# Patient Record
Sex: Female | Born: 1945
Health system: Southern US, Community
[De-identification: ages and names within clinical notes are randomized; demographics above are authoritative.]

## PROBLEM LIST (undated history)

## (undated) DIAGNOSIS — I10 Essential (primary) hypertension: Secondary | ICD-10-CM

## (undated) DIAGNOSIS — N189 Chronic kidney disease, unspecified: Secondary | ICD-10-CM

## (undated) DIAGNOSIS — B029 Zoster without complications: Secondary | ICD-10-CM

## (undated) DIAGNOSIS — K219 Gastro-esophageal reflux disease without esophagitis: Secondary | ICD-10-CM

## (undated) DIAGNOSIS — M1990 Unspecified osteoarthritis, unspecified site: Secondary | ICD-10-CM

## (undated) HISTORY — DX: Zoster without complications: B02.9

## (undated) HISTORY — PX: DIAGNOSTIC LAPAROSCOPY: SUR761

## (undated) HISTORY — PX: TUBAL LIGATION: SHX77

---

## 2001-07-01 ENCOUNTER — Emergency Department: Admission: EM | Admit: 2001-07-01 | Discharge: 2001-07-01 | Payer: Self-pay | Admitting: Emergency Medicine

## 2003-07-05 ENCOUNTER — Encounter: Admission: RE | Admit: 2003-07-05 | Discharge: 2003-07-05 | Payer: Self-pay | Admitting: Cardiology

## 2003-07-09 ENCOUNTER — Encounter: Admission: RE | Admit: 2003-07-09 | Discharge: 2003-07-09 | Payer: Self-pay | Admitting: Cardiology

## 2005-12-21 ENCOUNTER — Encounter: Admission: RE | Admit: 2005-12-21 | Discharge: 2005-12-21 | Payer: Self-pay | Admitting: Cardiology

## 2008-01-15 ENCOUNTER — Encounter: Admission: RE | Admit: 2008-01-15 | Discharge: 2008-01-15 | Payer: Self-pay | Admitting: Internal Medicine

## 2009-01-29 ENCOUNTER — Encounter: Admission: RE | Admit: 2009-01-29 | Discharge: 2009-01-29 | Payer: Self-pay | Admitting: Internal Medicine

## 2010-03-10 ENCOUNTER — Encounter
Admission: RE | Admit: 2010-03-10 | Discharge: 2010-03-10 | Payer: Self-pay | Source: Home / Self Care | Attending: Internal Medicine | Admitting: Internal Medicine

## 2011-02-21 ENCOUNTER — Ambulatory Visit (INDEPENDENT_AMBULATORY_CARE_PROVIDER_SITE_OTHER): Payer: 59

## 2011-02-21 DIAGNOSIS — L258 Unspecified contact dermatitis due to other agents: Secondary | ICD-10-CM

## 2011-02-21 DIAGNOSIS — R21 Rash and other nonspecific skin eruption: Secondary | ICD-10-CM

## 2011-02-21 DIAGNOSIS — L241 Irritant contact dermatitis due to oils and greases: Secondary | ICD-10-CM

## 2011-06-09 ENCOUNTER — Other Ambulatory Visit: Payer: Self-pay | Admitting: Internal Medicine

## 2011-06-09 DIAGNOSIS — Z1231 Encounter for screening mammogram for malignant neoplasm of breast: Secondary | ICD-10-CM

## 2011-06-25 ENCOUNTER — Ambulatory Visit
Admission: RE | Admit: 2011-06-25 | Discharge: 2011-06-25 | Disposition: A | Discharge status: Home / Self Care | Payer: 59 | Source: Ambulatory Visit | Attending: Internal Medicine | Admitting: Internal Medicine

## 2011-06-25 DIAGNOSIS — Z1231 Encounter for screening mammogram for malignant neoplasm of breast: Secondary | ICD-10-CM

## 2011-09-09 ENCOUNTER — Other Ambulatory Visit: Payer: Self-pay | Admitting: Obstetrics & Gynecology

## 2011-10-01 ENCOUNTER — Encounter (HOSPITAL_COMMUNITY): Payer: Self-pay | Admitting: Pharmacist

## 2011-10-05 ENCOUNTER — Encounter (HOSPITAL_COMMUNITY)
Admission: RE | Admit: 2011-10-05 | Discharge: 2011-10-05 | Disposition: A | Discharge status: Home / Self Care | Payer: 59 | Source: Ambulatory Visit | Attending: Obstetrics & Gynecology | Admitting: Obstetrics & Gynecology

## 2011-10-05 ENCOUNTER — Encounter (HOSPITAL_COMMUNITY): Payer: Self-pay

## 2011-10-05 HISTORY — DX: Chronic kidney disease, unspecified: N18.9

## 2011-10-05 HISTORY — DX: Unspecified osteoarthritis, unspecified site: M19.90

## 2011-10-05 HISTORY — DX: Gastro-esophageal reflux disease without esophagitis: K21.9

## 2011-10-05 HISTORY — DX: Essential (primary) hypertension: I10

## 2011-10-05 LAB — BASIC METABOLIC PANEL
BUN: 20 mg/dL (ref 6–23)
CO2: 29 mEq/L (ref 19–32)
Calcium: 9.4 mg/dL (ref 8.4–10.5)
Chloride: 102 mEq/L (ref 96–112)
Creatinine, Ser: 1.08 mg/dL (ref 0.50–1.10)
GFR calc Af Amer: 62 mL/min — ABNORMAL LOW (ref >90)
GFR calc non Af Amer: 53 mL/min — ABNORMAL LOW (ref >90)
Glucose, Bld: 89 mg/dL (ref 70–99)
Potassium: 4.2 mEq/L (ref 3.5–5.1)
Sodium: 138 mEq/L (ref 135–145)

## 2011-10-05 LAB — CBC
HCT: 36 % (ref 36.0–46.0)
Hemoglobin: 11.3 g/dL — ABNORMAL LOW (ref 12.0–15.0)
MCH: 26.2 pg (ref 26.0–34.0)
MCHC: 31.4 g/dL (ref 30.0–36.0)
MCV: 83.3 fL (ref 78.0–100.0)
Platelets: 286 10*3/uL (ref 150–400)
RBC: 4.32 MIL/uL (ref 3.87–5.11)
RDW: 14.9 % (ref 11.5–15.5)
WBC: 7.1 10*3/uL (ref 4.0–10.5)

## 2011-10-05 NOTE — Patient Instructions (Addendum)
21 Vanessa Ward  10/05/2011   Your procedure is scheduled on:  10/15/11"  Enter through the Main Entrance of [facility] at 12:30PM  Pick up the phone at the desk and dial [phone].   Call this number if you have problems the morning of surgery: [phone]   Remember:   Do not eat food:After Midnight.  Do not drink clear liquids: 4 Hours before arrival.  Take these medicines the morning of surgery with A SIP OF WATER: Blood Pressure medication and Prilosec   Do not wear jewelry, make-up or nail polish.  Do not wear lotions, powders, or perfumes. You may wear deodorant.  Do not shave 48 hours prior to surgery.  Do not bring valuables to the hospital.  Contacts, dentures or bridgework may not be worn into surgery.  Leave suitcase in the car. After surgery it may be brought to your room.  For patients admitted to the hospital, checkout time is 11:00 AM the day of discharge.   Patients discharged the day of surgery will not be allowed to drive home.  Name and phone number of your driver: daughter  Vanessa Ward  Special Instructions: CHG Shower Use Special Wash: 1/2 bottle night before surgery and 1/2 bottle morning of surgery.   Please read over the following fact sheets that you were given: Surgical Site Infection Prevention

## 2011-10-15 ENCOUNTER — Encounter (HOSPITAL_COMMUNITY): Payer: Self-pay | Admitting: Anesthesiology

## 2011-10-15 ENCOUNTER — Ambulatory Visit (HOSPITAL_COMMUNITY): Payer: 59 | Admitting: Anesthesiology

## 2011-10-15 ENCOUNTER — Ambulatory Visit (HOSPITAL_COMMUNITY)
Admission: AD | Admit: 2011-10-15 | Discharge: 2011-10-15 | Disposition: A | Discharge status: Home / Self Care | Payer: 59 | Source: Ambulatory Visit | Attending: Obstetrics & Gynecology | Admitting: Obstetrics & Gynecology

## 2011-10-15 ENCOUNTER — Encounter (HOSPITAL_COMMUNITY)
Admission: AD | Disposition: A | Discharge status: Home / Self Care | Payer: Self-pay | Source: Ambulatory Visit | Attending: Obstetrics & Gynecology

## 2011-10-15 DIAGNOSIS — N841 Polyp of cervix uteri: Secondary | ICD-10-CM | POA: Diagnosis present

## 2011-10-15 DIAGNOSIS — R87612 Low grade squamous intraepithelial lesion on cytologic smear of cervix (LGSIL): Secondary | ICD-10-CM

## 2011-10-15 DIAGNOSIS — L919 Hypertrophic disorder of the skin, unspecified: Secondary | ICD-10-CM | POA: Insufficient documentation

## 2011-10-15 DIAGNOSIS — N870 Mild cervical dysplasia: Secondary | ICD-10-CM | POA: Insufficient documentation

## 2011-10-15 DIAGNOSIS — L909 Atrophic disorder of skin, unspecified: Secondary | ICD-10-CM | POA: Insufficient documentation

## 2011-10-15 HISTORY — PX: COLPOSCOPY: SHX161

## 2011-10-15 HISTORY — PX: BIOPSY: SHX5522

## 2011-10-15 SURGERY — COLPOSCOPY
Anesthesia: General | Site: Vagina | Wound class: Clean Contaminated

## 2011-10-15 MED ORDER — ACETIC ACID 4% SOLUTION
Status: DC | PRN
  Administered 2011-10-15: 1 via TOPICAL

## 2011-10-15 MED ORDER — IODINE STRONG (LUGOLS) 5 % PO SOLN
ORAL | Status: DC | PRN
  Administered 2011-10-15: 0.1 mL via ORAL

## 2011-10-15 MED ORDER — FENTANYL CITRATE 0.05 MG/ML IJ SOLN: 25.0000 ug | INTRAMUSCULAR | Status: DC | PRN

## 2011-10-15 MED ORDER — ONDANSETRON HCL 4 MG/2ML IJ SOLN
INTRAMUSCULAR | Status: AC
  Filled 2011-10-15: qty 2

## 2011-10-15 MED ORDER — FENTANYL CITRATE 0.05 MG/ML IJ SOLN
INTRAMUSCULAR | Status: AC
  Filled 2011-10-15: qty 2

## 2011-10-15 MED ORDER — EPHEDRINE SULFATE 50 MG/ML IJ SOLN
INTRAMUSCULAR | Status: DC | PRN
  Administered 2011-10-15 (×2): 10 mg via INTRAVENOUS

## 2011-10-15 MED ORDER — LIDOCAINE-EPINEPHRINE 2 %-1:100000 IJ SOLN
INTRAMUSCULAR | Status: AC
  Filled 2011-10-15: qty 1

## 2011-10-15 MED ORDER — LIDOCAINE HCL 1 % IJ SOLN
INTRAMUSCULAR | Status: DC | PRN
  Administered 2011-10-15: 5 mL

## 2011-10-15 MED ORDER — LIDOCAINE HCL (CARDIAC) 20 MG/ML IV SOLN
INTRAVENOUS | Status: AC
  Filled 2011-10-15: qty 5

## 2011-10-15 MED ORDER — BACITRACIN-NEOMYCIN-POLYMYXIN 400-5-5000 EX OINT
TOPICAL_OINTMENT | CUTANEOUS | Status: AC
  Filled 2011-10-15: qty 1

## 2011-10-15 MED ORDER — LACTATED RINGERS IV SOLN
INTRAVENOUS | Status: DC
  Administered 2011-10-15: 14:00:00 via INTRAVENOUS

## 2011-10-15 MED ORDER — FERRIC SUBSULFATE SOLN
Status: DC | PRN
  Administered 2011-10-15: 1

## 2011-10-15 MED ORDER — FENTANYL CITRATE 0.05 MG/ML IJ SOLN
INTRAMUSCULAR | Status: DC | PRN
  Administered 2011-10-15: 50 ug via INTRAVENOUS

## 2011-10-15 MED ORDER — MIDAZOLAM HCL 5 MG/5ML IJ SOLN
INTRAMUSCULAR | Status: DC | PRN
  Administered 2011-10-15: 1 mg via INTRAVENOUS

## 2011-10-15 MED ORDER — SILVER NITRATE-POT NITRATE 75-25 % EX MISC
CUTANEOUS | Status: AC
  Filled 2011-10-15: qty 1

## 2011-10-15 MED ORDER — MIDAZOLAM HCL 2 MG/2ML IJ SOLN
INTRAMUSCULAR | Status: AC
  Filled 2011-10-15: qty 2

## 2011-10-15 MED ORDER — LIDOCAINE HCL (CARDIAC) 20 MG/ML IV SOLN
INTRAVENOUS | Status: DC | PRN
  Administered 2011-10-15: 50 mg via INTRAVENOUS

## 2011-10-15 MED ORDER — ONDANSETRON HCL 4 MG/2ML IJ SOLN
INTRAMUSCULAR | Status: DC | PRN
  Administered 2011-10-15: 4 mg via INTRAVENOUS

## 2011-10-15 MED ORDER — VASOPRESSIN 20 UNIT/ML IJ SOLN
INTRAMUSCULAR | Status: AC
  Filled 2011-10-15: qty 1

## 2011-10-15 MED ORDER — PROPOFOL 10 MG/ML IV EMUL
INTRAVENOUS | Status: DC | PRN
  Administered 2011-10-15: 200 via INTRAVENOUS
  Administered 2011-10-15: 200 mg via INTRAVENOUS

## 2011-10-15 MED ORDER — PROPOFOL 10 MG/ML IV EMUL
INTRAVENOUS | Status: AC
  Filled 2011-10-15: qty 20

## 2011-10-15 MED ORDER — ACETAMINOPHEN-CODEINE #3 300-30 MG PO TABS: 1.0000 | ORAL_TABLET | Freq: Four times a day (QID) | ORAL | Status: AC | PRN

## 2011-10-15 MED FILL — Propofol IV Emul 200 MG/20ML (10 MG/ML): INTRAVENOUS | Qty: 20 | Status: AC

## 2011-10-15 SURGICAL SUPPLY — 28 items
APPLICATOR COTTON TIP 6IN STRL (MISCELLANEOUS) IMPLANT
BLADE SURG 10 STRL SS (BLADE) ×4 IMPLANT
CLOTH BEACON ORANGE TIMEOUT ST (SAFETY) ×4 IMPLANT
CONTAINER PREFILL 10% NBF 15ML (MISCELLANEOUS) ×8 IMPLANT
CONTAINER PREFILL 10% NBF 60ML (FORM) ×4 IMPLANT
COUNTER NEEDLE 1200 MAGNETIC (NEEDLE) IMPLANT
ELECT REM PT RETURN 9FT ADLT (ELECTROSURGICAL)
ELECTRODE REM PT RTRN 9FT ADLT (ELECTROSURGICAL) IMPLANT
GAUZE SPONGE 4X4 16PLY XRAY LF (GAUZE/BANDAGES/DRESSINGS) IMPLANT
GLOVE BIO SURGEON STRL SZ 6.5 (GLOVE) ×8 IMPLANT
GOWN PREVENTION PLUS LG XLONG (DISPOSABLE) ×8 IMPLANT
HEMOSTAT SURGICEL 2X3 (HEMOSTASIS) IMPLANT
HEMOSTAT SURGICEL 4X8 (HEMOSTASIS) IMPLANT
NDL SPNL 22GX3.5 QUINCKE BK (NEEDLE) ×2 IMPLANT
NEEDLE SPNL 22GX3.5 QUINCKE BK (NEEDLE) ×4 IMPLANT
NS IRRIG 1000ML POUR BTL (IV SOLUTION) ×4 IMPLANT
PACK VAGINAL MINOR WOMEN LF (CUSTOM PROCEDURE TRAY) ×4 IMPLANT
PENCIL BUTTON HOLSTER BLD 10FT (ELECTRODE) IMPLANT
SCOPETTES 8  STERILE (MISCELLANEOUS) ×2
SCOPETTES 8 STERILE (MISCELLANEOUS) ×6 IMPLANT
SPONGE SURGIFOAM ABS GEL 12-7 (HEMOSTASIS) IMPLANT
STRIP CLOSURE SKIN 1/4X3 (GAUZE/BANDAGES/DRESSINGS) ×2 IMPLANT
SUT CHROMIC 2 0 CT 1 (SUTURE) IMPLANT
SUT VIC AB 1 CT1 36 (SUTURE) IMPLANT
SYR CONTROL 10ML LL (SYRINGE) ×4 IMPLANT
SYR TB 1ML 25GX5/8 (SYRINGE) IMPLANT
TOWEL OR 17X24 6PK STRL BLUE (TOWEL DISPOSABLE) ×8 IMPLANT
WATER STERILE IRR 1000ML POUR (IV SOLUTION) ×4 IMPLANT

## 2011-10-15 NOTE — H&P (Signed)
  Chief Complaint: 66 y.o.  who presents with CIN 1 in a cervical polyp  Details of Present Illness: A recent Pap showed LSIL with cells suggestive of HSIL.  A biopsy was taken of the sessile polyp--the pathology was diagnostic of CIN 1  BP 111/60  Pulse 72  Temp 98.1 F (36.7 C) (Oral)  Resp 16  Ht 5\' 4"  (1.626 m)  Wt 99.791 kg (220 lb)  BMI 37.76 kg/m2  SpO2 99%  Past Medical History  Diagnosis Date  . Hypertension   . GERD (gastroesophageal reflux disease)   . Chronic kidney disease     resent diagnosis of hematuria due to benigh kidney tumor  . Arthritis    History   Social History  . Marital Status: Single    Spouse Name: N/A    Number of Children: N/A  . Years of Education: N/A   Occupational History  . Not on file.   Social History Main Topics  . Smoking status: Former Games developer  . Smokeless tobacco: Not on file  . Alcohol Use: Yes     rarely  . Drug Use: No  . Sexually Active:    Other Topics Concern  . Not on file   Social History Narrative  . No narrative on file   No family history on file.  Pertinent items are noted in HPI.  Pre-Op Diagnosis: Cervical Polyp; Low Grade Squamous Intraepithelial Lesion Pap Smear   Planned Procedure: Procedure(s): Removal of cervical polyp COLPOSCOPY with biopsies  I have reviewed the patient's history and have completed the physical exam and Vanessa Ward is acceptable for surgery.  Roseanna Rainbow, MD 10/15/2011 1:31 PM

## 2011-10-15 NOTE — Transfer of Care (Signed)
Immediate Anesthesia Transfer of Care Note  Patient: Vanessa Ward  Procedure(s) Performed: Procedure(s) (LRB): COLPOSCOPY (N/A) BIOPSY () EXCISION OF SKIN TAG (N/A)  Patient Location: PACU  Anesthesia Type: General  Level of Consciousness: awake, alert  and oriented  Airway & Oxygen Therapy: Patient Spontanous Breathing and Patient connected to nasal cannula oxygen  Post-op Assessment: Report given to PACU RN and Post -op Vital signs reviewed and stable  Post vital signs: Reviewed and stable  Complications: No apparent anesthesia complications

## 2011-10-15 NOTE — Op Note (Signed)
Procedure(s): COLPOSCOPY BIOPSY EXCISION OF SKIN TAG Procedure Note  [name] female 66 y.o. 10/15/2011  Procedure(s) and Anesthesia Type:    * COLPOSCOPY - General    * BIOPSY - General    * EXCISION OF SKIN TAG - General  Surgeon(s) and Role:    * Antionette Char, MD - Primary   Indications: The patient now presents for colposcopic-directed biopsies/cervical polyp removal/removal of skin tag.      Surgeon: Antionette Char A     Anesthesia: Local anesthesia 1% plain lidocaine and LMA      Procedure Detail  COLPOSCOPY, BIOPSY, EXCISION OF SKIN TAG  The patient was brought to the OR with an IV running. A laryngeal mask airway was placed.   She positioned in semi-lithotomy. She was prepped and draped in usual fashion.  A time-out was performed confirming the patient, Vanessa Ward procedures and allergy status.  A Graves speculum was placed into the vagina.  The cervix was infiltrated with 1% lidocaine.  Lugols solution was applied to the cervix.  A colposcopic exam was performed with the findings described below.  The anterior and posterior biopsies were taken with a scalpel.  Biopsies were taken at 3 and 9 o'clock with the biopsy forceps.  An endocervical curettage was performed.  Monsel's was applied to the biopsy sites.  Adequate hemostasis was noted.  The speculum was removed.  The skin tag was grasped and excised with a scalpel.  Silver nitrate was applied.  A the close of the procedure the instrument and needle counts were correct x 2.  The patient was awakened from anesthesia and transferred to the PACU in stable condition.  Findings: The sessile polyp had previously been fractured during attempts at removal in the office.  The base was not present at the time of surgery.  Colposcopy was performed using Lugol's solution. The exam was felt to be satisfactory.  There were focal Lugol's negative areas anterior and posterior. There was a 5 mm skin tag on the  right vulva.  Estimated Blood Loss:  Minimal                  Total IV Fluids: per Anesthesiology           Specimens: To pathology                 Complications:  none         Disposition: PACU - hemodynamically stable.         Condition: stable

## 2011-10-15 NOTE — Anesthesia Preprocedure Evaluation (Addendum)
Anesthesia Evaluation  Patient identified by MRN, date of birth, ID band Patient awake    Reviewed: Allergy & Precautions, H&P , Patient's Chart, lab work & pertinent test results, reviewed documented beta blocker date and time   Airway Mallampati: III TM Distance: >3 FB Neck ROM: full    Dental No notable dental hx.    Pulmonary  breath sounds clear to auscultation  Pulmonary exam normal       Cardiovascular hypertension (well controlled  EKG WNL), Pt. on medications Rhythm:regular Rate:Normal     Neuro/Psych    GI/Hepatic GERD-  Medicated and Controlled,  Endo/Other    Renal/GU      Musculoskeletal   Abdominal   Peds  Hematology   Anesthesia Other Findings   Reproductive/Obstetrics                          Anesthesia Physical Anesthesia Plan  ASA: III  Anesthesia Plan: General   Post-op Pain Management:    Induction: Intravenous  Airway Management Planned: Oral ETT  Additional Equipment:   Intra-op Plan:   Post-operative Plan:   Informed Consent: I have reviewed the patients History and Physical, chart, labs and discussed the procedure including the risks, benefits and alternatives for the proposed anesthesia with the patient or authorized representative who has indicated his/her understanding and acceptance.   Dental Advisory Given and Dental advisory given  Plan Discussed with: CRNA and Surgeon  Anesthesia Plan Comments: (  Discussed  general anesthesia, including possible nausea, instrumentation of airway, sore throat,pulmonary aspiration, etc. I asked if the were any outstanding questions, or  concerns before we proceeded. )        Anesthesia Quick Evaluation

## 2011-10-16 NOTE — Anesthesia Postprocedure Evaluation (Signed)
  Anesthesia Post-op Note  Patient: Vanessa Ward  Procedure(s) Performed: Procedure(s) (LRB): COLPOSCOPY (N/A) BIOPSY () EXCISION OF SKIN TAG (N/A) Patient is awake and responsive. Pain and nausea are reasonably well controlled. Vital signs are stable and clinically acceptable. Oxygen saturation is clinically acceptable. There are no apparent anesthetic complications at this time. Patient is ready for discharge.

## 2011-10-18 ENCOUNTER — Encounter (HOSPITAL_COMMUNITY): Payer: Self-pay | Admitting: Obstetrics & Gynecology

## 2011-10-27 ENCOUNTER — Other Ambulatory Visit: Payer: Self-pay | Admitting: Obstetrics & Gynecology

## 2011-12-03 ENCOUNTER — Encounter (HOSPITAL_COMMUNITY): Payer: Self-pay

## 2011-12-03 DIAGNOSIS — D3000 Benign neoplasm of unspecified kidney: Secondary | ICD-10-CM | POA: Diagnosis not present

## 2011-12-03 DIAGNOSIS — N390 Urinary tract infection, site not specified: Secondary | ICD-10-CM | POA: Diagnosis not present

## 2011-12-03 DIAGNOSIS — R3129 Other microscopic hematuria: Secondary | ICD-10-CM | POA: Diagnosis not present

## 2011-12-13 ENCOUNTER — Encounter (HOSPITAL_COMMUNITY)
Admission: RE | Admit: 2011-12-13 | Discharge: 2011-12-13 | Disposition: A | Discharge status: Home / Self Care | Payer: Medicare Other | Source: Ambulatory Visit | Attending: Obstetrics & Gynecology | Admitting: Obstetrics & Gynecology

## 2011-12-13 ENCOUNTER — Encounter (HOSPITAL_COMMUNITY): Payer: Self-pay

## 2011-12-13 DIAGNOSIS — Z01812 Encounter for preprocedural laboratory examination: Secondary | ICD-10-CM | POA: Diagnosis not present

## 2011-12-13 DIAGNOSIS — Z01818 Encounter for other preprocedural examination: Secondary | ICD-10-CM | POA: Diagnosis not present

## 2011-12-13 DIAGNOSIS — N871 Moderate cervical dysplasia: Secondary | ICD-10-CM | POA: Diagnosis not present

## 2011-12-13 LAB — BASIC METABOLIC PANEL
BUN: 20 mg/dL (ref 6–23)
CO2: 29 mEq/L (ref 19–32)
Calcium: 9.4 mg/dL (ref 8.4–10.5)
Chloride: 100 mEq/L (ref 96–112)
Creatinine, Ser: 1 mg/dL (ref 0.50–1.10)
GFR calc Af Amer: 67 mL/min — ABNORMAL LOW (ref >90)
GFR calc non Af Amer: 58 mL/min — ABNORMAL LOW (ref >90)
Glucose, Bld: 89 mg/dL (ref 70–99)
Potassium: 3.6 mEq/L (ref 3.5–5.1)
Sodium: 138 mEq/L (ref 135–145)

## 2011-12-13 LAB — CBC
HCT: 35.3 % — ABNORMAL LOW (ref 36.0–46.0)
Hemoglobin: 11.2 g/dL — ABNORMAL LOW (ref 12.0–15.0)
MCH: 26.2 pg (ref 26.0–34.0)
MCHC: 31.7 g/dL (ref 30.0–36.0)
MCV: 82.5 fL (ref 78.0–100.0)
Platelets: 275 10*3/uL (ref 150–400)
RBC: 4.28 MIL/uL (ref 3.87–5.11)
RDW: 14.8 % (ref 11.5–15.5)
WBC: 7.1 10*3/uL (ref 4.0–10.5)

## 2011-12-13 NOTE — Patient Instructions (Signed)
Your procedure is scheduled on:12/17/11  Enter through the Main Entrance at :1130 am Pick up desk phone and dial [phone] and inform us of your arrival.  Please call [phone] if you have any problems the morning of surgery.  Remember: Do not eat after midnight: Thursday Water ok until 9am  Take these meds the morning of surgery with a sip of water:Lisinopril, Prilosec  DO NOT wear jewelry, eye make-up, lipstick,body lotion, or dark fingernail polish. Do not shave for 48 hours prior to surgery.  Patients discharged on the day of surgery will not be allowed to drive home.

## 2011-12-17 ENCOUNTER — Ambulatory Visit (HOSPITAL_COMMUNITY): Payer: Medicare Other | Admitting: Anesthesiology

## 2011-12-17 ENCOUNTER — Ambulatory Visit (HOSPITAL_COMMUNITY)
Admission: RE | Admit: 2011-12-17 | Discharge: 2011-12-17 | Disposition: A | Discharge status: Home / Self Care | Payer: Medicare Other | Source: Ambulatory Visit | Attending: Obstetrics & Gynecology | Admitting: Obstetrics & Gynecology

## 2011-12-17 ENCOUNTER — Encounter (HOSPITAL_COMMUNITY)
Admission: RE | Disposition: A | Discharge status: Home / Self Care | Payer: Self-pay | Source: Ambulatory Visit | Attending: Obstetrics & Gynecology

## 2011-12-17 ENCOUNTER — Encounter (HOSPITAL_COMMUNITY): Payer: Self-pay | Admitting: Obstetrics & Gynecology

## 2011-12-17 ENCOUNTER — Encounter (HOSPITAL_COMMUNITY): Payer: Self-pay | Admitting: Anesthesiology

## 2011-12-17 DIAGNOSIS — Z01818 Encounter for other preprocedural examination: Secondary | ICD-10-CM | POA: Insufficient documentation

## 2011-12-17 DIAGNOSIS — K219 Gastro-esophageal reflux disease without esophagitis: Secondary | ICD-10-CM | POA: Diagnosis not present

## 2011-12-17 DIAGNOSIS — N189 Chronic kidney disease, unspecified: Secondary | ICD-10-CM | POA: Diagnosis not present

## 2011-12-17 DIAGNOSIS — I10 Essential (primary) hypertension: Secondary | ICD-10-CM | POA: Diagnosis not present

## 2011-12-17 DIAGNOSIS — N879 Dysplasia of cervix uteri, unspecified: Secondary | ICD-10-CM | POA: Diagnosis not present

## 2011-12-17 DIAGNOSIS — Z01812 Encounter for preprocedural laboratory examination: Secondary | ICD-10-CM | POA: Insufficient documentation

## 2011-12-17 DIAGNOSIS — N871 Moderate cervical dysplasia: Secondary | ICD-10-CM | POA: Diagnosis present

## 2011-12-17 HISTORY — PX: CERVICAL CONIZATION W/BX: SHX1330

## 2011-12-17 SURGERY — CONE BIOPSY, CERVIX
Anesthesia: General | Site: Vagina | Wound class: Clean Contaminated

## 2011-12-17 MED ORDER — MIDAZOLAM HCL 5 MG/5ML IJ SOLN
INTRAMUSCULAR | Status: DC | PRN
  Administered 2011-12-17: 1 mg via INTRAVENOUS

## 2011-12-17 MED ORDER — PHENYLEPHRINE 40 MCG/ML (10ML) SYRINGE FOR IV PUSH (FOR BLOOD PRESSURE SUPPORT)
PREFILLED_SYRINGE | INTRAVENOUS | Status: AC
  Filled 2011-12-17: qty 5

## 2011-12-17 MED ORDER — KETOROLAC TROMETHAMINE 30 MG/ML IJ SOLN: 15.0000 mg | Freq: Once | INTRAMUSCULAR | Status: DC | PRN

## 2011-12-17 MED ORDER — FENTANYL CITRATE 0.05 MG/ML IJ SOLN: 25.0000 ug | INTRAMUSCULAR | Status: DC | PRN

## 2011-12-17 MED ORDER — FENTANYL CITRATE 0.05 MG/ML IJ SOLN
INTRAMUSCULAR | Status: DC | PRN
  Administered 2011-12-17 (×2): 50 ug via INTRAVENOUS

## 2011-12-17 MED ORDER — SODIUM CHLORIDE 0.9 % IJ SOLN: 3.0000 mL | INTRAMUSCULAR | Status: DC | PRN

## 2011-12-17 MED ORDER — VASOPRESSIN 20 UNIT/ML IJ SOLN
INTRAMUSCULAR | Status: AC
  Filled 2011-12-17: qty 1

## 2011-12-17 MED ORDER — ONDANSETRON HCL 4 MG/2ML IJ SOLN
INTRAMUSCULAR | Status: DC | PRN
  Administered 2011-12-17: 4 mg via INTRAVENOUS

## 2011-12-17 MED ORDER — MIDAZOLAM HCL 2 MG/2ML IJ SOLN
INTRAMUSCULAR | Status: AC
  Filled 2011-12-17: qty 2

## 2011-12-17 MED ORDER — LACTATED RINGERS IV SOLN
INTRAVENOUS | Status: DC
  Administered 2011-12-17: 125 mL/h via INTRAVENOUS
  Administered 2011-12-17: 14:00:00 via INTRAVENOUS

## 2011-12-17 MED ORDER — OXYCODONE HCL 5 MG PO TABS: 5.0000 mg | ORAL_TABLET | ORAL | Status: DC | PRN

## 2011-12-17 MED ORDER — SODIUM CHLORIDE 0.9 % IV SOLN: 250.0000 mL | INTRAVENOUS | Status: DC | PRN

## 2011-12-17 MED ORDER — ACETIC ACID 4% SOLUTION
Status: DC | PRN
  Administered 2011-12-17: 1 via TOPICAL

## 2011-12-17 MED ORDER — ACETAMINOPHEN 325 MG PO TABS: 650.0000 mg | ORAL_TABLET | ORAL | Status: DC | PRN

## 2011-12-17 MED ORDER — FENTANYL CITRATE 0.05 MG/ML IJ SOLN
INTRAMUSCULAR | Status: AC
  Filled 2011-12-17: qty 5

## 2011-12-17 MED ORDER — LIDOCAINE HCL (CARDIAC) 20 MG/ML IV SOLN
INTRAVENOUS | Status: DC | PRN
  Administered 2011-12-17: 60 mg via INTRAVENOUS

## 2011-12-17 MED ORDER — EPHEDRINE SULFATE 50 MG/ML IJ SOLN
INTRAMUSCULAR | Status: DC | PRN
  Administered 2011-12-17: 10 mg via INTRAVENOUS

## 2011-12-17 MED ORDER — ACETAMINOPHEN 650 MG RE SUPP
650.0000 mg | RECTAL | Status: DC | PRN
  Filled 2011-12-17: qty 1

## 2011-12-17 MED ORDER — PHENYLEPHRINE HCL 10 MG/ML IJ SOLN
INTRAMUSCULAR | Status: DC | PRN
  Administered 2011-12-17 (×2): 40 ug via INTRAVENOUS

## 2011-12-17 MED ORDER — EPHEDRINE 5 MG/ML INJ
INTRAVENOUS | Status: AC
  Filled 2011-12-17: qty 10

## 2011-12-17 MED ORDER — PROPOFOL 10 MG/ML IV EMUL
INTRAVENOUS | Status: DC | PRN
  Administered 2011-12-17: 50 mg via INTRAVENOUS
  Administered 2011-12-17: 150 mg via INTRAVENOUS

## 2011-12-17 MED ORDER — SODIUM CHLORIDE 0.9 % IJ SOLN: 3.0000 mL | Freq: Two times a day (BID) | INTRAMUSCULAR | Status: DC

## 2011-12-17 MED ORDER — BUPIVACAINE HCL (PF) 0.25 % IJ SOLN
INTRAMUSCULAR | Status: AC
  Filled 2011-12-17: qty 30

## 2011-12-17 MED ORDER — BUPIVACAINE HCL 0.25 % IJ SOLN
INTRAMUSCULAR | Status: DC | PRN
  Administered 2011-12-17: 10 mL

## 2011-12-17 MED ORDER — ONDANSETRON HCL 4 MG/2ML IJ SOLN: 4.0000 mg | Freq: Four times a day (QID) | INTRAMUSCULAR | Status: DC | PRN

## 2011-12-17 SURGICAL SUPPLY — 27 items
APPLICATOR COTTON TIP 6IN STRL (MISCELLANEOUS) IMPLANT
BLADE SURG 10 STRL SS (BLADE) ×2 IMPLANT
CATH ROBINSON RED A/P 16FR (CATHETERS) ×1 IMPLANT
CLOTH BEACON ORANGE TIMEOUT ST (SAFETY) ×2 IMPLANT
CONTAINER PREFILL 10% NBF 60ML (FORM) ×3 IMPLANT
COUNTER NEEDLE 1200 MAGNETIC (NEEDLE) ×1 IMPLANT
DRESSING TELFA 8X3 (GAUZE/BANDAGES/DRESSINGS) ×2 IMPLANT
ELECT BALL LEEP 5MM RED (ELECTRODE) ×1 IMPLANT
ELECT REM PT RETURN 9FT ADLT (ELECTROSURGICAL) ×2
ELECTRODE REM PT RTRN 9FT ADLT (ELECTROSURGICAL) IMPLANT
GLOVE BIO SURGEON STRL SZ 6.5 (GLOVE) ×4 IMPLANT
GOWN PREVENTION PLUS LG XLONG (DISPOSABLE) ×4 IMPLANT
NDL SPNL 22GX3.5 QUINCKE BK (NEEDLE) ×1 IMPLANT
NEEDLE HYPO 22GX1.5 SAFETY (NEEDLE) ×1 IMPLANT
NEEDLE SPNL 22GX3.5 QUINCKE BK (NEEDLE) ×4 IMPLANT
NS IRRIG 1000ML POUR BTL (IV SOLUTION) ×2 IMPLANT
PACK VAGINAL MINOR WOMEN LF (CUSTOM PROCEDURE TRAY) ×2 IMPLANT
PAD OB MATERNITY 4.3X12.25 (PERSONAL CARE ITEMS) ×2 IMPLANT
PENCIL BUTTON HOLSTER BLD 10FT (ELECTRODE) ×1 IMPLANT
SCOPETTES 8  STERILE (MISCELLANEOUS) ×2
SCOPETTES 8 STERILE (MISCELLANEOUS) ×2 IMPLANT
SPONGE SURGIFOAM ABS GEL 12-7 (HEMOSTASIS) ×1 IMPLANT
SUT CHROMIC 2 0 CT 1 (SUTURE) IMPLANT
SUT VIC AB 1 CT1 36 (SUTURE) ×2 IMPLANT
SYR CONTROL 10ML LL (SYRINGE) ×2 IMPLANT
TOWEL OR 17X24 6PK STRL BLUE (TOWEL DISPOSABLE) ×4 IMPLANT
WATER STERILE IRR 1000ML POUR (IV SOLUTION) ×2 IMPLANT

## 2011-12-17 NOTE — Anesthesia Postprocedure Evaluation (Signed)
Anesthesia Post Note  Patient: Vanessa Ward  Procedure(s) Performed: Procedure(s) (LRB): CONIZATION CERVIX WITH BIOPSY (N/A)  Anesthesia type: GA  Patient location: PACU  Post pain: Pain level controlled  Post assessment: Post-op Vital signs reviewed  Last Vitals:  Filed Vitals:   12/17/11 1400  BP:   Pulse: 64  Temp:   Resp:     Post vital signs: Reviewed  Level of consciousness: sedated  Complications: No apparent anesthesia complications

## 2011-12-17 NOTE — Op Note (Addendum)
PREOPERATIVE DIAGNOSIS: Cervical dysplasia.  POSTOPERATIVE DIAGNOSIS: Cervical dysplasia.  OPERATION PERFORMED: Cold-knife conization and endocervical curetting.        SURGEON:  JACKSON-MOORE,LISA A  ANESTHESIA: Laryngeal mask airway.  COMPLICATIONS: None.  ESTIMATED BLOOD LOSS: 20 mL.  SPECIMENS: Cold-knife cone specimen and endocervical curettings.  INDICATION: This is a patient with a history of  CIN 2 diagnosed by colposcopy and biopsy. The patient was counseled regarding options for excisional therapy. The patient understood risks, benefits and alternatives to the procedure, and informed consent was signed for cold-knife conization. All questions were answered prior to the procedure.  DESCRIPTION OF OPERATION: The patient was taken to the operating room, where LMA anesthesia was obtained without difficulty. The patient was prepped and draped in the normal sterile fashion in the dorsal lithotomy position using Allen stirrups.  A timeout was performed confirming the patient, procedure, allergy and antibiotic status.   Attention was then turned to the patient's vagina where the weighted speculum was placed into the posterior fornix and a curved Deaver was placed into the anterior fornix. Two sutures of 0 Vicryl were used to ligate the cervical branches of the cervical artery at the 3 and 9 o'clock positions in a figure-of-eight suture. Then, the cervix was injected with 10 ml of 0.25% Marcaine. Lugol solution was applied to the cervix to see any abnormalities. Cold-knife cone specimen was then obtained. This was handed off for pathologic review. Stitch was placed at the 12 o'clock position. Next, endocervical curetting was performed of the canal. This was handed off also for pathologic review. The base of the cervical cone specimen was then cauterized using Bovie cautery. The margins of the cone specimen were also cauterized in a similar manner. The cone site was noted to be hemostatic. U  sutures  Of 0 Vicryl were placed anteriorly and posteriorly.  Gelfoam was then placed into the cervical cone site, the sutures were cut and all the instruments removed from the patient's vagina. All sponge, lap and needle counts were correct x2. The patient tolerated the procedure well, was awakened and transferred to the recovery room.

## 2011-12-17 NOTE — Anesthesia Preprocedure Evaluation (Signed)
Anesthesia Evaluation  Patient identified by MRN, date of birth, ID band Patient awake    Reviewed: Allergy & Precautions, H&P , NPO status , Patient's Chart, lab work & pertinent test results, reviewed documented beta blocker date and time   History of Anesthesia Complications Negative for: history of anesthetic complications  Airway Mallampati: III TM Distance: >3 FB Neck ROM: full    Dental  (+) Teeth Intact   Pulmonary Recent URI  (cough this morning.  no fever. nonproductive),  breath sounds clear to auscultation  Pulmonary exam normal       Cardiovascular hypertension, On Medications Rhythm:regular Rate:Normal     Neuro/Psych negative neurological ROS  negative psych ROS   GI/Hepatic Neg liver ROS, GERD-  Medicated,  Endo/Other  Morbid obesity  Renal/GU Renal disease (hematuria from benign kidney tumor)  Female GU complaint     Musculoskeletal  (+) Arthritis -,   Abdominal   Peds  Hematology negative hematology ROS (+)   Anesthesia Other Findings   Reproductive/Obstetrics negative OB ROS                           Anesthesia Physical Anesthesia Plan  ASA: III  Anesthesia Plan: General LMA   Post-op Pain Management:    Induction:   Airway Management Planned:   Additional Equipment:   Intra-op Plan:   Post-operative Plan:   Informed Consent: I have reviewed the patients History and Physical, chart, labs and discussed the procedure including the risks, benefits and alternatives for the proposed anesthesia with the patient or authorized representative who has indicated his/her understanding and acceptance.   Dental Advisory Given  Plan Discussed with: CRNA and Surgeon  Anesthesia Plan Comments:         Anesthesia Quick Evaluation

## 2011-12-17 NOTE — Transfer of Care (Signed)
Immediate Anesthesia Transfer of Care Note  Patient: Vanessa Ward  Procedure(s) Performed: Procedure(s) (LRB) with comments: CONIZATION CERVIX WITH BIOPSY (N/A)  Patient Location: PACU  Anesthesia Type:General  Level of Consciousness: awake, alert  and oriented  Airway & Oxygen Therapy: Patient Spontanous Breathing and Patient connected to nasal cannula oxygen  Post-op Assessment: Report given to PACU RN and Post -op Vital signs reviewed and stable  Post vital signs: stable  Complications: No apparent anesthesia complications

## 2011-12-17 NOTE — H&P (Signed)
  Chief Complaint: 66 y.o. gravida  who presents for a cold knife conization.  Details of Present Illness: Recent colposcopic-directed biopsies were diagnostic of high grade dysplasia.  Endocervical curettings were negative.   Ht 5\' 4"  (1.626 m)  Wt 95.255 kg (210 lb)  BMI 36.05 kg/m2  Past Medical History  Diagnosis Date  . Hypertension   . GERD (gastroesophageal reflux disease)   . Arthritis   . Chronic kidney disease     resent diagnosis of hematuria due to benigh kidney tumor   History   Social History  . Marital Status: Single    Spouse Name: N/A    Number of Children: N/A  . Years of Education: N/A   Occupational History  . Not on file.   Social History Main Topics  . Smoking status: Former Games developer  . Smokeless tobacco: Not on file  . Alcohol Use: Yes     rarely  . Drug Use: No  . Sexually Active:    Other Topics Concern  . Not on file   Social History Narrative  . No narrative on file   History reviewed. No pertinent family history.  Pertinent items are noted in HPI.  Pre-Op Diagnosis: CERVICAL DYSPLASIA   Planned Procedure: Procedure(s): CONIZATION CERVIX WITH BIOPSY  I have reviewed the patient's history and have completed the physical exam and Ziyanna T Mccreery is acceptable for surgery.  Roseanna Rainbow, MD 12/17/2011 9:15 AM

## 2011-12-20 ENCOUNTER — Encounter (HOSPITAL_COMMUNITY): Payer: Self-pay | Admitting: Obstetrics & Gynecology

## 2012-06-12 DIAGNOSIS — Z23 Encounter for immunization: Secondary | ICD-10-CM | POA: Diagnosis not present

## 2012-06-12 DIAGNOSIS — N182 Chronic kidney disease, stage 2 (mild): Secondary | ICD-10-CM | POA: Diagnosis not present

## 2012-06-12 DIAGNOSIS — Z79899 Other long term (current) drug therapy: Secondary | ICD-10-CM | POA: Diagnosis not present

## 2012-06-12 DIAGNOSIS — E559 Vitamin D deficiency, unspecified: Secondary | ICD-10-CM | POA: Diagnosis not present

## 2012-06-12 DIAGNOSIS — I129 Hypertensive chronic kidney disease with stage 1 through stage 4 chronic kidney disease, or unspecified chronic kidney disease: Secondary | ICD-10-CM | POA: Diagnosis not present

## 2012-06-12 DIAGNOSIS — Z0000 Encounter for general adult medical examination without abnormal findings: Secondary | ICD-10-CM | POA: Diagnosis not present

## 2012-06-12 DIAGNOSIS — E7800 Pure hypercholesterolemia, unspecified: Secondary | ICD-10-CM | POA: Diagnosis not present

## 2012-06-28 DIAGNOSIS — D72819 Decreased white blood cell count, unspecified: Secondary | ICD-10-CM | POA: Diagnosis not present

## 2012-06-29 DIAGNOSIS — D3000 Benign neoplasm of unspecified kidney: Secondary | ICD-10-CM | POA: Diagnosis not present

## 2012-07-21 ENCOUNTER — Other Ambulatory Visit: Payer: Self-pay

## 2012-07-21 DIAGNOSIS — Z1231 Encounter for screening mammogram for malignant neoplasm of breast: Secondary | ICD-10-CM

## 2012-07-24 ENCOUNTER — Ambulatory Visit
Admission: RE | Admit: 2012-07-24 | Discharge: 2012-07-24 | Disposition: A | Discharge status: Home / Self Care | Payer: Medicare Other | Source: Ambulatory Visit

## 2012-07-24 DIAGNOSIS — Z1231 Encounter for screening mammogram for malignant neoplasm of breast: Secondary | ICD-10-CM | POA: Diagnosis not present

## 2012-12-27 DIAGNOSIS — E7800 Pure hypercholesterolemia, unspecified: Secondary | ICD-10-CM | POA: Diagnosis not present

## 2012-12-27 DIAGNOSIS — Z006 Encounter for examination for normal comparison and control in clinical research program: Secondary | ICD-10-CM | POA: Diagnosis not present

## 2012-12-27 DIAGNOSIS — Z79899 Other long term (current) drug therapy: Secondary | ICD-10-CM | POA: Diagnosis not present

## 2012-12-27 DIAGNOSIS — R7309 Other abnormal glucose: Secondary | ICD-10-CM | POA: Diagnosis not present

## 2012-12-27 DIAGNOSIS — N182 Chronic kidney disease, stage 2 (mild): Secondary | ICD-10-CM | POA: Diagnosis not present

## 2012-12-27 DIAGNOSIS — I129 Hypertensive chronic kidney disease with stage 1 through stage 4 chronic kidney disease, or unspecified chronic kidney disease: Secondary | ICD-10-CM | POA: Diagnosis not present

## 2013-01-29 ENCOUNTER — Emergency Department (HOSPITAL_COMMUNITY)
Admission: EM | Admit: 2013-01-29 | Discharge: 2013-01-29 | Disposition: A | Discharge status: Home / Self Care | Payer: Medicare Other | Attending: Emergency Medicine | Admitting: Emergency Medicine

## 2013-01-29 DIAGNOSIS — Z79899 Other long term (current) drug therapy: Secondary | ICD-10-CM | POA: Insufficient documentation

## 2013-01-29 DIAGNOSIS — Z8739 Personal history of other diseases of the musculoskeletal system and connective tissue: Secondary | ICD-10-CM | POA: Insufficient documentation

## 2013-01-29 DIAGNOSIS — I129 Hypertensive chronic kidney disease with stage 1 through stage 4 chronic kidney disease, or unspecified chronic kidney disease: Secondary | ICD-10-CM | POA: Diagnosis not present

## 2013-01-29 DIAGNOSIS — L259 Unspecified contact dermatitis, unspecified cause: Secondary | ICD-10-CM | POA: Diagnosis not present

## 2013-01-29 DIAGNOSIS — Z87891 Personal history of nicotine dependence: Secondary | ICD-10-CM | POA: Diagnosis not present

## 2013-01-29 DIAGNOSIS — K219 Gastro-esophageal reflux disease without esophagitis: Secondary | ICD-10-CM | POA: Insufficient documentation

## 2013-01-29 DIAGNOSIS — N189 Chronic kidney disease, unspecified: Secondary | ICD-10-CM | POA: Diagnosis not present

## 2013-01-29 DIAGNOSIS — L309 Dermatitis, unspecified: Secondary | ICD-10-CM

## 2013-01-29 MED ORDER — TRIAMCINOLONE ACETONIDE 0.1 % EX CREA: 1.0000 "application " | TOPICAL_CREAM | Freq: Two times a day (BID) | CUTANEOUS | Status: DC

## 2013-01-29 NOTE — ED Notes (Signed)
Pt started with a rash on her finger 2 weeks ago then it spread to her hands then some on her arm and then her face. Pt states this happens this time of year every year but this time it is extreme. She has cracks on her hands and has areas that are draining.

## 2013-01-29 NOTE — ED Provider Notes (Signed)
CSN: [id number]     Arrival date & time 01/29/13  1155 History   First MD Initiated Contact with Patient 01/29/13 1158     No chief complaint on file.  (Consider location/radiation/quality/duration/timing/severity/associated sxs/prior Treatment) HPI Comments: Patient is a 67 y/o female who presents to day for a rash on her b/l hands x 2 weeks. Patient states that symptoms have been worsening since onset. She has been using Triamcinolone cream sporadically without relief. Patient states that symptoms recurrent and worsen in cold weather; she endorses similar rashes which present yearly during the winter months. She states that rash associated with cracking and drying of her skin and intermittent weeping. She denies recent abx use and associated fever, tongue/lip swelling, difficulty swallowing, drooling, shortness of breath, numbness/tingling, and weakness. States she has a dermatology appt scheduled in mid January.   The history is provided by the patient. No language interpreter was used.    Past Medical History  Diagnosis Date  . Hypertension   . GERD (gastroesophageal reflux disease)   . Arthritis   . Chronic kidney disease     resent diagnosis of hematuria due to benigh kidney tumor   Past Surgical History  Procedure Laterality Date  . Diagnostic laparoscopy    . Tubal ligation    . Colposcopy  10/15/2011    Procedure: COLPOSCOPY;  Surgeon: Lahoma Crocker, MD;  Location: [facility];  Service: Gynecology;  Laterality: N/A;  . Esophageal biopsy  10/15/2011    Procedure: BIOPSY;  Surgeon: Lahoma Crocker, MD;  Location: [facility];  Service: Gynecology;;  Cervical  . Cervical conization w/bx  12/17/2011    Procedure: CONIZATION CERVIX WITH BIOPSY;  Surgeon: Lahoma Crocker, MD;  Location: [facility];  Service: Gynecology;  Laterality: N/A;   No family history on file. History  Substance Use Topics  . Smoking status: Former Research scientist (life sciences)  . Smokeless tobacco: Not on file  . Alcohol Use: Yes      Comment: rarely   OB History   Grav Para Term Preterm Abortions TAB SAB Ect Mult Living                 Review of Systems  Skin: Positive for rash.    Allergies  Review of patient's allergies indicates no known allergies.  Home Medications   Current Outpatient Rx  Name  Route  Sig  Dispense  Refill  . cholecalciferol (VITAMIN D) 1000 UNITS tablet   Oral   Take 2,000 Units by mouth daily.          Marland Kitchen esomeprazole (NEXIUM) 20 MG capsule   Oral   Take 20 mg by mouth daily at 12 noon.         Marland Kitchen lisinopril-hydrochlorothiazide (PRINZIDE,ZESTORETIC) 10-12.5 MG per tablet   Oral   Take 1 tablet by mouth daily.         . rosuvastatin (CRESTOR) 10 MG tablet   Oral   Take 10 mg by mouth daily.         Marland Kitchen triamcinolone cream (KENALOG) 0.1 %   Topical   Apply 1 application topically 2 (two) times daily.   30 g   1    BP 128/71  Pulse 78  Temp(Src) 97.8 F (36.6 C) (Oral)  Resp 16  SpO2 97%  Physical Exam  Nursing note and vitals reviewed. Constitutional: She is oriented to person, place, and time. She appears well-developed and well-nourished. No distress.  HENT:  Head: Normocephalic and atraumatic.  Eyes: Conjunctivae and EOM are  normal. No scleral icterus.  Neck: Normal range of motion.  Cardiovascular: Normal rate, regular rhythm and intact distal pulses.   Distal radial pulses 2+ b/l  Pulmonary/Chest: Effort normal. No respiratory distress.  Musculoskeletal: Normal range of motion.  Neurological: She is alert and oriented to person, place, and time.  No gross sensory deficits appreciated.  Skin: Skin is warm and dry. No rash noted. She is not diaphoretic. No erythema. No pallor.  Dry pruritic punctate papular rash on b/l hands, mostly on fingers. L index finger with cracking of skin and mild erythema. No active weeping appreciated. No swelling, heat to touch, red linear streaking or edema. No bullae.   Psychiatric: She has a normal mood and affect. Her  behavior is normal.    ED Course  Procedures (including critical care time) Labs Review Labs Reviewed - No data to display Imaging Review No results found.  EKG Interpretation   None       MDM   1. Eczema    Uncomplicated eczematous rash. Patient well and nontoxic appearing, hemodynamically stable, and afebrile. Rash nonconcerning for SJS, erythema multiforme major, or erythema multiforme minor. She endorses complaint today to be recurrent each winter time. Patient has dermatology appointment scheduled for mid January; she is reliable for follow up. She will be d/c today with Rx for Kenalog. Have instructed use of frequent moisturizers and vaseline on hands QHS. Return precautions discussed and patient agreeable to plan with no unaddressed concerns.    Antonietta Breach, PA-C 01/30/13 1450

## 2013-01-31 NOTE — ED Provider Notes (Signed)
Medical screening examination/treatment/procedure(s) were performed by non-physician practitioner and as supervising physician I was immediately available for consultation/collaboration.  EKG Interpretation   None       Iva Knapp, MD, FACEP   Iva L Knapp, MD 01/31/13 0728 

## 2013-02-01 ENCOUNTER — Ambulatory Visit: Payer: 59 | Admitting: Obstetrics & Gynecology

## 2013-02-05 ENCOUNTER — Ambulatory Visit: Payer: 59 | Admitting: Obstetrics & Gynecology

## 2013-02-06 ENCOUNTER — Telehealth: Payer: Self-pay

## 2013-02-06 ENCOUNTER — Ambulatory Visit (INDEPENDENT_AMBULATORY_CARE_PROVIDER_SITE_OTHER): Payer: Medicare Other | Admitting: Emergency Medicine

## 2013-02-06 ENCOUNTER — Other Ambulatory Visit: Payer: Self-pay | Admitting: Emergency Medicine

## 2013-02-06 VITALS — BP 124/74 | HR 88 | Temp 98.7°F | Resp 16 | Ht 66.0 in | Wt 218.6 lb

## 2013-02-06 DIAGNOSIS — B029 Zoster without complications: Secondary | ICD-10-CM | POA: Diagnosis not present

## 2013-02-06 MED ORDER — HYDROCODONE-ACETAMINOPHEN 5-325 MG PO TABS: 1.0000 | ORAL_TABLET | ORAL | Status: DC | PRN

## 2013-02-06 MED ORDER — ACYCLOVIR 800 MG PO TABS: 800.0000 mg | ORAL_TABLET | Freq: Every day | ORAL | Status: DC

## 2013-02-06 MED ORDER — VALACYCLOVIR HCL 1 G PO TABS: 1000.0000 mg | ORAL_TABLET | Freq: Two times a day (BID) | ORAL | Status: DC

## 2013-02-06 NOTE — Telephone Encounter (Signed)
PATIENT STATES SHE SAW DR. Dareen Piano TODAY FOR SHINGLES. THE MEDICATION THAT HE PRESCRIBED IS $420.00. SHE WOULD LIKE TO KNOW WHAT ELSE SHE CAN GET THAT IS NOT SO EXPENSIVE? HER INSURANCE WILL NOT START TO PAY ON HER MEDICATIONS UNTIL JAN. 14, 2015. BEST PHONE [phone] (HOME)   PHARMACY CHOICE IS [facility] ON [address].   MBC

## 2013-02-06 NOTE — Patient Instructions (Signed)
Shingles Shingles (herpes zoster) is an infection that is caused by the same virus that causes chickenpox (varicella). The infection causes a painful skin rash and fluid-filled blisters, which eventually break open, crust over, and heal. It may occur in any area of the body, but it usually affects only one side of the body or face. The pain of shingles usually lasts about 1 month. However, some people with shingles may develop long-term (chronic) pain in the affected area of the body. Shingles often occurs many years after the person had chickenpox. It is more common:  In people older than 50 years.  In people with weakened immune systems, such as those with HIV, AIDS, or cancer.  In people taking medicines that weaken the immune system, such as transplant medicines.  In people under great stress. CAUSES  Shingles is caused by the varicella zoster virus (VZV), which also causes chickenpox. After a person is infected with the virus, it can remain in the person's body for years in an inactive state (dormant). To cause shingles, the virus reactivates and breaks out as an infection in a nerve root. The virus can be spread from person to person (contagious) through contact with open blisters of the shingles rash. It will only spread to people who have not had chickenpox. When these people are exposed to the virus, they may develop chickenpox. They will not develop shingles. Once the blisters scab over, the person is no longer contagious and cannot spread the virus to others. SYMPTOMS  Shingles shows up in stages. The initial symptoms may be pain, itching, and tingling in an area of the skin. This pain is usually described as burning, stabbing, or throbbing.In a few days or weeks, a painful red rash will appear in the area where the pain, itching, and tingling were felt. The rash is usually on one side of the body in a band or belt-like pattern. Then, the rash usually turns into fluid-filled blisters. They  will scab over and dry up in approximately 2 3 weeks. Flu-like symptoms may also occur with the initial symptoms, the rash, or the blisters. These may include:  Fever.  Chills.  Headache.  Upset stomach. DIAGNOSIS  Your caregiver will perform a skin exam to diagnose shingles. Skin scrapings or fluid samples may also be taken from the blisters. This sample will be examined under a microscope or sent to a lab for further testing. TREATMENT  There is no specific cure for shingles. Your caregiver will likely prescribe medicines to help you manage the pain, recover faster, and avoid long-term problems. This may include antiviral drugs, anti-inflammatory drugs, and pain medicines. HOME CARE INSTRUCTIONS   Take a cool bath or apply cool compresses to the area of the rash or blisters as directed. This may help with the pain and itching.   Only take over-the-counter or prescription medicines as directed by your caregiver.   Rest as directed by your caregiver.  Keep your rash and blisters clean with mild soap and cool water or as directed by your caregiver.  Do not pick your blisters or scratch your rash. Apply an anti-itch cream or numbing creams to the affected area as directed by your caregiver.  Keep your shingles rash covered with a loose bandage (dressing).  Avoid skin contact with:  Babies.   Pregnant women.   Children with eczema.   Elderly people with transplants.   People with chronic illnesses, such as leukemia or AIDS.   Wear loose-fitting clothing to help ease   the pain of material rubbing against the rash.  Keep all follow-up appointments with your caregiver.If the area involved is on your face, you may receive a referral for follow-up to a specialist, such as an eye doctor (ophthalmologist) or an ear, nose, and throat (ENT) doctor. Keeping all follow-up appointments will help you avoid eye complications, chronic pain, or disability.  SEEK IMMEDIATE MEDICAL  CARE IF:   You have facial pain, pain around the eye area, or loss of feeling on one side of your face.  You have ear pain or ringing in your ear.  You have loss of taste.  Your pain is not relieved with prescribed medicines.   Your redness or swelling spreads.   You have more pain and swelling.  Your condition is worsening or has changed.   You have a feveror persistent symptoms for more than 2 3 days.  You have a fever and your symptoms suddenly get worse. MAKE SURE YOU:  Understand these instructions.  Will watch your condition.  Will get help right away if you are not doing well or get worse. Document Released: 02/01/2005 Document Revised: 10/27/2011 Document Reviewed: 09/16/2011 [employer] Patient Information 2014 [employer].  

## 2013-02-06 NOTE — Telephone Encounter (Signed)
Sent acyclovir.  Please notify patient.

## 2013-02-06 NOTE — Telephone Encounter (Signed)
Pt notified that Dr. Dareen Piano sent in rx for acyclovir and its only $1.00.

## 2013-02-06 NOTE — Progress Notes (Signed)
[facility] [address], [place] [address] [phone]  Date:  02/06/2013   Name:  Vanessa Ward   DOB:  06/14/1945   MRN:  [id number]  PCP:  Gwynneth Aliment, MD    Chief Complaint: Rash   History of Present Illness:  Vanessa Ward is a 67 y.o. very pleasant female patient who presents with the following:  Developed a painful vesicular rash on left mid back.  Started Friday.  Now blistered.  History of varicella but no shingles shot.  No fever or chills. No cough or coryza.  No nausea or vomiting.  No improvement with over the counter medications or other home remedies. Denies other complaint or health concern today.   Patient Active Problem List   Diagnosis Date Noted  . Cervical dysplasia, moderate 12/17/2011  . Cervical polyp 10/15/2011  . Papanicolaou smear of cervix with low grade squamous intraepithelial lesion (LGSIL) 10/15/2011    Past Medical History  Diagnosis Date  . Hypertension   . GERD (gastroesophageal reflux disease)   . Arthritis   . Chronic kidney disease     resent diagnosis of hematuria due to benigh kidney tumor    Past Surgical History  Procedure Laterality Date  . Diagnostic laparoscopy    . Tubal ligation    . Colposcopy  10/15/2011    Procedure: COLPOSCOPY;  Surgeon: Antionette Char, MD;  Location: [facility];  Service: Gynecology;  Laterality: N/A;  . Esophageal biopsy  10/15/2011    Procedure: BIOPSY;  Surgeon: Antionette Char, MD;  Location: [facility];  Service: Gynecology;;  Cervical  . Cervical conization w/bx  12/17/2011    Procedure: CONIZATION CERVIX WITH BIOPSY;  Surgeon: Antionette Char, MD;  Location: [facility];  Service: Gynecology;  Laterality: N/A;    History  Substance Use Topics  . Smoking status: Former Games developer  . Smokeless tobacco: Not on file  . Alcohol Use: Yes     Comment: rarely    History reviewed. No pertinent family history.  No Known Allergies  Medication list has been reviewed  and updated.  Current Outpatient Prescriptions on File Prior to Visit  Medication Sig Dispense Refill  . cholecalciferol (VITAMIN D) 1000 UNITS tablet Take 2,000 Units by mouth daily.       Marland Kitchen esomeprazole (NEXIUM) 20 MG capsule Take 20 mg by mouth daily at 12 noon.      Marland Kitchen lisinopril-hydrochlorothiazide (PRINZIDE,ZESTORETIC) 10-12.5 MG per tablet Take 1 tablet by mouth daily.      . rosuvastatin (CRESTOR) 10 MG tablet Take 10 mg by mouth daily.      Marland Kitchen triamcinolone cream (KENALOG) 0.1 % Apply 1 application topically 2 (two) times daily.  30 g  1   No current facility-administered medications on file prior to visit.    Review of Systems:  As per HPI, otherwise negative.    Physical Examination: Filed Vitals:   02/06/13 1358  BP: 124/74  Pulse: 88  Temp: 98.7 F (37.1 C)  Resp: 16   Filed Vitals:   02/06/13 1358  Height: 5\' 6"  (1.676 m)  Weight: 218 lb 9.6 oz (99.156 kg)   Body mass index is 35.3 kg/(m^2). Ideal Body Weight: Weight in (lb) to have BMI = 25: 154.6   GEN: WDWN, NAD, Non-toxic, Alert & Oriented x 3 HEENT: Atraumatic, Normocephalic.  Ears and Nose: No external deformity. Chest clear BS=.   EXTR: No clubbing/cyanosis/edema NEURO: Normal gait.  PSYCH: Normally interactive. Conversant. Not depressed or  anxious appearing.  Calm demeanor.  SKIN:  Rash characteristic of shingles left flank.  Assessment and Plan: Shingles Valtrex vicodin  Follow up as needed.   Signed,  Phillips Odor, MD

## 2013-02-06 NOTE — Telephone Encounter (Signed)
PATIENT CALLED BACK TO SAY THAT HER INSURANCE WILL COVER THE MEDICATION FOR HER SHINGLES EXCEPT DR. ANDERSON NEEDS TO WRITE IT FOR 44 PILLS INSTEAD OF 45. THAT WAY SHE WILL ONLY HAVE TO PAY $88.00. PLEASE CALL HER AS SOON AS IT IS DONE SO THAT SHE CAN GO AHEAD AND PICK IT UP. BEST PHONE [phone] (CELL)    PHARMACY CHOICE IS [facility] ON [address].   MBC

## 2013-02-21 DIAGNOSIS — Z7182 Exercise counseling: Secondary | ICD-10-CM | POA: Diagnosis not present

## 2013-02-21 DIAGNOSIS — Z713 Dietary counseling and surveillance: Secondary | ICD-10-CM | POA: Diagnosis not present

## 2013-02-21 DIAGNOSIS — B029 Zoster without complications: Secondary | ICD-10-CM | POA: Diagnosis not present

## 2013-02-23 DIAGNOSIS — M25549 Pain in joints of unspecified hand: Secondary | ICD-10-CM | POA: Diagnosis not present

## 2013-02-23 DIAGNOSIS — S6980XA Other specified injuries of unspecified wrist, hand and finger(s), initial encounter: Secondary | ICD-10-CM | POA: Diagnosis not present

## 2013-02-23 DIAGNOSIS — M20019 Mallet finger of unspecified finger(s): Secondary | ICD-10-CM | POA: Diagnosis not present

## 2013-02-23 DIAGNOSIS — S6990XA Unspecified injury of unspecified wrist, hand and finger(s), initial encounter: Secondary | ICD-10-CM | POA: Diagnosis not present

## 2013-02-28 DIAGNOSIS — M20019 Mallet finger of unspecified finger(s): Secondary | ICD-10-CM | POA: Diagnosis not present

## 2013-03-02 DIAGNOSIS — L259 Unspecified contact dermatitis, unspecified cause: Secondary | ICD-10-CM | POA: Diagnosis not present

## 2013-03-12 ENCOUNTER — Encounter: Payer: Self-pay | Admitting: Obstetrics & Gynecology

## 2013-03-12 ENCOUNTER — Ambulatory Visit (INDEPENDENT_AMBULATORY_CARE_PROVIDER_SITE_OTHER): Payer: Medicare Other | Admitting: Obstetrics & Gynecology

## 2013-03-12 VITALS — BP 126/81 | HR 80 | Temp 99.0°F | Ht 64.0 in | Wt 222.0 lb

## 2013-03-12 DIAGNOSIS — Z0000 Encounter for general adult medical examination without abnormal findings: Secondary | ICD-10-CM

## 2013-03-12 DIAGNOSIS — Z124 Encounter for screening for malignant neoplasm of cervix: Secondary | ICD-10-CM | POA: Diagnosis not present

## 2013-03-12 DIAGNOSIS — N841 Polyp of cervix uteri: Secondary | ICD-10-CM | POA: Diagnosis not present

## 2013-03-12 DIAGNOSIS — R87612 Low grade squamous intraepithelial lesion on cytologic smear of cervix (LGSIL): Secondary | ICD-10-CM | POA: Diagnosis not present

## 2013-03-12 DIAGNOSIS — N871 Moderate cervical dysplasia: Secondary | ICD-10-CM | POA: Diagnosis not present

## 2013-03-12 LAB — POCT URINALYSIS DIPSTICK
Bilirubin, UA: NEGATIVE
Blood, UA: 250
Glucose, UA: NEGATIVE
Ketones, UA: NEGATIVE
Nitrite, UA: NEGATIVE
Spec Grav, UA: 1.02
Urobilinogen, UA: NEGATIVE
pH, UA: 5

## 2013-03-12 NOTE — Patient Instructions (Signed)

## 2013-03-12 NOTE — Addendum Note (Signed)
Addended by: Lewie Loron D on: 03/12/2013 02:55 PM   Modules accepted: Orders

## 2013-03-12 NOTE — Progress Notes (Signed)
Subjective:     Vanessa Ward is a 68 y.o. female here for a routine exam.  Current complaints: recent hx of shingles, still having occasional pain, .  Personal health questionnaire reviewed: yes.   Gynecologic History No LMP recorded. Patient is postmenopausal. Contraception: post menopausal status Last Pap: 12/2011. Results were: normal Last mammogram: 07/21/2012. Results were: normal  Obstetric History OB History  No data available     The following portions of the patient's history were reviewed and updated as appropriate: allergies, current medications, past family history, past medical history, past social history, past surgical history and problem list.  Review of Systems Pertinent items are noted in HPI.    Objective:    General appearance: alert Breasts: normal appearance, no masses or tenderness Abdomen: soft, non-tender; bowel sounds normal; no masses,  no organomegaly Pelvic: cervix with small, sessile, friable polyp; stenotic, external genitalia normal, no adnexal masses or tenderness, uterus normal size, shape, and consistency and vagina normal without discharge  Assessment:    Healthy female exam.   Cervical polyp Plan:    follow up as needed.

## 2013-03-14 LAB — PAP IG AND HPV HIGH-RISK: HPV DNA High Risk: NOT DETECTED

## 2013-03-15 DIAGNOSIS — M20019 Mallet finger of unspecified finger(s): Secondary | ICD-10-CM | POA: Diagnosis not present

## 2013-04-05 DIAGNOSIS — M20019 Mallet finger of unspecified finger(s): Secondary | ICD-10-CM | POA: Diagnosis not present

## 2013-04-05 DIAGNOSIS — S6990XA Unspecified injury of unspecified wrist, hand and finger(s), initial encounter: Secondary | ICD-10-CM | POA: Diagnosis not present

## 2013-04-05 DIAGNOSIS — S6980XA Other specified injuries of unspecified wrist, hand and finger(s), initial encounter: Secondary | ICD-10-CM | POA: Diagnosis not present

## 2013-04-06 DIAGNOSIS — M20019 Mallet finger of unspecified finger(s): Secondary | ICD-10-CM | POA: Diagnosis not present

## 2013-04-17 DIAGNOSIS — M20019 Mallet finger of unspecified finger(s): Secondary | ICD-10-CM | POA: Diagnosis not present

## 2013-04-27 DIAGNOSIS — M20019 Mallet finger of unspecified finger(s): Secondary | ICD-10-CM | POA: Diagnosis not present

## 2013-05-01 DIAGNOSIS — S6980XA Other specified injuries of unspecified wrist, hand and finger(s), initial encounter: Secondary | ICD-10-CM | POA: Diagnosis not present

## 2013-05-01 DIAGNOSIS — M20019 Mallet finger of unspecified finger(s): Secondary | ICD-10-CM | POA: Diagnosis not present

## 2013-05-01 DIAGNOSIS — S6990XA Unspecified injury of unspecified wrist, hand and finger(s), initial encounter: Secondary | ICD-10-CM | POA: Diagnosis not present

## 2013-06-29 DIAGNOSIS — D3000 Benign neoplasm of unspecified kidney: Secondary | ICD-10-CM | POA: Diagnosis not present

## 2013-06-29 DIAGNOSIS — N390 Urinary tract infection, site not specified: Secondary | ICD-10-CM | POA: Diagnosis not present

## 2013-07-12 DIAGNOSIS — N182 Chronic kidney disease, stage 2 (mild): Secondary | ICD-10-CM | POA: Diagnosis not present

## 2013-07-12 DIAGNOSIS — R7309 Other abnormal glucose: Secondary | ICD-10-CM | POA: Diagnosis not present

## 2013-07-12 DIAGNOSIS — I129 Hypertensive chronic kidney disease with stage 1 through stage 4 chronic kidney disease, or unspecified chronic kidney disease: Secondary | ICD-10-CM | POA: Diagnosis not present

## 2013-07-12 DIAGNOSIS — Z0000 Encounter for general adult medical examination without abnormal findings: Secondary | ICD-10-CM | POA: Diagnosis not present

## 2013-07-12 DIAGNOSIS — E559 Vitamin D deficiency, unspecified: Secondary | ICD-10-CM | POA: Diagnosis not present

## 2013-07-12 DIAGNOSIS — Z23 Encounter for immunization: Secondary | ICD-10-CM | POA: Diagnosis not present

## 2013-07-16 ENCOUNTER — Other Ambulatory Visit: Payer: Self-pay

## 2013-07-16 DIAGNOSIS — Z1231 Encounter for screening mammogram for malignant neoplasm of breast: Secondary | ICD-10-CM

## 2013-07-30 ENCOUNTER — Encounter (INDEPENDENT_AMBULATORY_CARE_PROVIDER_SITE_OTHER): Payer: Self-pay

## 2013-07-30 ENCOUNTER — Ambulatory Visit
Admission: RE | Admit: 2013-07-30 | Discharge: 2013-07-30 | Disposition: A | Discharge status: Home / Self Care | Payer: Medicare Other | Source: Ambulatory Visit

## 2013-07-30 DIAGNOSIS — Z1231 Encounter for screening mammogram for malignant neoplasm of breast: Secondary | ICD-10-CM

## 2013-12-04 DIAGNOSIS — Z23 Encounter for immunization: Secondary | ICD-10-CM | POA: Diagnosis not present

## 2014-01-17 DIAGNOSIS — N182 Chronic kidney disease, stage 2 (mild): Secondary | ICD-10-CM | POA: Diagnosis not present

## 2014-01-17 DIAGNOSIS — R7309 Other abnormal glucose: Secondary | ICD-10-CM | POA: Diagnosis not present

## 2014-01-17 DIAGNOSIS — M25571 Pain in right ankle and joints of right foot: Secondary | ICD-10-CM | POA: Diagnosis not present

## 2014-01-17 DIAGNOSIS — M810 Age-related osteoporosis without current pathological fracture: Secondary | ICD-10-CM | POA: Diagnosis not present

## 2014-01-17 DIAGNOSIS — I129 Hypertensive chronic kidney disease with stage 1 through stage 4 chronic kidney disease, or unspecified chronic kidney disease: Secondary | ICD-10-CM | POA: Diagnosis not present

## 2014-02-06 ENCOUNTER — Emergency Department (HOSPITAL_COMMUNITY)
Admission: EM | Admit: 2014-02-06 | Discharge: 2014-02-06 | Disposition: A | Discharge status: Home / Self Care | Payer: Medicare Other | Attending: Emergency Medicine | Admitting: Emergency Medicine

## 2014-02-06 ENCOUNTER — Encounter (HOSPITAL_COMMUNITY): Payer: Self-pay | Admitting: Emergency Medicine

## 2014-02-06 DIAGNOSIS — X19XXXA Contact with other heat and hot substances, initial encounter: Secondary | ICD-10-CM | POA: Diagnosis not present

## 2014-02-06 DIAGNOSIS — Z87891 Personal history of nicotine dependence: Secondary | ICD-10-CM | POA: Diagnosis not present

## 2014-02-06 DIAGNOSIS — T23201A Burn of second degree of right hand, unspecified site, initial encounter: Secondary | ICD-10-CM | POA: Insufficient documentation

## 2014-02-06 DIAGNOSIS — Z8739 Personal history of other diseases of the musculoskeletal system and connective tissue: Secondary | ICD-10-CM | POA: Diagnosis not present

## 2014-02-06 DIAGNOSIS — Y9389 Activity, other specified: Secondary | ICD-10-CM | POA: Diagnosis not present

## 2014-02-06 DIAGNOSIS — Z7952 Long term (current) use of systemic steroids: Secondary | ICD-10-CM | POA: Diagnosis not present

## 2014-02-06 DIAGNOSIS — I129 Hypertensive chronic kidney disease with stage 1 through stage 4 chronic kidney disease, or unspecified chronic kidney disease: Secondary | ICD-10-CM | POA: Diagnosis not present

## 2014-02-06 DIAGNOSIS — Y998 Other external cause status: Secondary | ICD-10-CM | POA: Diagnosis not present

## 2014-02-06 DIAGNOSIS — Z79899 Other long term (current) drug therapy: Secondary | ICD-10-CM | POA: Diagnosis not present

## 2014-02-06 DIAGNOSIS — Y9289 Other specified places as the place of occurrence of the external cause: Secondary | ICD-10-CM | POA: Diagnosis not present

## 2014-02-06 DIAGNOSIS — N189 Chronic kidney disease, unspecified: Secondary | ICD-10-CM | POA: Insufficient documentation

## 2014-02-06 DIAGNOSIS — R21 Rash and other nonspecific skin eruption: Secondary | ICD-10-CM | POA: Insufficient documentation

## 2014-02-06 DIAGNOSIS — Z8619 Personal history of other infectious and parasitic diseases: Secondary | ICD-10-CM | POA: Diagnosis not present

## 2014-02-06 DIAGNOSIS — K219 Gastro-esophageal reflux disease without esophagitis: Secondary | ICD-10-CM | POA: Insufficient documentation

## 2014-02-06 MED ORDER — CLINDAMYCIN HCL 300 MG PO CAPS: 300.0000 mg | ORAL_CAPSULE | Freq: Four times a day (QID) | ORAL | Status: DC

## 2014-02-06 MED ORDER — SILVER SULFADIAZINE 1 % EX CREA
1.0000 | TOPICAL_CREAM | Freq: Two times a day (BID) | CUTANEOUS | Status: AC
Start: 2014-02-06 — End: 2014-02-16

## 2014-02-06 NOTE — ED Notes (Signed)
Initial Contact - pt A+Ox4, changed to hospital gown, reports rash to back, groin, arms/chest x2 days, itching, denies pain, denies new exposure to allergens.  Raised red bumps noted.  Pt also reports burn to R hand/thumb area from the stove, reports using aloe on site, but denies other new lotions.  Skin otherwise PWD.  MAEI.  Speaking full/clear sentences, rr eve/un-lab.  NAD.

## 2014-02-06 NOTE — Discharge Instructions (Signed)
Burn Care Burns hurt your skin. When your skin is hurt, it is easier to get an infection. Follow your doctor's directions to help prevent an infection. HOME CARE  Wash your hands well before you change your bandage.  Change your bandage as often as told by your doctor.  Remove the old bandage. If the bandage sticks, soak it off with cool, clean water.  Gently clean the burn with mild soap and water.  Pat the burn dry with a clean, dry cloth.  Put a thin layer of medicated cream on the burn.  Put a clean bandage on as told by your doctor.  Keep the bandage clean and dry.  Raise (elevate) the burn for the first 24 hours. After that, follow your doctor's directions.  Only take medicine as told by your doctor. GET HELP RIGHT AWAY IF:   You have too much pain.  The skin near the burn is red, tender, puffy (swollen), or has red streaks.  The burn area has yellowish white fluid (pus) or a bad smell coming from it.  You have a fever. MAKE SURE YOU:   Understand these instructions.  Will watch your condition.  Will get help right away if you are not doing well or get worse. Document Released: 11/11/2007 Document Revised: 04/26/2011 Document Reviewed: 06/24/2010 [facility] Patient Information 2015 [place], [place]. This information is not intended to replace advice given to you by your health care provider. Make sure you discuss any questions you have with your health care provider.   You may use Claritin or Zyrtec daily for your itching.

## 2014-02-06 NOTE — ED Notes (Signed)
Pt c/o generalized rash x 2 days. Pt c/o itching also. Pt has burn to right hand has started using aloe vera gel on that but no other changes to lotions or detergents.

## 2014-02-06 NOTE — ED Provider Notes (Signed)
CSN: [id number]     Arrival date & time 02/06/14  1000 History   First MD Initiated Contact with Patient 02/06/14 1014     Chief Complaint  Patient presents with  . Rash     (Consider location/radiation/quality/duration/timing/severity/associated sxs/prior Treatment) Patient is a 68 y.o. female presenting with rash. The history is provided by the patient.  Rash Location:  Full body Quality: itchiness   Severity:  Mild Onset quality:  Sudden Duration:  2 days Timing:  Constant Progression:  Unchanged Chronicity:  New Context comment:  New burn to right hand Relieved by:  Nothing Worsened by:  Nothing tried Ineffective treatments: aloe, neosporin, benadryl. Associated symptoms: no abdominal pain, no diarrhea, no fatigue, no fever, no headaches, no nausea, no shortness of breath and not vomiting     Past Medical History  Diagnosis Date  . Hypertension   . GERD (gastroesophageal reflux disease)   . Arthritis   . Chronic kidney disease     resent diagnosis of hematuria due to benigh kidney tumor  . Shingles    Past Surgical History  Procedure Laterality Date  . Diagnostic laparoscopy    . Tubal ligation    . Colposcopy  10/15/2011    Procedure: COLPOSCOPY;  Surgeon: Lahoma Crocker, MD;  Location: [facility];  Service: Gynecology;  Laterality: N/A;  . Esophageal biopsy  10/15/2011    Procedure: BIOPSY;  Surgeon: Lahoma Crocker, MD;  Location: [facility];  Service: Gynecology;;  Cervical  . Cervical conization w/bx  12/17/2011    Procedure: CONIZATION CERVIX WITH BIOPSY;  Surgeon: Lahoma Crocker, MD;  Location: [facility];  Service: Gynecology;  Laterality: N/A;   Family History  Problem Relation Age of Onset  . Hypertension Mother   . Heart disease Mother   . Hypertension Father   . Diabetes Paternal Grandmother    History  Substance Use Topics  . Smoking status: Former Research scientist (life sciences)  . Smokeless tobacco: Not on file  . Alcohol Use: Yes     Comment: rarely   OB History      No data available     Review of Systems  Constitutional: Negative for fever and fatigue.  HENT: Negative for congestion and drooling.   Eyes: Negative for pain.  Respiratory: Negative for cough and shortness of breath.   Cardiovascular: Negative for chest pain.  Gastrointestinal: Negative for nausea, vomiting, abdominal pain and diarrhea.  Genitourinary: Negative for dysuria and hematuria.  Musculoskeletal: Negative for back pain, gait problem and neck pain.  Skin: Positive for rash and wound. Negative for color change.  Neurological: Negative for dizziness and headaches.  Hematological: Negative for adenopathy.  Psychiatric/Behavioral: Negative for behavioral problems.  All other systems reviewed and are negative.     Allergies  Other  Home Medications   Prior to Admission medications   Medication Sig Start Date End Date Taking? Authorizing Provider  cholecalciferol (VITAMIN D) 1000 UNITS tablet Take 2,000 Units by mouth daily.     Historical Provider, MD  lisinopril-hydrochlorothiazide (PRINZIDE,ZESTORETIC) 10-12.5 MG per tablet Take 1 tablet by mouth daily. 12/13/12   Historical Provider, MD  OMEPRAZOLE PO Take 20 mg by mouth daily.    Historical Provider, MD  rosuvastatin (CRESTOR) 10 MG tablet Take 10 mg by mouth daily.    Historical Provider, MD  triamcinolone cream (KENALOG) 0.1 % Apply 1 application topically 2 (two) times daily. 01/29/13   Antonietta Breach, PA-C   BP 124/74 mmHg  Pulse 87  Temp(Src) 99.4 F (37.4 C) (Oral)  Resp 16  SpO2 99% Physical Exam  Constitutional: She is oriented to person, place, and time. She appears well-developed and well-nourished.  HENT:  Head: Normocephalic.  Mouth/Throat: No oropharyngeal exudate.  Eyes: Conjunctivae and EOM are normal. Pupils are equal, round, and reactive to light.  Neck: Normal range of motion. Neck supple.  Cardiovascular: Normal rate, regular rhythm, normal heart sounds and intact distal pulses.  Exam reveals  no gallop and no friction rub.   No murmur heard. Pulmonary/Chest: Effort normal and breath sounds normal. No respiratory distress. She has no wheezes.  Abdominal: Soft. Bowel sounds are normal. There is no tenderness. There is no rebound and no guarding.  Musculoskeletal: Normal range of motion. She exhibits no edema or tenderness.  Neurological: She is alert and oriented to person, place, and time.  Skin: Skin is warm and dry.  1% body surface area, non-circumferential, superficial partial thickness and possibly small area of deep partial-thickness burn to the dorsal aspect of the right hand. The burn area is sensate and blanches.   Sparsely distributed papular lesions noted on the torso, and extremities.  Psychiatric: She has a normal mood and affect. Her behavior is normal.  Nursing note and vitals reviewed.   ED Course  Procedures (including critical care time) Labs Review Labs Reviewed - No data to display  Imaging Review No results found.   EKG Interpretation None        MDM   Final diagnoses:  Burn of right hand, second degree, initial encounter  Rash    11:03 AM 68 y.o. female w hx of eczema who presents with a burn to her right hand which occurred one week ago and a rash which started 2 days ago. She has been applying Neosporin and aloe to the right hand burn for 1 week. She notes that she burned it in the oven. 2 days ago she began noticing small papular lesions on her trunk and extremities sparingly. This is possibly an allergic reaction. May be related to the new topical medication she is using on her burn such as Neosporin and aloe. Given the sparsly distributed lesions, insect bites and bedbugs are also on the differential. Will recommend second-generation antihistamine, Silvadene for the burn, and cover her with an antibiotic to prevent the burn from getting infected as she notes it looks worse.   11:13 AM:  I have discussed the diagnosis/risks/treatment options  with the patient and believe the pt to be eligible for discharge home to follow-up with her pcp in 1 week. We also discussed returning to the ED immediately if new or worsening sx occur. We discussed the sx which are most concerning (e.g., worsening pain, worsening rash, fever, spreading redness) that necessitate immediate return. Medications administered to the patient during their visit and any new prescriptions provided to the patient are listed below.  Medications given during this visit Medications - No data to display  New Prescriptions   CLINDAMYCIN (CLEOCIN) 300 MG CAPSULE    Take 1 capsule (300 mg total) by mouth 4 (four) times daily. X 7 days   SILVER SULFADIAZINE (SILVADENE) 1 % CREAM    Apply 1 application topically 2 (two) times daily.     Pamella Pert, MD 02/06/14 1113

## 2014-02-11 ENCOUNTER — Encounter: Payer: Self-pay | Admitting: *Deleted

## 2014-02-26 DIAGNOSIS — M25471 Effusion, right ankle: Secondary | ICD-10-CM | POA: Diagnosis not present

## 2014-02-26 DIAGNOSIS — M129 Arthropathy, unspecified: Secondary | ICD-10-CM | POA: Diagnosis not present

## 2014-03-11 ENCOUNTER — Ambulatory Visit: Payer: PRIVATE HEALTH INSURANCE | Admitting: Obstetrics & Gynecology

## 2014-03-12 DIAGNOSIS — M129 Arthropathy, unspecified: Secondary | ICD-10-CM | POA: Diagnosis not present

## 2014-03-12 DIAGNOSIS — M25471 Effusion, right ankle: Secondary | ICD-10-CM | POA: Diagnosis not present

## 2014-03-25 ENCOUNTER — Telehealth: Payer: Self-pay | Admitting: Obstetrics

## 2014-04-01 NOTE — Telephone Encounter (Signed)
04/01/2014 - Left patient repeated messages to please call and schedule with new provider. brm

## 2014-07-04 DIAGNOSIS — D3002 Benign neoplasm of left kidney: Secondary | ICD-10-CM | POA: Diagnosis not present

## 2014-07-19 ENCOUNTER — Other Ambulatory Visit: Payer: Self-pay

## 2014-07-19 DIAGNOSIS — Z1231 Encounter for screening mammogram for malignant neoplasm of breast: Secondary | ICD-10-CM

## 2014-08-15 ENCOUNTER — Ambulatory Visit
Admission: RE | Admit: 2014-08-15 | Discharge: 2014-08-15 | Disposition: A | Discharge status: Home / Self Care | Payer: Medicare Other | Source: Ambulatory Visit

## 2014-08-15 DIAGNOSIS — Z1231 Encounter for screening mammogram for malignant neoplasm of breast: Secondary | ICD-10-CM

## 2014-08-22 DIAGNOSIS — E559 Vitamin D deficiency, unspecified: Secondary | ICD-10-CM | POA: Diagnosis not present

## 2014-08-22 DIAGNOSIS — E780 Pure hypercholesterolemia: Secondary | ICD-10-CM | POA: Diagnosis not present

## 2014-08-22 DIAGNOSIS — Z0000 Encounter for general adult medical examination without abnormal findings: Secondary | ICD-10-CM | POA: Diagnosis not present

## 2014-08-22 DIAGNOSIS — R7309 Other abnormal glucose: Secondary | ICD-10-CM | POA: Diagnosis not present

## 2014-08-22 DIAGNOSIS — I129 Hypertensive chronic kidney disease with stage 1 through stage 4 chronic kidney disease, or unspecified chronic kidney disease: Secondary | ICD-10-CM | POA: Diagnosis not present

## 2014-08-22 DIAGNOSIS — Z1389 Encounter for screening for other disorder: Secondary | ICD-10-CM | POA: Diagnosis not present

## 2014-08-22 DIAGNOSIS — N182 Chronic kidney disease, stage 2 (mild): Secondary | ICD-10-CM | POA: Diagnosis not present

## 2015-02-26 DIAGNOSIS — R7309 Other abnormal glucose: Secondary | ICD-10-CM | POA: Diagnosis not present

## 2015-02-26 DIAGNOSIS — N182 Chronic kidney disease, stage 2 (mild): Secondary | ICD-10-CM | POA: Diagnosis not present

## 2015-02-26 DIAGNOSIS — I129 Hypertensive chronic kidney disease with stage 1 through stage 4 chronic kidney disease, or unspecified chronic kidney disease: Secondary | ICD-10-CM | POA: Diagnosis not present

## 2015-02-26 DIAGNOSIS — E7800 Pure hypercholesterolemia, unspecified: Secondary | ICD-10-CM | POA: Diagnosis not present

## 2015-03-18 ENCOUNTER — Encounter: Payer: Self-pay | Admitting: Certified Nurse Midwife

## 2015-03-18 ENCOUNTER — Other Ambulatory Visit: Payer: Self-pay | Admitting: Certified Nurse Midwife

## 2015-03-18 ENCOUNTER — Ambulatory Visit (INDEPENDENT_AMBULATORY_CARE_PROVIDER_SITE_OTHER): Payer: Medicare Other | Admitting: Certified Nurse Midwife

## 2015-03-18 VITALS — BP 119/78 | HR 74 | Temp 98.0°F | Ht 64.0 in | Wt 236.0 lb

## 2015-03-18 DIAGNOSIS — Z0000 Encounter for general adult medical examination without abnormal findings: Secondary | ICD-10-CM | POA: Diagnosis not present

## 2015-03-18 DIAGNOSIS — Z124 Encounter for screening for malignant neoplasm of cervix: Secondary | ICD-10-CM

## 2015-03-18 DIAGNOSIS — Z01419 Encounter for gynecological examination (general) (routine) without abnormal findings: Secondary | ICD-10-CM

## 2015-03-18 DIAGNOSIS — Z87410 Personal history of cervical dysplasia: Secondary | ICD-10-CM | POA: Diagnosis not present

## 2015-03-18 NOTE — Addendum Note (Signed)
Addended by: Lewie Loron D on: 03/18/2015 11:25 AM   Modules accepted: Orders

## 2015-03-18 NOTE — Progress Notes (Signed)
Patient ID: Vanessa Ward, female   DOB: [birth year], 70 y.o.   MRN: [id number]    Subjective:        Vanessa Ward is a 70 y.o. female here for a routine exam.  Current complaints: none.  Hx of LGSIL.  Has kidney obstruction on left side, denies any pain, does have a hx of hematuria.  Goes back to urology in May, has a hx of benign kidney tumor.  Not currently sexually active.  Did have past hx of 44+ year smoking, quit last year with Chantix, is doing well.    Personal health questionnaire:  Is patient Ashkenazi Jewish, have a family history of breast and/or ovarian cancer: no Is there a family history of uterine cancer diagnosed at age < 41, gastrointestinal cancer, urinary tract cancer, family member who is a Field seismologist syndrome-associated carrier: yes, mother liver CA in her 21's Is the patient overweight and hypertensive, family history of diabetes, personal history of gestational diabetes, preeclampsia or PCOS: yes Is patient over 90, have PCOS,  family history of premature CHD under age 50, diabetes, smoke, have hypertension or peripheral artery disease:  yes At any time, has a partner hit, kicked or otherwise hurt or frightened you?: no Over the past 2 weeks, have you felt down, depressed or hopeless?: no Over the past 2 weeks, have you felt little interest or pleasure in doing things?:no   Gynecologic History No LMP recorded. Patient is postmenopausal. Contraception: abstinence Last Pap: 03/12/13. Results were: normal Last mammogram: 08/15/14. Results were: normal  Obstetric History OB History  No data available    Past Medical History  Diagnosis Date  . Hypertension   . GERD (gastroesophageal reflux disease)   . Arthritis   . Chronic kidney disease     resent diagnosis of hematuria due to benigh kidney tumor  . Shingles     Past Surgical History  Procedure Laterality Date  . Diagnostic laparoscopy    . Tubal ligation    . Colposcopy  10/15/2011    Procedure:  COLPOSCOPY;  Surgeon: Lahoma Crocker, MD;  Location: [facility];  Service: Gynecology;  Laterality: N/A;  . Biopsy  10/15/2011    Procedure: BIOPSY;  Surgeon: Lahoma Crocker, MD;  Location: [facility];  Service: Gynecology;;  Cervical  . Cervical conization w/bx  12/17/2011    Procedure: CONIZATION CERVIX WITH BIOPSY;  Surgeon: Lahoma Crocker, MD;  Location: [facility];  Service: Gynecology;  Laterality: N/A;     Current outpatient prescriptions:  .  cholecalciferol (VITAMIN D) 1000 UNITS tablet, Take 2,000 Units by mouth daily. , Disp: , Rfl:  .  lisinopril-hydrochlorothiazide (PRINZIDE,ZESTORETIC) 10-12.5 MG per tablet, Take 1 tablet by mouth daily., Disp: , Rfl:  .  OMEPRAZOLE PO, Take 20 mg by mouth daily., Disp: , Rfl:  .  rosuvastatin (CRESTOR) 10 MG tablet, Take 10 mg by mouth daily., Disp: , Rfl:  Allergies  Allergen Reactions  . Other Rash    Perfume, scented lotion    Social History  Substance Use Topics  . Smoking status: Former Research scientist (life sciences)  . Smokeless tobacco: Not on file  . Alcohol Use: 0.0 oz/week    0 Standard drinks or equivalent per week     Comment: rarely    Family History  Problem Relation Age of Onset  . Hypertension Mother   . Heart disease Mother   . Hypertension Father   . Diabetes Paternal Grandmother       Review of Systems  Constitutional: negative  for fatigue and weight loss Respiratory: negative for cough and wheezing Cardiovascular: negative for chest pain, fatigue and palpitations Gastrointestinal: negative for abdominal pain and change in bowel habits Musculoskeletal:negative for myalgias Neurological: negative for gait problems and tremors Behavioral/Psych: negative for abusive relationship, depression Endocrine: negative for temperature intolerance   Genitourinary:negative for abnormal menstrual periods, genital lesions, hot flashes, sexual problems and vaginal discharge Integument/breast: negative for breast lump, breast tenderness, nipple  discharge and skin lesion(s)    Objective:       BP 119/78 mmHg  Pulse 74  Temp(Src) 98 F (36.7 C)  Ht 5\' 4"  (1.626 m)  Wt 236 lb (107.049 kg)  BMI 40.49 kg/m2 General:   alert  Skin:   no rash or abnormalities  Lungs:   clear to auscultation bilaterally  Heart:   regular rate and rhythm, S1, S2 normal, no murmur, click, rub or gallop  Breasts:   normal without suspicious masses, skin or nipple changes or axillary nodes  Abdomen:  normal findings: no organomegaly, soft, non-tender and no hernia  Pelvis:  External genitalia: normal general appearance Urinary system: urethral meatus normal and bladder without fullness, nontender Vaginal: normal without tenderness, induration or masses Cervix: normal appearance Adnexa: normal bimanual exam Uterus: anteverted and non-tender, normal size   Lab Review Urine pregnancy test Labs reviewed yes Radiologic studies reviewed yes  50% of 30 min visit spent on counseling and coordination of care.   Assessment:    Healthy female exam.   Hx of HGSIL on colpo in 2013  Plan:    Education reviewed: calcium supplements, depression evaluation, low fat, low cholesterol diet, self breast exams, skin cancer screening and weight bearing exercise. Contraception: abstinence and post menopausal status. Follow up in: 1-2 years for pap smear. Not due for mammogram until June 2017   No orders of the defined types were placed in this encounter.   No orders of the defined types were placed in this encounter.   Need to obtain previous records

## 2015-03-19 LAB — PAP IG (IMAGE GUIDED)

## 2015-03-21 LAB — BACTERIAL VAGINOSIS DNA
Atopobium vaginae: NOT DETECTED Log (cells/mL)
Gardnerella vaginalis: 7.7 Log (cells/mL)
LACTOBACILLUS SPECIES: NOT DETECTED Log (cells/mL)
MEGASPHAERA SPECIES: NOT DETECTED Log (cells/mL)

## 2015-03-21 LAB — CANDIDIASIS, PCR
C. albicans, DNA: NOT DETECTED
C. glabrata, DNA: NOT DETECTED
C. parapsilosis, DNA: DETECTED — AB
C. tropicalis, DNA: NOT DETECTED

## 2015-03-25 ENCOUNTER — Other Ambulatory Visit: Payer: Self-pay | Admitting: Certified Nurse Midwife

## 2015-03-25 DIAGNOSIS — B9689 Other specified bacterial agents as the cause of diseases classified elsewhere: Secondary | ICD-10-CM

## 2015-03-25 DIAGNOSIS — N760 Acute vaginitis: Secondary | ICD-10-CM

## 2015-03-25 MED ORDER — METRONIDAZOLE 500 MG PO TABS: 500.0000 mg | ORAL_TABLET | Freq: Two times a day (BID) | ORAL | Status: DC

## 2015-03-25 MED ORDER — FLUCONAZOLE 200 MG PO TABS: 200.0000 mg | ORAL_TABLET | Freq: Once | ORAL | Status: DC

## 2015-07-31 ENCOUNTER — Other Ambulatory Visit: Payer: Self-pay | Admitting: Internal Medicine

## 2015-07-31 DIAGNOSIS — Z1231 Encounter for screening mammogram for malignant neoplasm of breast: Secondary | ICD-10-CM

## 2015-08-26 ENCOUNTER — Ambulatory Visit
Admission: RE | Admit: 2015-08-26 | Discharge: 2015-08-26 | Disposition: A | Discharge status: Home / Self Care | Payer: Medicare Other | Source: Ambulatory Visit | Attending: Internal Medicine | Admitting: Internal Medicine

## 2015-08-26 DIAGNOSIS — Z1231 Encounter for screening mammogram for malignant neoplasm of breast: Secondary | ICD-10-CM | POA: Diagnosis not present

## 2015-10-02 DIAGNOSIS — M79604 Pain in right leg: Secondary | ICD-10-CM | POA: Diagnosis not present

## 2015-10-02 DIAGNOSIS — E559 Vitamin D deficiency, unspecified: Secondary | ICD-10-CM | POA: Diagnosis not present

## 2015-10-02 DIAGNOSIS — N182 Chronic kidney disease, stage 2 (mild): Secondary | ICD-10-CM | POA: Diagnosis not present

## 2015-10-02 DIAGNOSIS — I129 Hypertensive chronic kidney disease with stage 1 through stage 4 chronic kidney disease, or unspecified chronic kidney disease: Secondary | ICD-10-CM | POA: Diagnosis not present

## 2015-10-02 DIAGNOSIS — M545 Low back pain: Secondary | ICD-10-CM | POA: Diagnosis not present

## 2015-10-02 DIAGNOSIS — Z0000 Encounter for general adult medical examination without abnormal findings: Secondary | ICD-10-CM | POA: Diagnosis not present

## 2015-10-02 DIAGNOSIS — R7309 Other abnormal glucose: Secondary | ICD-10-CM | POA: Diagnosis not present

## 2015-10-09 DIAGNOSIS — M79604 Pain in right leg: Secondary | ICD-10-CM | POA: Diagnosis not present

## 2015-12-19 DIAGNOSIS — Z23 Encounter for immunization: Secondary | ICD-10-CM | POA: Diagnosis not present

## 2016-01-14 ENCOUNTER — Ambulatory Visit (INDEPENDENT_AMBULATORY_CARE_PROVIDER_SITE_OTHER): Payer: Medicare Other | Admitting: Family Medicine

## 2016-01-14 VITALS — BP 116/80 | HR 97 | Temp 98.5°F | Resp 17 | Ht 64.0 in | Wt 235.0 lb

## 2016-01-14 DIAGNOSIS — L03011 Cellulitis of right finger: Secondary | ICD-10-CM | POA: Diagnosis not present

## 2016-01-14 MED ORDER — SULFAMETHOXAZOLE-TRIMETHOPRIM 800-160 MG PO TABS: 1.0000 | ORAL_TABLET | Freq: Two times a day (BID) | ORAL | 0 refills | Status: DC

## 2016-01-14 NOTE — Progress Notes (Signed)
Vanessa Ward is a 70 y.o. female who presents to [facility] today for finger injury  1.  Finger injury - Occurred 6 days ago while preparing thanksgiving dinner - she cut he finger while slicing yams with a serrated kitchen knife - she had used the same knife to cut [place] wings - she reports that her pain resolved couple of days after she injured her finger - this AM she noted her finger was more swollen and she developed bruising over her finger that was not there before - she denies reduced range of movement of the finger, weakness of her hand - she denies fevers/chills  ROS as above.  Pertinently, no chest pain, palpitations, SOB, Abd pain, N/V/D.   PMH reviewed. Patient is a nonsmoker.   Past Medical History:  Diagnosis Date  . Arthritis   . Chronic kidney disease    resent diagnosis of hematuria due to benigh kidney tumor  . GERD (gastroesophageal reflux disease)   . Hypertension   . Shingles    Past Surgical History:  Procedure Laterality Date  . BIOPSY  10/15/2011   Procedure: BIOPSY;  Surgeon: Lahoma Crocker, MD;  Location: [facility];  Service: Gynecology;;  Cervical  . CERVICAL CONIZATION W/BX  12/17/2011   Procedure: CONIZATION CERVIX WITH BIOPSY;  Surgeon: Lahoma Crocker, MD;  Location: [facility];  Service: Gynecology;  Laterality: N/A;  . COLPOSCOPY  10/15/2011   Procedure: COLPOSCOPY;  Surgeon: Lahoma Crocker, MD;  Location: [facility];  Service: Gynecology;  Laterality: N/A;  . DIAGNOSTIC LAPAROSCOPY    . TUBAL LIGATION      Medications reviewed. Current Outpatient Prescriptions  Medication Sig Dispense Refill  . cholecalciferol (VITAMIN D) 1000 UNITS tablet Take 2,000 Units by mouth daily.     . fluconazole (DIFLUCAN) 200 MG tablet Take 1 tablet (200 mg total) by mouth once. Repeat dose in 48-72 hours. 3 tablet 0  . lisinopril-hydrochlorothiazide (PRINZIDE,ZESTORETIC) 10-12.5 MG per tablet Take 1 tablet by mouth daily.    Marland Kitchen  OMEPRAZOLE PO Take 20 mg by mouth daily.    . rosuvastatin (CRESTOR) 10 MG tablet Take 10 mg by mouth daily.     No current facility-administered medications for this visit.      Physical Exam:  BP 116/80 (BP Location: Left Arm, Patient Position: Sitting, Cuff Size: Large)   Pulse 97   Temp 98.5 F (36.9 C) (Oral)   Resp 17   Ht 5\' 4"  (1.626 m)   Wt 235 lb (106.6 kg)   SpO2 93%   BMI 40.34 kg/m  Gen:  Alert, cooperative patient who appears stated age in no acute distress.  Vital signs reviewed. HEENT: EOMI,  MMM Pulm:  Clear to auscultation bilaterally with good air movement.  No wheezes or rales noted.   Cardiac:  Regular rate and rhythm without murmur auscultated.  Good S1/S2. Abd:  Soft/nondistended/nontender.  Good bowel sounds throughout all four quadrants.  No masses noted.  Exts: Right distal phalangeal swelling with healing 1-3 mm laceration over lateral aspect and hematoma over palmar aspect of it, , no tenderness, normal range of motion of the right index finger, digit warm well perfused    Assessment and Plan: Right index finger laceration, with concern for infection given worsening swelling and new hematoma. No evidence of tenosynovitis, or deep infection like felon or abscess  1.  Right laceration with uncomplicated celulitis - Bactrim DS BID x7 days - will follow with PCP in one week -  strict return precautions reviewed  Alyssa A. Lincoln Brigham MD, [facility] Resident PGY-3 Pager [phone]  I have reviewed the patient's chart and I agree with the resident's documentation.   Laroy Apple, MD 01/15/2016, 7:54 AM

## 2016-01-14 NOTE — Patient Instructions (Addendum)
Follow up with your PCP in the next week If you have worsening finger pain, swelling or fevers, return for evalution     IF you received an x-ray today, you will receive an invoice from [facility]. Please contact [facility] at [phone] with questions or concerns regarding your invoice.   IF you received labwork today, you will receive an invoice from [employer]. Please contact [facility] at [phone] with questions or concerns regarding your invoice.   Our billing staff will not be able to assist you with questions regarding bills from these companies.  You will be contacted with the lab results as soon as they are available. The fastest way to get your results is to activate your My Chart account. Instructions are located on the last page of this paperwork. If you have not heard from [place] regarding the results in 2 weeks, please contact this office.

## 2016-03-24 DIAGNOSIS — E785 Hyperlipidemia, unspecified: Secondary | ICD-10-CM | POA: Diagnosis not present

## 2016-03-24 DIAGNOSIS — I129 Hypertensive chronic kidney disease with stage 1 through stage 4 chronic kidney disease, or unspecified chronic kidney disease: Secondary | ICD-10-CM | POA: Diagnosis not present

## 2016-03-24 DIAGNOSIS — N182 Chronic kidney disease, stage 2 (mild): Secondary | ICD-10-CM | POA: Diagnosis not present

## 2016-03-24 DIAGNOSIS — R7309 Other abnormal glucose: Secondary | ICD-10-CM | POA: Diagnosis not present

## 2016-06-28 DIAGNOSIS — D3002 Benign neoplasm of left kidney: Secondary | ICD-10-CM | POA: Diagnosis not present

## 2016-07-30 ENCOUNTER — Other Ambulatory Visit: Payer: Self-pay | Admitting: Internal Medicine

## 2016-07-30 DIAGNOSIS — Z1231 Encounter for screening mammogram for malignant neoplasm of breast: Secondary | ICD-10-CM

## 2016-08-26 ENCOUNTER — Ambulatory Visit
Admission: RE | Admit: 2016-08-26 | Discharge: 2016-08-26 | Disposition: A | Discharge status: Home / Self Care | Payer: Medicare Other | Source: Ambulatory Visit | Attending: Internal Medicine | Admitting: Internal Medicine

## 2016-08-26 DIAGNOSIS — Z1231 Encounter for screening mammogram for malignant neoplasm of breast: Secondary | ICD-10-CM | POA: Diagnosis not present

## 2016-10-25 DIAGNOSIS — H9201 Otalgia, right ear: Secondary | ICD-10-CM | POA: Diagnosis not present

## 2016-11-09 DIAGNOSIS — Z23 Encounter for immunization: Secondary | ICD-10-CM | POA: Diagnosis not present

## 2016-11-09 DIAGNOSIS — R7309 Other abnormal glucose: Secondary | ICD-10-CM | POA: Diagnosis not present

## 2016-11-09 DIAGNOSIS — E559 Vitamin D deficiency, unspecified: Secondary | ICD-10-CM | POA: Diagnosis not present

## 2016-11-09 DIAGNOSIS — M79602 Pain in left arm: Secondary | ICD-10-CM | POA: Diagnosis not present

## 2016-11-09 DIAGNOSIS — I129 Hypertensive chronic kidney disease with stage 1 through stage 4 chronic kidney disease, or unspecified chronic kidney disease: Secondary | ICD-10-CM | POA: Diagnosis not present

## 2016-11-09 DIAGNOSIS — Z0000 Encounter for general adult medical examination without abnormal findings: Secondary | ICD-10-CM | POA: Diagnosis not present

## 2016-11-09 DIAGNOSIS — N182 Chronic kidney disease, stage 2 (mild): Secondary | ICD-10-CM | POA: Diagnosis not present

## 2016-11-16 ENCOUNTER — Other Ambulatory Visit: Payer: Self-pay | Admitting: Internal Medicine

## 2016-11-16 DIAGNOSIS — E2839 Other primary ovarian failure: Secondary | ICD-10-CM

## 2016-12-08 ENCOUNTER — Ambulatory Visit
Admission: RE | Admit: 2016-12-08 | Discharge: 2016-12-08 | Disposition: A | Discharge status: Home / Self Care | Payer: Medicare Other | Source: Ambulatory Visit | Attending: Internal Medicine | Admitting: Internal Medicine

## 2016-12-08 DIAGNOSIS — E2839 Other primary ovarian failure: Secondary | ICD-10-CM

## 2016-12-08 DIAGNOSIS — Z780 Asymptomatic menopausal state: Secondary | ICD-10-CM | POA: Diagnosis not present

## 2016-12-08 DIAGNOSIS — Z13820 Encounter for screening for osteoporosis: Secondary | ICD-10-CM | POA: Diagnosis not present

## 2017-01-20 ENCOUNTER — Other Ambulatory Visit: Payer: Self-pay

## 2017-05-05 DIAGNOSIS — N182 Chronic kidney disease, stage 2 (mild): Secondary | ICD-10-CM | POA: Diagnosis not present

## 2017-05-05 DIAGNOSIS — I129 Hypertensive chronic kidney disease with stage 1 through stage 4 chronic kidney disease, or unspecified chronic kidney disease: Secondary | ICD-10-CM | POA: Diagnosis not present

## 2017-05-05 DIAGNOSIS — E785 Hyperlipidemia, unspecified: Secondary | ICD-10-CM | POA: Diagnosis not present

## 2017-05-05 DIAGNOSIS — R7309 Other abnormal glucose: Secondary | ICD-10-CM | POA: Diagnosis not present

## 2017-05-05 DIAGNOSIS — J309 Allergic rhinitis, unspecified: Secondary | ICD-10-CM | POA: Diagnosis not present

## 2017-05-05 LAB — HEPATIC FUNCTION PANEL
ALT: 12 (ref 7–35)
AST: 15 (ref 13–35)
Alkaline Phosphatase: 57 (ref 25–125)
Bilirubin, Total: 0.5

## 2017-05-05 LAB — BASIC METABOLIC PANEL
Creatinine: 0.9 (ref 0.5–1.1)
Glucose: 87
Potassium: 3.9 (ref 3.4–5.3)
Sodium: 143 (ref 137–147)

## 2017-05-05 LAB — LIPID PANEL
Cholesterol: 169 (ref 0–200)
HDL: 62 (ref 35–70)
LDL Cholesterol: 84
Triglycerides: 115 (ref 40–160)

## 2017-05-05 LAB — HEMOGLOBIN A1C: Hemoglobin A1C: 5.7

## 2017-07-29 ENCOUNTER — Other Ambulatory Visit: Payer: Self-pay | Admitting: Internal Medicine

## 2017-07-29 DIAGNOSIS — Z1231 Encounter for screening mammogram for malignant neoplasm of breast: Secondary | ICD-10-CM

## 2017-08-29 ENCOUNTER — Ambulatory Visit
Admission: RE | Admit: 2017-08-29 | Discharge: 2017-08-29 | Disposition: A | Discharge status: Home / Self Care | Payer: Medicare Other | Source: Ambulatory Visit | Attending: Internal Medicine | Admitting: Internal Medicine

## 2017-08-29 DIAGNOSIS — Z1231 Encounter for screening mammogram for malignant neoplasm of breast: Secondary | ICD-10-CM | POA: Diagnosis not present

## 2017-12-03 ENCOUNTER — Encounter: Payer: Self-pay | Admitting: Internal Medicine

## 2017-12-03 DIAGNOSIS — E785 Hyperlipidemia, unspecified: Secondary | ICD-10-CM | POA: Insufficient documentation

## 2017-12-21 ENCOUNTER — Ambulatory Visit (INDEPENDENT_AMBULATORY_CARE_PROVIDER_SITE_OTHER): Payer: Medicare Other | Admitting: Internal Medicine

## 2017-12-21 ENCOUNTER — Encounter: Payer: Self-pay | Admitting: Internal Medicine

## 2017-12-21 ENCOUNTER — Ambulatory Visit (INDEPENDENT_AMBULATORY_CARE_PROVIDER_SITE_OTHER): Payer: Medicare Other

## 2017-12-21 ENCOUNTER — Ambulatory Visit: Payer: Medicare Other | Admitting: Internal Medicine

## 2017-12-21 VITALS — BP 168/98 | HR 97 | Temp 97.9°F | Ht 63.5 in | Wt 245.0 lb

## 2017-12-21 DIAGNOSIS — Z1211 Encounter for screening for malignant neoplasm of colon: Secondary | ICD-10-CM

## 2017-12-21 DIAGNOSIS — R040 Epistaxis: Secondary | ICD-10-CM | POA: Diagnosis not present

## 2017-12-21 DIAGNOSIS — I129 Hypertensive chronic kidney disease with stage 1 through stage 4 chronic kidney disease, or unspecified chronic kidney disease: Secondary | ICD-10-CM

## 2017-12-21 DIAGNOSIS — Z6841 Body Mass Index (BMI) 40.0 and over, adult: Secondary | ICD-10-CM

## 2017-12-21 DIAGNOSIS — E7800 Pure hypercholesterolemia, unspecified: Secondary | ICD-10-CM

## 2017-12-21 DIAGNOSIS — G44229 Chronic tension-type headache, not intractable: Secondary | ICD-10-CM | POA: Diagnosis not present

## 2017-12-21 DIAGNOSIS — Z23 Encounter for immunization: Secondary | ICD-10-CM | POA: Diagnosis not present

## 2017-12-21 DIAGNOSIS — N182 Chronic kidney disease, stage 2 (mild): Secondary | ICD-10-CM

## 2017-12-21 DIAGNOSIS — R7309 Other abnormal glucose: Secondary | ICD-10-CM

## 2017-12-21 DIAGNOSIS — Z0000 Encounter for general adult medical examination without abnormal findings: Secondary | ICD-10-CM | POA: Diagnosis not present

## 2017-12-21 DIAGNOSIS — R1031 Right lower quadrant pain: Secondary | ICD-10-CM | POA: Diagnosis not present

## 2017-12-21 NOTE — Patient Instructions (Signed)
Vanessa Ward , Thank you for taking time to come for your Medicare Wellness Visit. I appreciate your ongoing commitment to your health goals. Please review the following plan we discussed and let me know if I can assist you in the future.   Screening recommendations/referrals: Colonoscopy: 02/2008 Mammogram: 08/2017 Bone Density: 11/2016 Recommended yearly ophthalmology/optometry visit for glaucoma screening and checkup Recommended yearly dental visit for hygiene and checkup  Vaccinations: Influenza vaccine: today Pneumococcal vaccine: 06/2013 Tdap vaccine: 05/2012 Shingles vaccine:  dec    Advanced directives: Advance directive discussed with you today. I have provided a copy for you to complete at home and have notarized. Once this is complete please bring a copy in to our office so we can scan it into your chart.   Conditions/risks identified: Obesity: Patient does not have any goes set for herself this year. She does not have a current exercise routine.  Next appointment: 01/04/2018 at 9:30a   Preventive Care 65 Years and Older, Female Preventive care refers to lifestyle choices and visits with your health care provider that can promote health and wellness. What does preventive care include?  A yearly physical exam. This is also called an annual well check.  Dental exams once or twice a year.  Routine eye exams. Ask your health care provider how often you should have your eyes checked.  Personal lifestyle choices, including:  Daily care of your teeth and gums.  Regular physical activity.  Eating a healthy diet.  Avoiding tobacco and drug use.  Limiting alcohol use.  Practicing safe sex.  Taking low-dose aspirin every day.  Taking vitamin and mineral supplements as recommended by your health care provider. What happens during an annual well check? The services and screenings done by your health care provider during your annual well check will depend on your age,  overall health, lifestyle risk factors, and family history of disease. Counseling  Your health care provider may ask you questions about your:  Alcohol use.  Tobacco use.  Drug use.  Emotional well-being.  Home and relationship well-being.  Sexual activity.  Eating habits.  History of falls.  Memory and ability to understand (cognition).  Work and work Statistician.  Reproductive health. Screening  You may have the following tests or measurements:  Height, weight, and BMI.  Blood pressure.  Lipid and cholesterol levels. These may be checked every 5 years, or more frequently if you are over 75 years old.  Skin check.  Lung cancer screening. You may have this screening every year starting at age 72 if you have a 30-pack-year history of smoking and currently smoke or have quit within the past 15 years.  Fecal occult blood test (FOBT) of the stool. You may have this test every year starting at age 62.  Flexible sigmoidoscopy or colonoscopy. You may have a sigmoidoscopy every 5 years or a colonoscopy every 10 years starting at age 31.  Hepatitis C blood test.  Hepatitis B blood test.  Sexually transmitted disease (STD) testing.  Diabetes screening. This is done by checking your blood sugar (glucose) after you have not eaten for a while (fasting). You may have this done every 1-3 years.  Bone density scan. This is done to screen for osteoporosis. You may have this done starting at age 25.  Mammogram. This may be done every 1-2 years. Talk to your health care provider about how often you should have regular mammograms. Talk with your health care provider about your test results, treatment options, and  if necessary, the need for more tests. Vaccines  Your health care provider may recommend certain vaccines, such as:  Influenza vaccine. This is recommended every year.  Tetanus, diphtheria, and acellular pertussis (Tdap, Td) vaccine. You may need a Td booster every 10  years.  Zoster vaccine. You may need this after age 52.  Pneumococcal 13-valent conjugate (PCV13) vaccine. One dose is recommended after age 82.  Pneumococcal polysaccharide (PPSV23) vaccine. One dose is recommended after age 40. Talk to your health care provider about which screenings and vaccines you need and how often you need them. This information is not intended to replace advice given to you by your health care provider. Make sure you discuss any questions you have with your health care provider. Document Released: 02/28/2015 Document Revised: 10/22/2015 Document Reviewed: 12/03/2014 Elsevier Interactive Patient Education  2017 [place] Prevention in the Home Falls can cause injuries. They can happen to people of all ages. There are many things you can do to make your home safe and to help prevent falls. What can I do on the outside of my home?  Regularly fix the edges of walkways and driveways and fix any cracks.  Remove anything that might make you trip as you walk through a door, such as a raised step or threshold.  Trim any bushes or trees on the path to your home.  Use bright outdoor lighting.  Clear any walking paths of anything that might make someone trip, such as rocks or tools.  Regularly check to see if handrails are loose or broken. Make sure that both sides of any steps have handrails.  Any raised decks and porches should have guardrails on the edges.  Have any leaves, snow, or ice cleared regularly.  Use sand or salt on walking paths during winter.  Clean up any spills in your garage right away. This includes oil or grease spills. What can I do in the bathroom?  Use night lights.  Install grab bars by the toilet and in the tub and shower. Do not use towel bars as grab bars.  Use non-skid mats or decals in the tub or shower.  If you need to sit down in the shower, use a plastic, non-slip stool.  Keep the floor dry. Clean up any water that  spills on the floor as soon as it happens.  Remove soap buildup in the tub or shower regularly.  Attach bath mats securely with double-sided non-slip rug tape.  Do not have throw rugs and other things on the floor that can make you trip. What can I do in the bedroom?  Use night lights.  Make sure that you have a light by your bed that is easy to reach.  Do not use any sheets or blankets that are too big for your bed. They should not hang down onto the floor.  Have a firm chair that has side arms. You can use this for support while you get dressed.  Do not have throw rugs and other things on the floor that can make you trip. What can I do in the kitchen?  Clean up any spills right away.  Avoid walking on wet floors.  Keep items that you use a lot in easy-to-reach places.  If you need to reach something above you, use a strong step stool that has a grab bar.  Keep electrical cords out of the way.  Do not use floor polish or wax that makes floors slippery. If you  must use wax, use non-skid floor wax.  Do not have throw rugs and other things on the floor that can make you trip. What can I do with my stairs?  Do not leave any items on the stairs.  Make sure that there are handrails on both sides of the stairs and use them. Fix handrails that are broken or loose. Make sure that handrails are as long as the stairways.  Check any carpeting to make sure that it is firmly attached to the stairs. Fix any carpet that is loose or worn.  Avoid having throw rugs at the top or bottom of the stairs. If you do have throw rugs, attach them to the floor with carpet tape.  Make sure that you have a light switch at the top of the stairs and the bottom of the stairs. If you do not have them, ask someone to add them for you. What else can I do to help prevent falls?  Wear shoes that:  Do not have high heels.  Have rubber bottoms.  Are comfortable and fit you well.  Are closed at the  toe. Do not wear sandals.  If you use a stepladder:  Make sure that it is fully opened. Do not climb a closed stepladder.  Make sure that both sides of the stepladder are locked into place.  Ask someone to hold it for you, if possible.  Clearly mark and make sure that you can see:  Any grab bars or handrails.  First and last steps.  Where the edge of each step is.  Use tools that help you move around (mobility aids) if they are needed. These include:  Canes.  Walkers.  Scooters.  Crutches.  Turn on the lights when you go into a dark area. Replace any light bulbs as soon as they burn out.  Set up your furniture so you have a clear path. Avoid moving your furniture around.  If any of your floors are uneven, fix them.  If there are any pets around you, be aware of where they are.  Review your medicines with your doctor. Some medicines can make you feel dizzy. This can increase your chance of falling. Ask your doctor what other things that you can do to help prevent falls. This information is not intended to replace advice given to you by your health care provider. Make sure you discuss any questions you have with your health care provider. Document Released: 11/28/2008 Document Revised: 07/10/2015 Document Reviewed: 03/08/2014 Elsevier Interactive Patient Education  2017 [employer].

## 2017-12-21 NOTE — Patient Instructions (Signed)

## 2017-12-21 NOTE — Progress Notes (Signed)
Subjective:     Patient ID: Vanessa Ward , female    DOB: May 25, 1945 , 72 y.o.   MRN: [id number]   Chief Complaint  Patient presents with  . Hypertension  . Hyperlipidemia    HPI  Hypertension  This is a chronic problem. The current episode started more than 1 year ago. The problem has been gradually improving since onset. The problem is controlled. Associated symptoms include headaches. Pertinent negatives include no blurred vision, chest pain, palpitations or PND. Risk factors for coronary artery disease include obesity, post-menopausal state, sedentary lifestyle and dyslipidemia. The current treatment provides moderate improvement. Compliance problems include exercise.     HYPERLIPIDEMIA  She is taking Rosuvastatin without any issues.   Past Medical History:  Diagnosis Date  . Arthritis   . Chronic kidney disease    resent diagnosis of hematuria due to benigh kidney tumor  . GERD (gastroesophageal reflux disease)   . Hypertension   . Shingles      Family History  Problem Relation Age of Onset  . Hypertension Mother   . Heart disease Mother   . Hypertension Father   . Diabetes Paternal Grandmother      Current Outpatient Medications:  .  cholecalciferol (VITAMIN D) 1000 UNITS tablet, Take 2,000 Units by mouth daily. , Disp: , Rfl:  .  fluconazole (DIFLUCAN) 200 MG tablet, Take 1 tablet (200 mg total) by mouth once. Repeat dose in 48-72 hours. (Patient not taking: Reported on 12/21/2017), Disp: 3 tablet, Rfl: 0 .  lisinopril-hydrochlorothiazide (PRINZIDE,ZESTORETIC) 10-12.5 MG per tablet, Take 1 tablet by mouth daily., Disp: , Rfl:  .  OMEPRAZOLE PO, Take 20 mg by mouth daily., Disp: , Rfl:  .  rosuvastatin (CRESTOR) 10 MG tablet, Take 10 mg by mouth daily., Disp: , Rfl:  .  sulfamethoxazole-trimethoprim (BACTRIM DS,SEPTRA DS) 800-160 MG tablet, Take 1 tablet by mouth 2 (two) times daily. (Patient not taking: Reported on 12/21/2017), Disp: 14 tablet, Rfl: 0    Allergies  Allergen Reactions  . Other Rash    Perfume, scented lotion     Review of Systems  Constitutional: Negative.   Eyes: Negative for blurred vision.  Respiratory: Negative.   Cardiovascular: Negative.  Negative for chest pain, palpitations and PND.  Gastrointestinal: Positive for abdominal pain (c/o RLQ pain - concerned b/c some of her friends have been dx'd w/ pancreatic CA).  Genitourinary: Negative.   Neurological: Positive for headaches.  Psychiatric/Behavioral: Negative.      Today's Vitals   12/21/17 1109  BP: (!) 168/98  Pulse: 97  Temp: 97.9 F (36.6 C)  TempSrc: Oral  Weight: 245 lb (111.1 kg)  Height: 5' 3.5" (1.613 m)  PainSc: 0-No pain   Body mass index is 42.72 kg/m.   Objective:  Physical Exam  Constitutional: She is oriented to person, place, and time. She appears well-developed and well-nourished.  HENT:  Head: Normocephalic and atraumatic.  Nose: Nose normal.  Eyes: EOM are normal.  Cardiovascular: Normal rate, regular rhythm and normal heart sounds.  Pulmonary/Chest: Effort normal and breath sounds normal.  Neurological: She is alert and oriented to person, place, and time.  Psychiatric: She has a normal mood and affect.  Nursing note and vitals reviewed.       Assessment And Plan:     1. Hypertensive nephropathy  Uncontrolled. She is encouraged to avoid adding salt to her foods. She is also advised to start taking TWO tablets of lisinopril/hctz 10/12.85m once daily. She will rto in  four weeks for re-evaluation. If no improvement, I will switch her to an ARB/diuretic combo.   - CMP14+EGFR  2. Chronic renal disease, stage II  Chronic. Importance of tight bp control to avoid progression of disease was discussed with the patient.   3. Pure hypercholesterolemia  AGAIN, SHE IS ENCOURAGED TO EXERCISE FIVE DAYS WEEKLY FOR AT LEAST 30 MINUTES, AVOID FRIED FOODS, EAT 25-35 GRAMS OF FIBER, AND TO EAT FISH AT LEAST TWICE WEEKLY.  -  Lipid Profile  4. Chronic tension-type headache, not intractable  She is encouraged to start magnesium nightly and to increase her water intake. Again, this could be due to her elevated bp as well.   5. Epistaxis  She is encouraged to place Vaseline at tips of nostrils nightly, pinch nostrils together to spread Vaseline. Pt also advised that her sx could be due to elevated blood pressure.   6. Other abnormal glucose  HER A1C HAS BEEN ELEVATED IN THE PAST. I WILL CHECK AN A1C, BMET TODAY. SHE WAS ENCOURAGED TO AVOID SUGARY BEVERAGES AND PROCESSED FOODS INCLUDNG BREADS, RICE AND PASTA.  - Hemoglobin A1c  7. RLQ abdominal pain  I will refer her to GI for further evaluation.   8. Class 3 severe obesity due to excess calories with serious comorbidity and body mass index (BMI) of 40.0 to 44.9 in adult [facility])  She is encouraged to strive to lose 15 pounds over the next 3-4 months. She is encouraged to incorporate no less than 30 minutes of exercise five days weekly. She is also encouraged to avoid sugary beverages.   9. Screen for colon cancer  I will refer her to GI for CRC screening.   [name], MD

## 2017-12-21 NOTE — Progress Notes (Addendum)
Subjective:   Vanessa Ward is a 72 y.o. female who presents for Medicare Annual (Subsequent) preventive examination.  Review of Systems:  n/a Cardiac Risk Factors include: advanced age (>67men, >54 women);hypertension;obesity (BMI >30kg/m2)     Objective:     Vitals: BP (!) 168/98 (BP Location: Left Arm)   Pulse 97   Temp 97.9 F (36.6 C)   Ht 5' 3.5" (1.613 m)   Wt 245 lb (111.1 kg)   BMI 42.72 kg/m   Body mass index is 42.72 kg/m.  Advanced Directives 12/21/2017 02/06/2014 12/13/2011 10/05/2011  Does Patient Have a Medical Advance Directive? No No Patient does not have advance directive Patient does not have advance directive  Would patient like information on creating a medical advance directive? Yes (MAU/Ambulatory/Procedural Areas - Information given) No - patient declined information - -    Tobacco Social History   Tobacco Use  Smoking Status Former Smoker  Smokeless Tobacco Never Used     Counseling given: Not Answered   Clinical Intake:  Pre-visit preparation completed: Yes  Pain : No/denies pain Pain Score: 0-No pain        How often do you need to have someone help you when you read instructions, pamphlets, or other written materials from your doctor or pharmacy?: 1 - Never What is the last grade level you completed in school?: High school  Interpreter Needed?: No  Information entered by :: NAllen LPN  Past Medical History:  Diagnosis Date  . Arthritis   . Chronic kidney disease    resent diagnosis of hematuria due to benigh kidney tumor  . GERD (gastroesophageal reflux disease)   . Hypertension   . Shingles    Past Surgical History:  Procedure Laterality Date  . BIOPSY  10/15/2011   Procedure: BIOPSY;  Surgeon: Lahoma Crocker, MD;  Location: [facility];  Service: Gynecology;;  Cervical  . CERVICAL CONIZATION W/BX  12/17/2011   Procedure: CONIZATION CERVIX WITH BIOPSY;  Surgeon: Lahoma Crocker, MD;  Location: [facility];  Service:  Gynecology;  Laterality: N/A;  . COLPOSCOPY  10/15/2011   Procedure: COLPOSCOPY;  Surgeon: Lahoma Crocker, MD;  Location: [facility];  Service: Gynecology;  Laterality: N/A;  . DIAGNOSTIC LAPAROSCOPY    . TUBAL LIGATION     Family History  Problem Relation Age of Onset  . Hypertension Mother   . Heart disease Mother   . Hypertension Father   . Diabetes Paternal Grandmother    Social History   Socioeconomic History  . Marital status: Single    Spouse name: Not on file  . Number of children: Not on file  . Years of education: Not on file  . Highest education level: Not on file  Occupational History  . Not on file  Social Needs  . Financial resource strain: Not hard at all  . Food insecurity:    Worry: Never true    Inability: Never true  . Transportation needs:    Medical: No    Non-medical: No  Tobacco Use  . Smoking status: Former Research scientist (life sciences)  . Smokeless tobacco: Never Used  Substance and Sexual Activity  . Alcohol use: Yes    Alcohol/week: 0.0 standard drinks    Comment: rarely  . Drug use: No  . Sexual activity: Not Currently    Birth control/protection: Post-menopausal  Lifestyle  . Physical activity:    Days per week: 0 days    Minutes per session: 0 min  . Stress: Not at all  Relationships  .  Social connections:    Talks on phone: Not on file    Gets together: Not on file    Attends religious service: Not on file    Active member of club or organization: Not on file    Attends meetings of clubs or organizations: Not on file    Relationship status: Not on file  Other Topics Concern  . Not on file  Social History Narrative  . Not on file    Outpatient Encounter Medications as of 12/21/2017  Medication Sig  . cholecalciferol (VITAMIN D) 1000 UNITS tablet Take 2,000 Units by mouth daily.   Marland Kitchen lisinopril-hydrochlorothiazide (PRINZIDE,ZESTORETIC) 10-12.5 MG per tablet Take 1 tablet by mouth daily.  Marland Kitchen OMEPRAZOLE PO Take 20 mg by mouth daily.  . rosuvastatin  (CRESTOR) 10 MG tablet Take 10 mg by mouth daily.  . fluconazole (DIFLUCAN) 200 MG tablet Take 1 tablet (200 mg total) by mouth once. Repeat dose in 48-72 hours. (Patient not taking: Reported on 12/21/2017)  . sulfamethoxazole-trimethoprim (BACTRIM DS,SEPTRA DS) 800-160 MG tablet Take 1 tablet by mouth 2 (two) times daily. (Patient not taking: Reported on 12/21/2017)   No facility-administered encounter medications on file as of 12/21/2017.     Activities of Daily Living In your present state of health, do you have any difficulty performing the following activities: 12/21/2017  Hearing? N  Vision? N  Difficulty concentrating or making decisions? N  Walking or climbing stairs? N  Dressing or bathing? N  Doing errands, shopping? N  Preparing Food and eating ? N  Using the Toilet? N  In the past six months, have you accidently leaked urine? N  Do you have problems with loss of bowel control? N  Managing your Medications? N  Managing your Finances? N  Housekeeping or managing your Housekeeping? N  Some recent data might be hidden    Patient Care Team: Glendale Chard, MD as PCP - General (Internal Medicine)    Assessment:   This is a routine wellness examination for [name].  Exercise Activities and Dietary recommendations Current Exercise Habits: The patient does not participate in regular exercise at present, Exercise limited by: None identified  Goals   None     Fall Risk Fall Risk  12/21/2017 01/20/2017 01/14/2016  Falls in the past year? 1 No -  Comment - Emmi Telephone Survey: data to providers prior to load -  Number falls in past yr: 0 - 1  Injury with Fall? 0 - No  Risk for fall due to : History of fall(s);Medication side effect - -   Is the patient's home free of loose throw rugs in walkways, pet beds, electrical cords, etc?   yes      Grab bars in the bathroom? no      Handrails on the stairs?  n/a      Adequate lighting?   yes  Timed Get Up and Go performed:  n/a  Depression Screen PHQ 2/9 Scores 12/21/2017 01/14/2016  PHQ - 2 Score 0 0     Cognitive Function     6CIT Screen 12/21/2017  What Year? 0 points  What month? 0 points  What time? 3 points  Count back from 20 0 points  Months in reverse 0 points  Repeat phrase 0 points  Total Score 3    Immunization History  Administered Date(s) Administered  . Influenza, High Dose Seasonal PF 12/21/2017    Qualifies for Shingles Vaccine? yes  Screening Tests Health Maintenance  Topic Date Due  .  Hepatitis C Screening  10/26/1945  . COLONOSCOPY  03/11/2018  . MAMMOGRAM  08/30/2019  . TETANUS/TDAP  06/13/2022  . INFLUENZA VACCINE  Completed  . DEXA SCAN  Completed  . PNA vac Low Risk Adult  Completed    Cancer Screenings: Lung: Low Dose CT Chest recommended if Age 60-80 years, 30 pack-year currently smoking OR have quit w/in 15years. Patient does not qualify. Breast:  Up to date on Mammogram? Yes   Up to date of Bone Density/Dexa? Yes Colorectal: up to date  Additional Screenings: : Hepatitis C Screening: due     Plan:   States she had a fall. Denies any injury. Patient does not have any goals that she wants to make at this time. Flu vaccine administered today.  I have personally reviewed and noted the following in the patient's chart:   . Medical and social history . Use of alcohol, tobacco or illicit drugs  . Current medications and supplements . Functional ability and status . Nutritional status . Physical activity . Advanced directives . List of other physicians . Hospitalizations, surgeries, and ER visits in previous 12 months . Vitals . Screenings to include cognitive, depression, and falls . Referrals and appointments  In addition, I have reviewed and discussed with patient certain preventive protocols, quality metrics, and best practice recommendations. A written personalized care plan for preventive services as well as general preventive health  recommendations were provided to patient.     Kellie Simmering, LPN  17/04/5668

## 2017-12-22 LAB — LIPID PANEL
Chol/HDL Ratio: 2.7 ratio (ref 0.0–4.4)
Cholesterol, Total: 160 mg/dL (ref 100–199)
HDL: 60 mg/dL (ref >39)
LDL Calculated: 84 mg/dL (ref 0–99)
Triglycerides: 80 mg/dL (ref 0–149)
VLDL Cholesterol Cal: 16 mg/dL (ref 5–40)

## 2017-12-22 LAB — CMP14+EGFR
ALT: 19 IU/L (ref 0–32)
AST: 15 IU/L (ref 0–40)
Albumin/Globulin Ratio: 1.5 (ref 1.2–2.2)
Albumin: 4.1 g/dL (ref 3.5–4.8)
Alkaline Phosphatase: 60 IU/L (ref 39–117)
BUN/Creatinine Ratio: 13 (ref 12–28)
BUN: 11 mg/dL (ref 8–27)
Bilirubin Total: 0.5 mg/dL (ref 0.0–1.2)
CO2: 23 mmol/L (ref 20–29)
Calcium: 8.8 mg/dL (ref 8.7–10.3)
Chloride: 103 mmol/L (ref 96–106)
Creatinine, Ser: 0.86 mg/dL (ref 0.57–1.00)
GFR calc Af Amer: 79 mL/min/{1.73_m2} (ref >59)
GFR calc non Af Amer: 68 mL/min/{1.73_m2} (ref >59)
Globulin, Total: 2.8 g/dL (ref 1.5–4.5)
Glucose: 85 mg/dL (ref 65–99)
Potassium: 4 mmol/L (ref 3.5–5.2)
Sodium: 141 mmol/L (ref 134–144)
Total Protein: 6.9 g/dL (ref 6.0–8.5)

## 2017-12-22 LAB — CBC
Hematocrit: 35.8 % (ref 34.0–46.6)
Hemoglobin: 11.4 g/dL (ref 11.1–15.9)
MCH: 25.6 pg — ABNORMAL LOW (ref 26.6–33.0)
MCHC: 31.8 g/dL (ref 31.5–35.7)
MCV: 80 fL (ref 79–97)
Platelets: 303 10*3/uL (ref 150–450)
RBC: 4.46 x10E6/uL (ref 3.77–5.28)
RDW: 13.7 % (ref 12.3–15.4)
WBC: 7 10*3/uL (ref 3.4–10.8)

## 2017-12-22 LAB — HEMOGLOBIN A1C
Est. average glucose Bld gHb Est-mCnc: 117 mg/dL
Hgb A1c MFr Bld: 5.7 % — ABNORMAL HIGH (ref 4.8–5.6)

## 2017-12-25 NOTE — Progress Notes (Signed)
Your blood count is nl. Your liver and kidney fxn are stable. Your chol is great. Continue with current meds. Your a1c is 5.7, normal is 5.6. You are almost there! Pls walk 30 minutes five days weekly - IN ADDITION to other things you do to stay active.

## 2017-12-26 ENCOUNTER — Telehealth: Payer: Self-pay

## 2017-12-26 NOTE — Telephone Encounter (Signed)
-----   Message from Glendale Chard, MD sent at 12/25/2017  8:01 PM EST ----- Your blood count is nl. Your liver and kidney fxn are stable. Your chol is great. Continue with current meds. Your a1c is 5.7, normal is 5.6. You are almost there! Pls walk 30 minutes five days weekly - IN ADDITION to other things you do to stay active.

## 2017-12-26 NOTE — Telephone Encounter (Signed)
Left the pt a message to call back for her lab results. 

## 2018-01-02 ENCOUNTER — Other Ambulatory Visit: Payer: Self-pay

## 2018-01-04 ENCOUNTER — Ambulatory Visit (INDEPENDENT_AMBULATORY_CARE_PROVIDER_SITE_OTHER): Payer: Medicare Other | Admitting: Internal Medicine

## 2018-01-04 ENCOUNTER — Encounter: Payer: Self-pay | Admitting: Internal Medicine

## 2018-01-04 VITALS — BP 116/78 | HR 78 | Temp 97.9°F | Ht 63.5 in | Wt 241.4 lb

## 2018-01-04 DIAGNOSIS — I10 Essential (primary) hypertension: Secondary | ICD-10-CM

## 2018-01-04 DIAGNOSIS — R040 Epistaxis: Secondary | ICD-10-CM | POA: Diagnosis not present

## 2018-01-04 DIAGNOSIS — Z79899 Other long term (current) drug therapy: Secondary | ICD-10-CM | POA: Diagnosis not present

## 2018-01-04 LAB — BMP8+EGFR
BUN/Creatinine Ratio: 20 (ref 12–28)
BUN: 20 mg/dL (ref 8–27)
CO2: 24 mmol/L (ref 20–29)
Calcium: 9 mg/dL (ref 8.7–10.3)
Chloride: 100 mmol/L (ref 96–106)
Creatinine, Ser: 1.01 mg/dL — ABNORMAL HIGH (ref 0.57–1.00)
GFR calc Af Amer: 65 mL/min/{1.73_m2} (ref >59)
GFR calc non Af Amer: 56 mL/min/{1.73_m2} — ABNORMAL LOW (ref >59)
Glucose: 103 mg/dL — ABNORMAL HIGH (ref 65–99)
Potassium: 3.6 mmol/L (ref 3.5–5.2)
Sodium: 138 mmol/L (ref 134–144)

## 2018-01-04 MED ORDER — LISINOPRIL-HYDROCHLOROTHIAZIDE 20-25 MG PO TABS: 1.0000 | ORAL_TABLET | Freq: Every day | ORAL | 2 refills | Status: DC

## 2018-01-04 NOTE — Progress Notes (Signed)
  Subjective:     Patient ID: Vanessa Ward , female    DOB: Nov 17, 1945 , 72 y.o.   MRN: [id number]   Chief Complaint  Patient presents with  . Hypertension    HPI  Hypertension  This is a chronic problem. The current episode started more than 1 year ago. The problem has been gradually improving since onset. The problem is controlled. Pertinent negatives include no blurred vision, chest pain, neck pain, orthopnea or palpitations.  She is here today for bp f/u. Due to elevated bp, she was advised to take two tablets of lisinopril/hctz. She has tolerated increase dose w/o any side effects.   Past Medical History:  Diagnosis Date  . Arthritis   . Chronic kidney disease    resent diagnosis of hematuria due to benigh kidney tumor  . GERD (gastroesophageal reflux disease)   . Hypertension   . Shingles      Family History  Problem Relation Age of Onset  . Hypertension Mother   . Heart disease Mother   . Hypertension Father   . Diabetes Paternal Grandmother      Current Outpatient Medications:  .  lisinopril-hydrochlorothiazide (PRINZIDE,ZESTORETIC) 20-25 MG tablet, Take 1 tablet by mouth daily., Disp: 90 tablet, Rfl: 2 .  OMEPRAZOLE PO, Take 20 mg by mouth daily., Disp: , Rfl:  .  rosuvastatin (CRESTOR) 10 MG tablet, Take 10 mg by mouth daily., Disp: , Rfl:    Allergies  Allergen Reactions  . Other Rash    Perfume, scented lotion     Review of Systems  Constitutional: Negative.   HENT: Positive for nosebleeds (she has had 1 or 2 nosebleeds since last visit).   Eyes: Negative for blurred vision.  Respiratory: Negative.   Cardiovascular: Negative.  Negative for chest pain, palpitations and orthopnea.  Gastrointestinal: Negative.   Musculoskeletal: Negative for neck pain.  Neurological: Negative.   Psychiatric/Behavioral: Negative.      Today's Vitals   01/04/18 0940  BP: 116/78  Pulse: 78  Temp: 97.9 F (36.6 C)  TempSrc: Oral  Weight: 241 lb 6.4 oz (109.5  kg)  Height: 5' 3.5" (1.613 m)   Body mass index is 42.09 kg/m.   Objective:  Physical Exam  Constitutional: She is oriented to person, place, and time. She appears well-developed and well-nourished.  HENT:  Head: Normocephalic and atraumatic.  Eyes: EOM are normal.  Cardiovascular: Normal rate, regular rhythm and normal heart sounds.  Neurological: She is alert and oriented to person, place, and time.  Psychiatric: She has a normal mood and affect.  Nursing note and vitals reviewed.       Assessment And Plan:     1. Essential hypertension, benign  Well controlled. She will continue with Lisinopril/hctz 20/'25mg'$  once daily.  Refills were sent to the pharmacy. She will rto in 3 months for re-evaluation.  She is encouraged to exercise no less than four days weekly.   - BMP8+EGFR  2. Epistaxis  I will refer her to ENT for further evaluation since her sx are persistent despite improved bp readings.  3. Drug therapy   [name], MD

## 2018-01-04 NOTE — Patient Instructions (Signed)
Nosebleed, Adult A nosebleed is when blood comes out of the nose. Nosebleeds are common. Usually, they are not a sign of a serious condition. Nosebleeds can happen if a small blood vessel in your nose starts to bleed or if the lining of your nose (mucous membrane) cracks. They are commonly caused by:  Allergies.  Colds.  Picking your nose.  Blowing your nose too hard.  An injury from sticking an object into your nose or getting hit in the nose.  Dry or cold air.  Less common causes of nosebleeds include:  Toxic fumes.  Something abnormal in the nose or in the air-filled spaces in the bones of the face (sinuses).  Growths in the nose, such as polyps.  Medicines or conditions that cause blood to clot slowly.  Certain illnesses or procedures that irritate or dry out the nasal passages.  Follow these instructions at home: When you have a nosebleed:  Sit down and tilt your head slightly forward.  Use a clean towel or tissue to pinch your nostrils under the bony part of your nose. After 10 minutes, let go of your nose and see if bleeding starts again. Do not release pressure before that time. If there is still bleeding, repeat the pinching and holding for 10 minutes until the bleeding stops.  Do not place tissues or gauze in the nose to stop bleeding.  Avoid lying down and avoid tilting your head backward. That may make blood collect in the throat and cause gagging or coughing.  Use a nasal spray decongestant to help with a nosebleed as told by your health care provider.  Do not use petroleum jelly or mineral oil in your nose. It can drip into your lungs. After a nosebleed:  Avoid blowing your nose or sniffing for a number of hours.  Avoid straining, lifting, or bending at the waist for several days. You may resume other normal activities as you are able.  Use saline spray or a humidifier as told by your health care provider.  Aspirinand blood thinners make bleeding more  likely. If you are prescribed these medicines and you suffer from nosebleeds: ? Ask your health care provider if you should stop taking the medicines or if you should adjust the dose. ? Do not stop taking medicines that your health care provider has recommended unless told by your health care provider.  If your nosebleed was caused by dry mucous membranes, use over-the-counter saline nasal spray or gel. This will keep the mucous membranes moist and allow them to heal. If you must use a lubricant: ? Choose one that is water-soluble. ? Use only as much as you need and use it only as often as needed. ? Do not lie down until several hours after you use it. Contact a health care provider if:  You have a fever.  You get nosebleeds often or more often than usual.  You bruise very easily.  You have a nosebleed from having something stuck in your nose.  You have bleeding in your mouth.  You vomit or cough up brown material.  You have a nosebleed after you start a new medicine. Get help right away if:  You have a nosebleed after a fall or a head injury.  Your nosebleed does not go away after 20 minutes.  You feel dizzy or weak.  You have unusual bleeding from other parts of your body.  You have unusual bruising on other parts of your body.  You become sweaty.    You vomit blood. This information is not intended to replace advice given to you by your health care provider. Make sure you discuss any questions you have with your health care provider. Document Released: 11/11/2004 Document Revised: 10/02/2015 Document Reviewed: 08/19/2015 Elsevier Interactive Patient Education  2018 [employer] Inc[employer]  

## 2018-01-06 NOTE — Progress Notes (Signed)
Your kidney fxn is stable, but has decreased since last visit. Please increase water intake.

## 2018-01-08 ENCOUNTER — Other Ambulatory Visit: Payer: Self-pay

## 2018-01-08 ENCOUNTER — Encounter (HOSPITAL_COMMUNITY): Payer: Self-pay | Admitting: Emergency Medicine

## 2018-01-08 ENCOUNTER — Ambulatory Visit (HOSPITAL_COMMUNITY)
Admission: EM | Admit: 2018-01-08 | Discharge: 2018-01-08 | Disposition: A | Discharge status: Home / Self Care | Payer: Medicare Other | Attending: Family Medicine | Admitting: Family Medicine

## 2018-01-08 DIAGNOSIS — M779 Enthesopathy, unspecified: Secondary | ICD-10-CM

## 2018-01-08 DIAGNOSIS — M778 Other enthesopathies, not elsewhere classified: Secondary | ICD-10-CM

## 2018-01-08 MED ORDER — NAPROXEN 375 MG PO TABS: 375.0000 mg | ORAL_TABLET | Freq: Two times a day (BID) | ORAL | 0 refills | Status: DC | PRN

## 2018-01-08 NOTE — ED Triage Notes (Signed)
Pt states she had blood drawn in her [place] on Wednesday.  She states she felt a "pop" in her left wrist when they stuck her.  Since that time, she has been having tingling in her arm and today she states it aches.  She is also reporting that her left shoulder started hurting this morning.

## 2018-01-08 NOTE — Discharge Instructions (Addendum)
Symptoms most likely inflammatory in nature.  Continue conservative management of rest, ice, heat, and gentle stretches We will trial a short course of naproxen an anti-inflammatory (please be aware this may cause abdominal discomfort, ulcers, and GI bleeds avoid taking with other NSAIDs) Follow up with PCP for recheck if symptoms persist Return or go to the ER if you have any new or worsening symptoms (fever, chills, chest pain, nausea, dizziness, abdominal pain, changes in bowel or bladder habits, pain radiating into lower legs, etc...)

## 2018-01-08 NOTE — ED Provider Notes (Signed)
[facility]   [id number] 01/08/18 Arrival Time: 1106  CC: Left arm pain  SUBJECTIVE: History from: patient. Vanessa Ward is a 72 y.o. female hx significant for CKD, GERD, HTN, HLD, complains of left arm pain that began 4 days ago.  Symptoms began after having blood work done.  States she felt a pop in her left wrist. Patient also admits to repetitive motions with crocheting.  Localizes the pain to the inside of her left forearm.  Describes the pain as intermittent and "soreness and tingling" in character.  Has tried warm compresses with temporary relief.  Symptoms reproduced with elbow flexion and extension.  Complains of similar numbness and tingling in right hand with carpal tunnel.  Denies fever, chills, erythema, ecchymosis, effusion, weakness, chest pain, SOB, fatigue, nausea, vomiting, lightheadedness, dizziness, or numbness.      ROS: As per HPI.  Past Medical History:  Diagnosis Date  . Arthritis   . Chronic kidney disease    resent diagnosis of hematuria due to benigh kidney tumor  . GERD (gastroesophageal reflux disease)   . Hypertension   . Shingles    Past Surgical History:  Procedure Laterality Date  . BIOPSY  10/15/2011   Procedure: BIOPSY;  Surgeon: Lahoma Crocker, MD;  Location: [facility];  Service: Gynecology;;  Cervical  . CERVICAL CONIZATION W/BX  12/17/2011   Procedure: CONIZATION CERVIX WITH BIOPSY;  Surgeon: Lahoma Crocker, MD;  Location: [facility];  Service: Gynecology;  Laterality: N/A;  . COLPOSCOPY  10/15/2011   Procedure: COLPOSCOPY;  Surgeon: Lahoma Crocker, MD;  Location: [facility];  Service: Gynecology;  Laterality: N/A;  . DIAGNOSTIC LAPAROSCOPY    . TUBAL LIGATION     Allergies  Allergen Reactions  . Other Rash    Perfume, scented lotion   No current facility-administered medications on file prior to encounter.    Current Outpatient Medications on File Prior to Encounter  Medication Sig Dispense Refill  .  lisinopril-hydrochlorothiazide (PRINZIDE,ZESTORETIC) 20-25 MG tablet Take 1 tablet by mouth daily. 90 tablet 2  . OMEPRAZOLE PO Take 20 mg by mouth daily.    . rosuvastatin (CRESTOR) 10 MG tablet Take 10 mg by mouth daily.     Social History   Socioeconomic History  . Marital status: Single    Spouse name: Not on file  . Number of children: Not on file  . Years of education: Not on file  . Highest education level: Not on file  Occupational History  . Not on file  Social Needs  . Financial resource strain: Not hard at all  . Food insecurity:    Worry: Never true    Inability: Never true  . Transportation needs:    Medical: No    Non-medical: No  Tobacco Use  . Smoking status: Former Smoker    Years: 35.00  . Smokeless tobacco: Never Used  Substance and Sexual Activity  . Alcohol use: Yes    Alcohol/week: 0.0 standard drinks    Comment: rarely  . Drug use: No  . Sexual activity: Not Currently    Birth control/protection: Post-menopausal  Lifestyle  . Physical activity:    Days per week: 0 days    Minutes per session: 0 min  . Stress: Not at all  Relationships  . Social connections:    Talks on phone: Not on file    Gets together: Not on file    Attends religious service: Not on file    Active member of club or organization:  Not on file    Attends meetings of clubs or organizations: Not on file    Relationship status: Not on file  . Intimate partner violence:    Fear of current or ex partner: No    Emotionally abused: No    Physically abused: No    Forced sexual activity: No  Other Topics Concern  . Not on file  Social History Narrative  . Not on file   Family History  Problem Relation Age of Onset  . Hypertension Mother   . Heart disease Mother   . Hypertension Father   . Diabetes Paternal Grandmother     OBJECTIVE:  Vitals:   01/08/18 1205  BP: 133/76  Pulse: 73  Resp: 18  Temp: 98.1 F (36.7 C)  TempSrc: Oral  SpO2: 96%    General  appearance: AOx3; in no acute distress.  Head: NCAT Lungs: CTA bilaterally Heart: RRR.  Clear S1 and S2 without murmur, gallops, or rubs.  Radial pulses 2+ bilaterally. Musculoskeletal: Left arm Inspection: Skin warm, dry, clear and intact without obvious erythema, effusion, or ecchymosis.  Palpation: Localizes pain to medial aspect of forearm in full elbow flexion.  Able to reproduce symptoms with full elbow flexion, and mild symptoms with phalen's test ROM: FROM active and passive about shoulder, wrist, and elbow Strength: 5/5 shld abduction, 5/5 shld adduction, 5/5 elbow flexion, 5/5 elbow extension, 5/5 grip strength Skin: warm and dry Neurologic: Ambulates without difficulty; Sensation intact about the upper extremities Psychological: alert and cooperative; normal mood and affect  ASSESSMENT & PLAN:  1. Tendinitis of left forearm    Discussed patient case with Dr. Joseph Art.  Not concerned for cardiac cause.  Symptoms most likely inflammatory.  Will trial short course of naproxen and have her follow up with PCP if symptoms persists.  Strict return and ED precautions if symptoms worsen or she experiences chest pain, SOB, dizziness, lightheadedness, nausea, vomiting, constant numbness/ tingling in forearm, etc...  Meds ordered this encounter  Medications  . naproxen (NAPROSYN) 375 MG tablet    Sig: Take 1 tablet (375 mg total) by mouth 2 (two) times daily as needed for mild pain.    Dispense:  10 tablet    Refill:  0    Order Specific Question:   Supervising Provider    Answer:   Wynona Luna [[id number]]   Symptoms most likely inflammatory in nature.  Continue conservative management of rest, ice, heat, and gentle stretches Take naproxen as needed for pain relief (may cause abdominal discomfort, ulcers, and GI bleeds avoid taking with other NSAIDs) Follow up with PCP if symptoms persist Return or go to the ER if you have any new or worsening symptoms (fever, chills, chest  pain, nausea, dizziness, abdominal pain, changes in bowel or bladder habits, pain radiating into lower legs, etc...)   Reviewed expectations re: course of current medical issues. Questions answered. Outlined signs and symptoms indicating need for more acute intervention. Patient verbalized understanding. After Visit Summary given.    Lestine Box, PA-C 01/08/18 1308

## 2018-01-10 ENCOUNTER — Other Ambulatory Visit: Payer: Self-pay

## 2018-01-18 ENCOUNTER — Ambulatory Visit (INDEPENDENT_AMBULATORY_CARE_PROVIDER_SITE_OTHER): Payer: Medicare Other | Admitting: Internal Medicine

## 2018-01-18 ENCOUNTER — Encounter: Payer: Self-pay | Admitting: Internal Medicine

## 2018-01-18 VITALS — BP 116/78 | HR 87 | Temp 98.1°F | Ht 63.5 in | Wt 240.6 lb

## 2018-01-18 DIAGNOSIS — M778 Other enthesopathies, not elsewhere classified: Secondary | ICD-10-CM

## 2018-01-18 DIAGNOSIS — Z09 Encounter for follow-up examination after completed treatment for conditions other than malignant neoplasm: Secondary | ICD-10-CM | POA: Diagnosis not present

## 2018-01-18 DIAGNOSIS — Z6841 Body Mass Index (BMI) 40.0 and over, adult: Secondary | ICD-10-CM | POA: Diagnosis not present

## 2018-01-18 DIAGNOSIS — M779 Enthesopathy, unspecified: Secondary | ICD-10-CM

## 2018-01-18 DIAGNOSIS — E66812 Obesity, class 2: Secondary | ICD-10-CM | POA: Insufficient documentation

## 2018-01-18 NOTE — Progress Notes (Signed)
Subjective:     Patient ID: Vanessa Ward , female    DOB: Jun 22, 1945 , 72 y.o.   MRN: [id number]   Chief Complaint  Patient presents with  . urgent care f/u    HPI  She is here today for f/u recent urgent care visit. She was seen there on 11/24 for evaluation of left arm pain.  She presented stating she felt a pop in her left wrist. Patient also admits to repetitive motions with crocheting.  Localizes the pain to the inside of her left forearm.  Describes the pain as intermittent and "soreness and tingling" in character.  Had tried warm compresses with temporary relief.  Symptoms reproduced with elbow flexion and extension. She was prescribed meds, but did not take them. Her sx have since improved.     Past Medical History:  Diagnosis Date  . Arthritis   . Chronic kidney disease    resent diagnosis of hematuria due to benigh kidney tumor  . GERD (gastroesophageal reflux disease)   . Hypertension   . Shingles      Family History  Problem Relation Age of Onset  . Hypertension Mother   . Heart disease Mother   . Hypertension Father   . Diabetes Paternal Grandmother      Current Outpatient Medications:  .  aspirin EC 81 MG tablet, Take 81 mg by mouth daily., Disp: , Rfl:  .  lisinopril-hydrochlorothiazide (PRINZIDE,ZESTORETIC) 20-25 MG tablet, Take 1 tablet by mouth daily., Disp: 90 tablet, Rfl: 2 .  OMEPRAZOLE PO, Take 20 mg by mouth daily., Disp: , Rfl:  .  rosuvastatin (CRESTOR) 10 MG tablet, Take 10 mg by mouth daily., Disp: , Rfl:  .  Vitamin D, Ergocalciferol, (DRISDOL) 1.25 MG (50000 UT) CAPS capsule, Take 50,000 Units by mouth every 7 (seven) days., Disp: , Rfl:  .  naproxen (NAPROSYN) 375 MG tablet, Take 1 tablet (375 mg total) by mouth 2 (two) times daily as needed for mild pain. (Patient not taking: Reported on 01/18/2018), Disp: 10 tablet, Rfl: 0   Allergies  Allergen Reactions  . Other Rash    Perfume, scented lotion     Review of Systems  Constitutional:  Negative.   Respiratory: Negative.   Cardiovascular: Negative.   Gastrointestinal: Negative.   Neurological: Negative.   Psychiatric/Behavioral: Negative.      Today's Vitals   01/18/18 1033  BP: 116/78  Pulse: 87  Temp: 98.1 F (36.7 C)  TempSrc: Oral  Weight: 240 lb 9.6 oz (109.1 kg)  Height: 5' 3.5" (1.613 m)  PainSc: 5   PainLoc: Knee   Body mass index is 41.95 kg/m.   Objective:  Physical Exam  Constitutional: She appears well-developed and well-nourished.  Eyes: EOM are normal.  Cardiovascular: Normal rate, regular rhythm and normal heart sounds.  Pulmonary/Chest: Effort normal and breath sounds normal.  Musculoskeletal:  Left forearm is tender to deep palpation. She has full ROM of left wrist. NO overlying erythema.   Psychiatric: She has a normal mood and affect.  Nursing note and vitals reviewed.       Assessment And Plan:     1. Tendinitis of left forearm  Resolving. Advised to apply muscle rub to area after crocheting.    3. Class 3 severe obesity due to excess calories with serious comorbidity and body mass index (BMI) of 40.0 to 44.9 in adult [facility])  She is excited to hear she did not gain any weight over Thanksgiving. She is encouraged to strive  to lose ten pounds prior to her next visit in Feb 2020.  [name], MD

## 2018-01-19 DIAGNOSIS — K219 Gastro-esophageal reflux disease without esophagitis: Secondary | ICD-10-CM | POA: Diagnosis not present

## 2018-01-19 DIAGNOSIS — Z1211 Encounter for screening for malignant neoplasm of colon: Secondary | ICD-10-CM | POA: Diagnosis not present

## 2018-02-01 DIAGNOSIS — Z1212 Encounter for screening for malignant neoplasm of rectum: Secondary | ICD-10-CM | POA: Diagnosis not present

## 2018-02-01 DIAGNOSIS — Z1211 Encounter for screening for malignant neoplasm of colon: Secondary | ICD-10-CM | POA: Diagnosis not present

## 2018-02-04 LAB — COLOGUARD

## 2018-02-10 ENCOUNTER — Encounter: Payer: Self-pay | Admitting: Gastroenterology

## 2018-02-21 DIAGNOSIS — J342 Deviated nasal septum: Secondary | ICD-10-CM | POA: Diagnosis not present

## 2018-02-21 DIAGNOSIS — J310 Chronic rhinitis: Secondary | ICD-10-CM | POA: Diagnosis not present

## 2018-02-21 DIAGNOSIS — H6123 Impacted cerumen, bilateral: Secondary | ICD-10-CM | POA: Diagnosis not present

## 2018-02-21 DIAGNOSIS — J343 Hypertrophy of nasal turbinates: Secondary | ICD-10-CM | POA: Diagnosis not present

## 2018-02-23 ENCOUNTER — Other Ambulatory Visit: Payer: Self-pay | Admitting: Internal Medicine

## 2018-04-06 ENCOUNTER — Encounter: Payer: Self-pay | Admitting: Internal Medicine

## 2018-04-06 ENCOUNTER — Ambulatory Visit (INDEPENDENT_AMBULATORY_CARE_PROVIDER_SITE_OTHER): Payer: Medicare Other | Admitting: Internal Medicine

## 2018-04-06 VITALS — BP 120/80 | HR 70 | Temp 98.5°F | Ht 64.6 in | Wt 238.8 lb

## 2018-04-06 DIAGNOSIS — K219 Gastro-esophageal reflux disease without esophagitis: Secondary | ICD-10-CM | POA: Diagnosis not present

## 2018-04-06 DIAGNOSIS — Z87891 Personal history of nicotine dependence: Secondary | ICD-10-CM | POA: Diagnosis not present

## 2018-04-06 DIAGNOSIS — Z6841 Body Mass Index (BMI) 40.0 and over, adult: Secondary | ICD-10-CM

## 2018-04-06 DIAGNOSIS — F17211 Nicotine dependence, cigarettes, in remission: Secondary | ICD-10-CM

## 2018-04-06 DIAGNOSIS — I10 Essential (primary) hypertension: Secondary | ICD-10-CM

## 2018-04-06 MED ORDER — OMEPRAZOLE 40 MG PO CPDR: 40.0000 mg | DELAYED_RELEASE_CAPSULE | Freq: Every day | ORAL | 0 refills | Status: DC

## 2018-04-08 NOTE — Progress Notes (Signed)
Subjective:     Patient ID: Vanessa Ward , female    DOB: 08/29/1945 , 73 y.o.   MRN: [id number]   Chief Complaint  Patient presents with  . Hypertension    HPI  Hypertension  This is a chronic problem. The current episode started more than 1 year ago. The problem has been gradually improving since onset. The problem is controlled. Pertinent negatives include no blurred vision, chest pain, palpitations or shortness of breath. Compliance problems include exercise.      Past Medical History:  Diagnosis Date  . Arthritis   . Chronic kidney disease    resent diagnosis of hematuria due to benigh kidney tumor  . GERD (gastroesophageal reflux disease)   . Hypertension   . Shingles      Family History  Problem Relation Age of Onset  . Hypertension Mother   . Heart disease Mother   . Hypertension Father   . Diabetes Paternal Grandmother      Current Outpatient Medications:  .  aspirin EC 81 MG tablet, Take 81 mg by mouth daily., Disp: , Rfl:  .  lisinopril-hydrochlorothiazide (PRINZIDE,ZESTORETIC) 20-25 MG tablet, Take 1 tablet by mouth daily., Disp: 90 tablet, Rfl: 2 .  rosuvastatin (CRESTOR) 10 MG tablet, TAKE 1 TABLET BY MOUTH ONCE DAILY, Disp: 90 tablet, Rfl: 0 .  Vitamin D, Ergocalciferol, (DRISDOL) 1.25 MG (50000 UT) CAPS capsule, Take 50,000 Units by mouth every 7 (seven) days., Disp: , Rfl:  .  omeprazole (PRILOSEC) 40 MG capsule, Take 1 capsule (40 mg total) by mouth daily., Disp: 90 capsule, Rfl: 0   Allergies  Allergen Reactions  . Other Rash    Perfume, scented lotion     Review of Systems  Constitutional: Negative.   Eyes: Negative for blurred vision.  Respiratory: Negative.  Negative for shortness of breath.   Cardiovascular: Negative.  Negative for chest pain and palpitations.  Gastrointestinal: Negative.        She c/o heartburn - intermittent chest discomfort, feels like food stuck in chest, belches  Neurological: Negative.    Psychiatric/Behavioral: Negative.      Today's Vitals   04/06/18 1122  BP: 120/80  Pulse: 70  Temp: 98.5 F (36.9 C)  TempSrc: Oral  Weight: 238 lb 12.8 oz (108.3 kg)  Height: 5' 4.6" (1.641 m)  PainSc: 0-No pain   Body mass index is 40.23 kg/m.   Objective:  Physical Exam Vitals signs and nursing note reviewed.  Constitutional:      Appearance: Normal appearance.  HENT:     Head: Normocephalic and atraumatic.  Cardiovascular:     Rate and Rhythm: Normal rate and regular rhythm.     Heart sounds: Normal heart sounds.  Pulmonary:     Effort: Pulmonary effort is normal.     Breath sounds: Normal breath sounds.  Skin:    General: Skin is warm.  Neurological:     General: No focal deficit present.     Mental Status: She is alert.  Psychiatric:        Mood and Affect: Mood normal.        Behavior: Behavior normal.         Assessment And Plan:     1. Essential hypertension, benign  Well controlled. She will continue with current meds. She is encouraged to aim for 150-250 minutes of exercise per week.   2. Gastroesophageal reflux disease without esophagitis  I will send rx omeprazole 40mg  daily. She will rto in six  weeks for re-evaluation. She is encouraged to contact me (or go to ER)immediately should her symptoms persist/worsen.   3. Cigarette nicotine dependence in remission  She admits to smoking one ppd for 30 years or more. She stopped less than 15 years ago. She agrees to move forward with low dose CT scan to screen for early stage lung cancer.   4. Class 3 severe obesity due to excess calories with serious comorbidity and body mass index (BMI) of 40.0 to 44.9 in adult [facility])  Importance of achieving optimal weight to decrease risk of cardiovascular disease and cancers was discussed with the patient in full detail. She is encouraged to start slowly - start with 10 minutes twice daily at least three to four days per week and to gradually build to 30 minutes  five days weekly. She was given tips to incorporate more activity into her daily routine - take stairs when possible, park farther away from her job, grocery stores, etc.    [name], MD

## 2018-04-26 ENCOUNTER — Ambulatory Visit
Admission: RE | Admit: 2018-04-26 | Discharge: 2018-04-26 | Disposition: A | Discharge status: Home / Self Care | Payer: Medicare Other | Source: Ambulatory Visit | Attending: Internal Medicine | Admitting: Internal Medicine

## 2018-04-26 DIAGNOSIS — Z87891 Personal history of nicotine dependence: Secondary | ICD-10-CM | POA: Diagnosis not present

## 2018-04-26 DIAGNOSIS — F17211 Nicotine dependence, cigarettes, in remission: Secondary | ICD-10-CM

## 2018-05-29 ENCOUNTER — Other Ambulatory Visit: Payer: Self-pay | Admitting: Internal Medicine

## 2018-07-17 ENCOUNTER — Other Ambulatory Visit: Payer: Self-pay | Admitting: Internal Medicine

## 2018-07-17 DIAGNOSIS — K219 Gastro-esophageal reflux disease without esophagitis: Secondary | ICD-10-CM

## 2018-08-02 ENCOUNTER — Encounter: Payer: Self-pay | Admitting: Internal Medicine

## 2018-08-02 ENCOUNTER — Ambulatory Visit: Payer: Medicare Other | Admitting: Internal Medicine

## 2018-08-02 ENCOUNTER — Ambulatory Visit (INDEPENDENT_AMBULATORY_CARE_PROVIDER_SITE_OTHER): Payer: Medicare Other | Admitting: Internal Medicine

## 2018-08-02 ENCOUNTER — Other Ambulatory Visit: Payer: Self-pay

## 2018-08-02 VITALS — BP 114/72 | HR 72 | Temp 98.9°F | Ht 64.6 in | Wt 233.2 lb

## 2018-08-02 DIAGNOSIS — Z79899 Other long term (current) drug therapy: Secondary | ICD-10-CM | POA: Diagnosis not present

## 2018-08-02 DIAGNOSIS — I129 Hypertensive chronic kidney disease with stage 1 through stage 4 chronic kidney disease, or unspecified chronic kidney disease: Secondary | ICD-10-CM | POA: Diagnosis not present

## 2018-08-02 DIAGNOSIS — E66812 Obesity, class 2: Secondary | ICD-10-CM

## 2018-08-02 DIAGNOSIS — E7800 Pure hypercholesterolemia, unspecified: Secondary | ICD-10-CM

## 2018-08-02 DIAGNOSIS — N182 Chronic kidney disease, stage 2 (mild): Secondary | ICD-10-CM

## 2018-08-02 DIAGNOSIS — J302 Other seasonal allergic rhinitis: Secondary | ICD-10-CM | POA: Diagnosis not present

## 2018-08-02 DIAGNOSIS — Z6839 Body mass index (BMI) 39.0-39.9, adult: Secondary | ICD-10-CM | POA: Diagnosis not present

## 2018-08-02 MED ORDER — TRIAMCINOLONE ACETONIDE 55 MCG/ACT NA AERO: 2.0000 | INHALATION_SPRAY | Freq: Every day | NASAL | 12 refills | Status: DC

## 2018-08-02 NOTE — Patient Instructions (Addendum)
Exercising to Stay Healthy To become healthy and stay healthy, it is recommended that you do moderate-intensity and vigorous-intensity exercise. You can tell that you are exercising at a moderate intensity if your heart starts beating faster and you start breathing faster but can still hold a conversation. You can tell that you are exercising at a vigorous intensity if you are breathing much harder and faster and cannot hold a conversation while exercising. Exercising regularly is important. It has many health benefits, such as:  Improving overall fitness, flexibility, and endurance.  Increasing bone density.  Helping with weight control.  Decreasing body fat.  Increasing muscle strength.  Reducing stress and tension.  Improving overall health. How often should I exercise? Choose an activity that you enjoy, and set realistic goals. Your health care provider can help you make an activity plan that works for you. Exercise regularly as told by your health care provider. This may include:  Doing strength training two times a week, such as: ? Lifting weights. ? Using resistance bands. ? Push-ups. ? Sit-ups. ? Yoga.  Doing a certain intensity of exercise for a given amount of time. Choose from these options: ? A total of 150 minutes of moderate-intensity exercise every week. ? A total of 75 minutes of vigorous-intensity exercise every week. ? A mix of moderate-intensity and vigorous-intensity exercise every week. Children, pregnant women, people who have not exercised regularly, people who are overweight, and older adults may need to talk with a health care provider about what activities are safe to do. If you have a medical condition, be sure to talk with your health care provider before you start a new exercise program. What are some exercise ideas? Moderate-intensity exercise ideas include:  Walking 1 mile (1.6 km) in about 15 minutes.  Biking.  Hiking.  Golfing.  Dancing.   Water aerobics. Vigorous-intensity exercise ideas include:  Walking 4.5 miles (7.2 km) or more in about 1 hour.  Jogging or running 5 miles (8 km) in about 1 hour.  Biking 10 miles (16.1 km) or more in about 1 hour.  Lap swimming.  Roller-skating or in-line skating.  Cross-country skiing.  Vigorous competitive sports, such as football, basketball, and soccer.  Jumping rope.  Aerobic dancing. What are some everyday activities that can help me to get exercise?  Yard work, such as: ? Pushing a lawn mower. ? Raking and bagging leaves.  Washing your car.  Pushing a stroller.  Shoveling snow.  Gardening.  Washing windows or floors. How can I be more active in my day-to-day activities?  Use stairs instead of an elevator.  Take a walk during your lunch break.  If you drive, park your car farther away from your work or school.  If you take public transportation, get off one stop early and walk the rest of the way.  Stand up or walk around during all of your indoor phone calls.  Get up, stretch, and walk around every 30 minutes throughout the day.  Enjoy exercise with a friend. Support to continue exercising will help you keep a regular routine of activity. What guidelines can I follow while exercising?  Before you start a new exercise program, talk with your health care provider.  Do not exercise so much that you hurt yourself, feel dizzy, or get very short of breath.  Wear comfortable clothes and wear shoes with good support.  Drink plenty of water while you exercise to prevent dehydration or heat stroke.  Work out until your breathing   and your heartbeat get faster. Where to find more information  U.[place]. Department of Health and Human Services: [employer]at  Centers for Disease Control and Prevention (CDC): http://www.wolf.info/ Summary  Exercising regularly is important. It will improve your overall fitness, flexibility, and endurance.  Regular exercise also will  improve your overall health. It can help you control your weight, reduce stress, and improve your bone density.  Do not exercise so much that you hurt yourself, feel dizzy, or get very short of breath.  Before you start a new exercise program, talk with your health care provider. This information is not intended to replace advice given to you by your health care provider. Make sure you discuss any questions you have with your health care provider. Document Released: 03/06/2010 Document Revised: 12/23/2016 Document Reviewed: 12/23/2016 Elsevier Interactive Patient Education  2019 [employer].  Allergies, Adult An allergy means that your body reacts to something that bothers it (allergen). It is not a normal reaction. This can happen from something that you:  Eat.  Breathe in.  Touch. You can have an allergy (be allergic) to:  Outdoor things, like: ? Pollen. ? Grass. ? Weeds.  Indoor things, like: ? Dust. ? Smoke. ? Pet dander.  Foods.  Medicines.  Things that bother your skin, like: ? Detergents. ? Chemicals. ? Latex.  Perfume.  Bugs. An allergy cannot spread from person to person (is not contagious). Follow these instructions at home:         Stay away from things that you know you are allergic to.  If you have allergies to things in the air, wash out your nose each day. Do it with one of these: ? A salt-water (saline) spray. ? A container (neti pot).  Take over-the-counter and prescription medicines only as told by your doctor.  Keep all follow-up visits as told by your doctor. This is important.  If you are at risk for a very bad allergy reaction (anaphylaxis), keep an auto-injector with you all the time. This is called an epinephrine injection. ? This is pre-measured medicine with a needle. You can put it into your skin by yourself. ? Right after you have a very bad allergy reaction, you or a person with you must give the medicine in less than a few  minutes. This is an emergency.  If you have ever had a very bad allergy reaction, wear a medical alert bracelet or necklace. Your very bad allergy should be written on it. Contact a health care provider if:  Your symptoms do not get better with treatment. Get help right away if:  You have symptoms of a very bad allergy reaction. These include: ? A swollen mouth, tongue, or throat. ? Pain or tightness in your chest. ? Trouble breathing. ? Being short of breath. ? Dizziness. ? Fainting. ? Very bad pain in your belly (abdomen). ? Throwing up (vomiting). ? Watery poop (diarrhea). Summary  An allergy means that your body reacts to something that bothers it (allergen). It is not a normal reaction.  Stay away from things that make your body react.  Take over-the-counter and prescription medicines only as told by your doctor.  If you are at risk for a very bad allergy reaction, carry an auto-injector (epinephrine injection) all the time. Also, wear a medical alert bracelet or necklace so people know about your allergy. This information is not intended to replace advice given to you by your health care provider. Make sure you discuss any questions you have  with your health care provider. Document Released: 05/29/2012 Document Revised: 05/17/2016 Document Reviewed: 05/17/2016 Elsevier Interactive Patient Education  2019 [employer].

## 2018-08-03 LAB — CMP14+EGFR
ALT: 12 IU/L (ref 0–32)
AST: 16 IU/L (ref 0–40)
Albumin/Globulin Ratio: 1.3 (ref 1.2–2.2)
Albumin: 4 g/dL (ref 3.7–4.7)
Alkaline Phosphatase: 56 IU/L (ref 39–117)
BUN/Creatinine Ratio: 20 (ref 12–28)
BUN: 20 mg/dL (ref 8–27)
Bilirubin Total: 0.6 mg/dL (ref 0.0–1.2)
CO2: 23 mmol/L (ref 20–29)
Calcium: 9.5 mg/dL (ref 8.7–10.3)
Chloride: 104 mmol/L (ref 96–106)
Creatinine, Ser: 1.02 mg/dL — ABNORMAL HIGH (ref 0.57–1.00)
GFR calc Af Amer: 64 mL/min/{1.73_m2} (ref >59)
GFR calc non Af Amer: 55 mL/min/{1.73_m2} — ABNORMAL LOW (ref >59)
Globulin, Total: 3 g/dL (ref 1.5–4.5)
Glucose: 97 mg/dL (ref 65–99)
Potassium: 3.6 mmol/L (ref 3.5–5.2)
Sodium: 143 mmol/L (ref 134–144)
Total Protein: 7 g/dL (ref 6.0–8.5)

## 2018-08-03 LAB — LIPID PANEL
Chol/HDL Ratio: 2.8 ratio (ref 0.0–4.4)
Cholesterol, Total: 152 mg/dL (ref 100–199)
HDL: 55 mg/dL (ref >39)
LDL Calculated: 73 mg/dL (ref 0–99)
Triglycerides: 118 mg/dL (ref 0–149)
VLDL Cholesterol Cal: 24 mg/dL (ref 5–40)

## 2018-08-05 NOTE — Progress Notes (Signed)
Subjective:     Patient ID: Vanessa Ward , female    DOB: 1945-09-01 , 73 y.o.   MRN: [id number]   Chief Complaint  Patient presents with  . Hyperlipidemia  . Hypertension    HPI  Hyperlipidemia This is a chronic problem. The problem is controlled. Recent lipid tests were reviewed and are normal. Exacerbating diseases include obesity. Pertinent negatives include no chest pain or shortness of breath. Current antihyperlipidemic treatment includes statins. The current treatment provides moderate improvement of lipids. Compliance problems include adherence to exercise.  Risk factors for coronary artery disease include dyslipidemia, hypertension, post-menopausal, a sedentary lifestyle and obesity.  Hypertension This is a chronic problem. The current episode started more than 1 year ago. The problem has been gradually improving since onset. Pertinent negatives include no blurred vision, chest pain, palpitations or shortness of breath. Past treatments include angiotensin blockers and diuretics.     Past Medical History:  Diagnosis Date  . Arthritis   . Chronic kidney disease    resent diagnosis of hematuria due to benigh kidney tumor  . GERD (gastroesophageal reflux disease)   . Hypertension   . Shingles      Family History  Problem Relation Age of Onset  . Hypertension Mother   . Heart disease Mother   . Hypertension Father   . Diabetes Paternal Grandmother      Current Outpatient Medications:  .  aspirin EC 81 MG tablet, Take 81 mg by mouth daily., Disp: , Rfl:  .  Cholecalciferol (DIALYVITE VITAMIN D 5000 PO), Take by mouth., Disp: , Rfl:  .  lisinopril-hydrochlorothiazide (PRINZIDE,ZESTORETIC) 20-25 MG tablet, Take 1 tablet by mouth daily., Disp: 90 tablet, Rfl: 2 .  omeprazole (PRILOSEC) 40 MG capsule, Take 1 capsule by mouth once daily, Disp: 90 capsule, Rfl: 0 .  rosuvastatin (CRESTOR) 10 MG tablet, Take 1 tablet by mouth once daily, Disp: 90 tablet, Rfl: 0 .   triamcinolone (NASACORT ALLERGY 24HR) 55 MCG/ACT AERO nasal inhaler, Place 2 sprays into the nose daily., Disp: 1 Inhaler, Rfl: 12   Allergies  Allergen Reactions  . Other Rash    Perfume, scented lotion     Review of Systems  Constitutional: Negative.   HENT: Positive for congestion.        She feels flonase is no longer effective. She would like to try another nasal spray.   Eyes: Negative for blurred vision.  Respiratory: Negative.  Negative for shortness of breath.   Cardiovascular: Negative.  Negative for chest pain and palpitations.  Gastrointestinal: Negative.   Neurological: Negative.   Psychiatric/Behavioral: Negative.      Today's Vitals   08/02/18 1033  BP: 114/72  Pulse: 72  Temp: 98.9 F (37.2 C)  TempSrc: Oral  Weight: 233 lb 3.2 oz (105.8 kg)  Height: 5' 4.6" (1.641 m)  PainSc: 5   PainLoc: Foot   Body mass index is 39.29 kg/m.   Objective:  Physical Exam Vitals signs and nursing note reviewed.  Constitutional:      Appearance: Normal appearance.  HENT:     Head: Normocephalic and atraumatic.  Cardiovascular:     Rate and Rhythm: Normal rate and regular rhythm.     Heart sounds: Normal heart sounds.  Pulmonary:     Effort: Pulmonary effort is normal.     Breath sounds: Normal breath sounds.  Skin:    General: Skin is warm.  Neurological:     General: No focal deficit present.  Mental Status: She is alert.  Psychiatric:        Mood and Affect: Mood normal.        Behavior: Behavior normal.         Assessment And Plan:     1. Pure hypercholesterolemia  I will check a fasting lipid panel. She is encouraged to avoid fried foods, increase daily exercise and to increase her fish intake.   - Lipid panel  2. Hypertensive nephropathy  Well controlled. She will continue with current meds. She is encouraged to avoid adding salt to her foods.  - CMP14+EGFR  3. Chronic renal disease, stage II  Chronic. She is encouraged to stay well  hydrated. Importance of maintaining optimal weight and bp control to decrease progression of disease was discussed with the patient.   4. Seasonal allergies  Chronic. She was given sample of Nasacort NS to use one to two sprays each nostril daily. She will let me know if this is more effective than Flonase she had been using previously.   5. Class 2 severe obesity due to excess calories with serious comorbidity and body mass index (BMI) of 39.0 to 39.9 in adult [facility])  Importance of achieving optimal weight to decrease risk of cardiovascular disease and cancers was discussed with the patient in full detail. She is encouraged to start slowly - start with 10 minutes twice daily at least three to four days per week and to gradually build to 30 minutes five days weekly. She was given tips to incorporate more activity into her daily routine - take stairs when possible, park farther away from grocery stores, etc.    [name], MD    THE PATIENT IS ENCOURAGED TO PRACTICE SOCIAL DISTANCING DUE TO THE COVID-19 PANDEMIC.

## 2018-08-09 ENCOUNTER — Other Ambulatory Visit: Payer: Self-pay

## 2018-08-09 LAB — SPECIMEN STATUS REPORT

## 2018-08-09 LAB — HEPATITIS C ANTIBODY: Hep C Virus Ab: <0.1 s/co ratio (ref 0.0–0.9)

## 2018-08-09 MED ORDER — MOMETASONE FUROATE 50 MCG/ACT NA SUSP: 2.0000 | Freq: Every day | NASAL | 1 refills | Status: DC

## 2018-08-15 ENCOUNTER — Other Ambulatory Visit: Payer: Self-pay | Admitting: Internal Medicine

## 2018-08-15 DIAGNOSIS — Z1231 Encounter for screening mammogram for malignant neoplasm of breast: Secondary | ICD-10-CM

## 2018-08-24 DIAGNOSIS — D3002 Benign neoplasm of left kidney: Secondary | ICD-10-CM | POA: Diagnosis not present

## 2018-09-06 ENCOUNTER — Other Ambulatory Visit: Payer: Self-pay | Admitting: Internal Medicine

## 2018-09-25 ENCOUNTER — Other Ambulatory Visit: Payer: Self-pay

## 2018-09-25 ENCOUNTER — Ambulatory Visit
Admission: RE | Admit: 2018-09-25 | Discharge: 2018-09-25 | Disposition: A | Discharge status: Home / Self Care | Payer: Medicare Other | Source: Ambulatory Visit | Attending: Internal Medicine | Admitting: Internal Medicine

## 2018-09-25 DIAGNOSIS — Z1231 Encounter for screening mammogram for malignant neoplasm of breast: Secondary | ICD-10-CM | POA: Diagnosis not present

## 2018-10-16 ENCOUNTER — Other Ambulatory Visit: Payer: Self-pay | Admitting: Internal Medicine

## 2018-10-16 DIAGNOSIS — K219 Gastro-esophageal reflux disease without esophagitis: Secondary | ICD-10-CM

## 2018-11-10 ENCOUNTER — Telehealth: Payer: Self-pay | Admitting: Internal Medicine

## 2018-11-10 NOTE — Chronic Care Management (AMB) (Signed)
Chronic Care Management   Note  11/10/2018 Name: Vanessa Ward MRN: [id number] DOB: [birth year]  Vanessa Ward is a 73 y.o. year old female who is a primary care patient of Glendale Chard, MD. I reached out to [name] by phone today in response to a referral sent by Ms. Nanci Lakatos Ehrman's patient's health plan.     [name] was given information about Chronic Care Management services today including:  1. CCM service includes personalized support from designated clinical staff supervised by her physician, including individualized plan of care and coordination with other care providers 2. 24/7 contact phone numbers for assistance for urgent and routine care needs. 3. Service will only be billed when office clinical staff spend 20 minutes or more in a month to coordinate care. 4. Only one practitioner may furnish and bill the service in a calendar month. 5. The patient may stop CCM services at any time (effective at the end of the month) by phone call to the office staff. 6. The patient will be responsible for cost sharing (co-pay) of up to 20% of the service fee (after annual deductible is met).  Patient agreed to services and verbal consent obtained.   Follow up plan: Telephone appointment with CCM team member scheduled for:12/14/2018  [place]  ??[email or web address]   ??[phone]

## 2018-12-05 ENCOUNTER — Other Ambulatory Visit: Payer: Self-pay | Admitting: Internal Medicine

## 2018-12-14 ENCOUNTER — Telehealth: Payer: Medicare Other

## 2018-12-27 ENCOUNTER — Other Ambulatory Visit: Payer: Self-pay

## 2018-12-27 ENCOUNTER — Ambulatory Visit (INDEPENDENT_AMBULATORY_CARE_PROVIDER_SITE_OTHER): Payer: Medicare Other

## 2018-12-27 ENCOUNTER — Encounter: Payer: Self-pay | Admitting: Internal Medicine

## 2018-12-27 ENCOUNTER — Ambulatory Visit (INDEPENDENT_AMBULATORY_CARE_PROVIDER_SITE_OTHER): Payer: Medicare Other | Admitting: Internal Medicine

## 2018-12-27 VITALS — BP 124/78 | HR 75 | Temp 98.7°F | Ht 63.6 in | Wt 231.4 lb

## 2018-12-27 DIAGNOSIS — I10 Essential (primary) hypertension: Secondary | ICD-10-CM | POA: Diagnosis not present

## 2018-12-27 DIAGNOSIS — M7662 Achilles tendinitis, left leg: Secondary | ICD-10-CM

## 2018-12-27 DIAGNOSIS — R7309 Other abnormal glucose: Secondary | ICD-10-CM

## 2018-12-27 DIAGNOSIS — Z0000 Encounter for general adult medical examination without abnormal findings: Secondary | ICD-10-CM | POA: Diagnosis not present

## 2018-12-27 DIAGNOSIS — I129 Hypertensive chronic kidney disease with stage 1 through stage 4 chronic kidney disease, or unspecified chronic kidney disease: Secondary | ICD-10-CM | POA: Diagnosis not present

## 2018-12-27 DIAGNOSIS — Z6841 Body Mass Index (BMI) 40.0 and over, adult: Secondary | ICD-10-CM | POA: Diagnosis not present

## 2018-12-27 DIAGNOSIS — E7800 Pure hypercholesterolemia, unspecified: Secondary | ICD-10-CM | POA: Diagnosis not present

## 2018-12-27 DIAGNOSIS — N182 Chronic kidney disease, stage 2 (mild): Secondary | ICD-10-CM

## 2018-12-27 DIAGNOSIS — Z23 Encounter for immunization: Secondary | ICD-10-CM | POA: Diagnosis not present

## 2018-12-27 LAB — POCT URINALYSIS DIPSTICK
Bilirubin, UA: NEGATIVE
Glucose, UA: NEGATIVE
Ketones, UA: NEGATIVE
Nitrite, UA: NEGATIVE
Protein, UA: NEGATIVE
Spec Grav, UA: 1.025 (ref 1.010–1.025)
Urobilinogen, UA: 0.2 E.U./dL
pH, UA: 5.5 (ref 5.0–8.0)

## 2018-12-27 LAB — POCT UA - MICROALBUMIN
Albumin/Creatinine Ratio, Urine, POC: <30
Creatinine, POC: 300 mg/dL
Microalbumin Ur, POC: 10 mg/L

## 2018-12-27 NOTE — Patient Instructions (Signed)
Vanessa Ward , Thank you for taking time to come for your Medicare Wellness Visit. I appreciate your ongoing commitment to your health goals. Please review the following plan we discussed and let me know if I can assist you in the future.   Screening recommendations/referrals: Colonoscopy: cologuard 02/2018 Mammogram: 09/2018 Bone Density: 11/2016 Recommended yearly ophthalmology/optometry visit for glaucoma screening and checkup Recommended yearly dental visit for hygiene and checkup  Vaccinations: Influenza vaccine: today Pneumococcal vaccine: 06/2013 Tdap vaccine: 05/2012 Shingles vaccine: discussed    Advanced directives: Advance directive discussed with you today. Even though you declined this today please call our office should you change your mind and we can give you the proper paperwork for you to fill out.   Conditions/risks identified: obesity  Next appointment: 07/09/2019 at 10:30   Preventive Care 65 Years and Older, Female Preventive care refers to lifestyle choices and visits with your health care provider that can promote health and wellness. What does preventive care include?  A yearly physical exam. This is also called an annual well check.  Dental exams once or twice a year.  Routine eye exams. Ask your health care provider how often you should have your eyes checked.  Personal lifestyle choices, including:  Daily care of your teeth and gums.  Regular physical activity.  Eating a healthy diet.  Avoiding tobacco and drug use.  Limiting alcohol use.  Practicing safe sex.  Taking low-dose aspirin every day.  Taking vitamin and mineral supplements as recommended by your health care provider. What happens during an annual well check? The services and screenings done by your health care provider during your annual well check will depend on your age, overall health, lifestyle risk factors, and family history of disease. Counseling  Your health care provider  may ask you questions about your:  Alcohol use.  Tobacco use.  Drug use.  Emotional well-being.  Home and relationship well-being.  Sexual activity.  Eating habits.  History of falls.  Memory and ability to understand (cognition).  Work and work Statistician.  Reproductive health. Screening  You may have the following tests or measurements:  Height, weight, and BMI.  Blood pressure.  Lipid and cholesterol levels. These may be checked every 5 years, or more frequently if you are over 62 years old.  Skin check.  Lung cancer screening. You may have this screening every year starting at age 23 if you have a 30-pack-year history of smoking and currently smoke or have quit within the past 15 years.  Fecal occult blood test (FOBT) of the stool. You may have this test every year starting at age 63.  Flexible sigmoidoscopy or colonoscopy. You may have a sigmoidoscopy every 5 years or a colonoscopy every 10 years starting at age 44.  Hepatitis C blood test.  Hepatitis B blood test.  Sexually transmitted disease (STD) testing.  Diabetes screening. This is done by checking your blood sugar (glucose) after you have not eaten for a while (fasting). You may have this done every 1-3 years.  Bone density scan. This is done to screen for osteoporosis. You may have this done starting at age 70.  Mammogram. This may be done every 1-2 years. Talk to your health care provider about how often you should have regular mammograms. Talk with your health care provider about your test results, treatment options, and if necessary, the need for more tests. Vaccines  Your health care provider may recommend certain vaccines, such as:  Influenza vaccine. This is recommended  every year.  Tetanus, diphtheria, and acellular pertussis (Tdap, Td) vaccine. You may need a Td booster every 10 years.  Zoster vaccine. You may need this after age 43.  Pneumococcal 13-valent conjugate (PCV13) vaccine.  One dose is recommended after age 83.  Pneumococcal polysaccharide (PPSV23) vaccine. One dose is recommended after age 39. Talk to your health care provider about which screenings and vaccines you need and how often you need them. This information is not intended to replace advice given to you by your health care provider. Make sure you discuss any questions you have with your health care provider. Document Released: 02/28/2015 Document Revised: 10/22/2015 Document Reviewed: 12/03/2014 Elsevier Interactive Patient Education  2017 [employer] Prevention in the Home Falls can cause injuries. They can happen to people of all ages. There are many things you can do to make your home safe and to help prevent falls. What can I do on the outside of my home?  Regularly fix the edges of walkways and driveways and fix any cracks.  Remove anything that might make you trip as you walk through a door, such as a raised step or threshold.  Trim any bushes or trees on the path to your home.  Use bright outdoor lighting.  Clear any walking paths of anything that might make someone trip, such as rocks or tools.  Regularly check to see if handrails are loose or broken. Make sure that both sides of any steps have handrails.  Any raised decks and porches should have guardrails on the edges.  Have any leaves, snow, or ice cleared regularly.  Use sand or salt on walking paths during winter.  Clean up any spills in your garage right away. This includes oil or grease spills. What can I do in the bathroom?  Use night lights.  Install grab bars by the toilet and in the tub and shower. Do not use towel bars as grab bars.  Use non-skid mats or decals in the tub or shower.  If you need to sit down in the shower, use a plastic, non-slip stool.  Keep the floor dry. Clean up any water that spills on the floor as soon as it happens.  Remove soap buildup in the tub or shower regularly.  Attach bath  mats securely with double-sided non-slip rug tape.  Do not have throw rugs and other things on the floor that can make you trip. What can I do in the bedroom?  Use night lights.  Make sure that you have a light by your bed that is easy to reach.  Do not use any sheets or blankets that are too big for your bed. They should not hang down onto the floor.  Have a firm chair that has side arms. You can use this for support while you get dressed.  Do not have throw rugs and other things on the floor that can make you trip. What can I do in the kitchen?  Clean up any spills right away.  Avoid walking on wet floors.  Keep items that you use a lot in easy-to-reach places.  If you need to reach something above you, use a strong step stool that has a grab bar.  Keep electrical cords out of the way.  Do not use floor polish or wax that makes floors slippery. If you must use wax, use non-skid floor wax.  Do not have throw rugs and other things on the floor that can make you trip. What  can I do with my stairs?  Do not leave any items on the stairs.  Make sure that there are handrails on both sides of the stairs and use them. Fix handrails that are broken or loose. Make sure that handrails are as long as the stairways.  Check any carpeting to make sure that it is firmly attached to the stairs. Fix any carpet that is loose or worn.  Avoid having throw rugs at the top or bottom of the stairs. If you do have throw rugs, attach them to the floor with carpet tape.  Make sure that you have a light switch at the top of the stairs and the bottom of the stairs. If you do not have them, ask someone to add them for you. What else can I do to help prevent falls?  Wear shoes that:  Do not have high heels.  Have rubber bottoms.  Are comfortable and fit you well.  Are closed at the toe. Do not wear sandals.  If you use a stepladder:  Make sure that it is fully opened. Do not climb a closed  stepladder.  Make sure that both sides of the stepladder are locked into place.  Ask someone to hold it for you, if possible.  Clearly mark and make sure that you can see:  Any grab bars or handrails.  First and last steps.  Where the edge of each step is.  Use tools that help you move around (mobility aids) if they are needed. These include:  Canes.  Walkers.  Scooters.  Crutches.  Turn on the lights when you go into a dark area. Replace any light bulbs as soon as they burn out.  Set up your furniture so you have a clear path. Avoid moving your furniture around.  If any of your floors are uneven, fix them.  If there are any pets around you, be aware of where they are.  Review your medicines with your doctor. Some medicines can make you feel dizzy. This can increase your chance of falling. Ask your doctor what other things that you can do to help prevent falls. This information is not intended to replace advice given to you by your health care provider. Make sure you discuss any questions you have with your health care provider. Document Released: 11/28/2008 Document Revised: 07/10/2015 Document Reviewed: 03/08/2014 Elsevier Interactive Patient Education  2017 [employer].

## 2018-12-27 NOTE — Patient Instructions (Addendum)
Get Calm, magnesium supplement Take nightly, use as directed   Exercising to Lose Weight Exercise is structured, repetitive physical activity to improve fitness and health. Getting regular exercise is important for everyone. It is especially important if you are overweight. Being overweight increases your risk of heart disease, stroke, diabetes, high blood pressure, and several types of cancer. Reducing your calorie intake and exercising can help you lose weight. Exercise is usually categorized as moderate or vigorous intensity. To lose weight, most people need to do a certain amount of moderate-intensity or vigorous-intensity exercise each week. Moderate-intensity exercise  Moderate-intensity exercise is any activity that gets you moving enough to burn at least three times more energy (calories) than if you were sitting. Examples of moderate exercise include:  Walking a mile in 15 minutes.  Doing light yard work.  Biking at an easy pace. Most people should get at least 150 minutes (2 hours and 30 minutes) a week of moderate-intensity exercise to maintain their body weight. Vigorous-intensity exercise Vigorous-intensity exercise is any activity that gets you moving enough to burn at least six times more calories than if you were sitting. When you exercise at this intensity, you should be working hard enough that you are not able to carry on a conversation. Examples of vigorous exercise include:  Running.  Playing a team sport, such as football, basketball, and soccer.  Jumping rope. Most people should get at least 75 minutes (1 hour and 15 minutes) a week of vigorous-intensity exercise to maintain their body weight. How can exercise affect me? When you exercise enough to burn more calories than you eat, you lose weight. Exercise also reduces body fat and builds muscle. The more muscle you have, the more calories you burn. Exercise also:  Improves mood.  Reduces stress and tension.   Improves your overall fitness, flexibility, and endurance.  Increases bone strength. The amount of exercise you need to lose weight depends on:  Your age.  The type of exercise.  Any health conditions you have.  Your overall physical ability. Talk to your health care provider about how much exercise you need and what types of activities are safe for you. What actions can I take to lose weight? Nutrition   Make changes to your diet as told by your health care provider or diet and nutrition specialist (dietitian). This may include: ? Eating fewer calories. ? Eating more protein. ? Eating less unhealthy fats. ? Eating a diet that includes fresh fruits and vegetables, whole grains, low-fat dairy products, and lean protein. ? Avoiding foods with added fat, salt, and sugar.  Drink plenty of water while you exercise to prevent dehydration or heat stroke. Activity  Choose an activity that you enjoy and set realistic goals. Your health care provider can help you make an exercise plan that works for you.  Exercise at a moderate or vigorous intensity most days of the week. ? The intensity of exercise may vary from person to person. You can tell how intense a workout is for you by paying attention to your breathing and heartbeat. Most people will notice their breathing and heartbeat get faster with more intense exercise.  Do resistance training twice each week, such as: ? Push-ups. ? Sit-ups. ? Lifting weights. ? Using resistance bands.  Getting short amounts of exercise can be just as helpful as long structured periods of exercise. If you have trouble finding time to exercise, try to include exercise in your daily routine. ? Get up, stretch, and walk  around every 30 minutes throughout the day. ? Go for a walk during your lunch break. ? Park your car farther away from your destination. ? If you take public transportation, get off one stop early and walk the rest of the way. ? Make  phone calls while standing up and walking around. ? Take the stairs instead of elevators or escalators.  Wear comfortable clothes and shoes with good support.  Do not exercise so much that you hurt yourself, feel dizzy, or get very short of breath. Where to find more information  U.S. Department of Health and Human Services: [employer]  Centers for Disease Control and Prevention (CDC): http://www.wolf.info/ Contact a health care provider:  Before starting a new exercise program.  If you have questions or concerns about your weight.  If you have a medical problem that keeps you from exercising. Get help right away if you have any of the following while exercising:  Injury.  Dizziness.  Difficulty breathing or shortness of breath that does not go away when you stop exercising.  Chest pain.  Rapid heartbeat. Summary  Being overweight increases your risk of heart disease, stroke, diabetes, high blood pressure, and several types of cancer.  Losing weight happens when you burn more calories than you eat.  Reducing the amount of calories you eat in addition to getting regular moderate or vigorous exercise each week helps you lose weight. This information is not intended to replace advice given to you by your health care provider. Make sure you discuss any questions you have with your health care provider. Document Released: 03/06/2010 Document Revised: 02/14/2017 Document Reviewed: 02/14/2017 Elsevier Patient Education  2020 [employer].

## 2018-12-27 NOTE — Progress Notes (Signed)
Subjective:   Vanessa Ward is a 73 y.o. female who presents for Medicare Annual (Subsequent) preventive examination.  Review of Systems:  n/a Cardiac Risk Factors include: advanced age (>79men, >59 women);dyslipidemia;hypertension;obesity (BMI >30kg/m2)     Objective:     Vitals: BP 124/78 (BP Location: Left Arm, Patient Position: Sitting, Cuff Size: Large)   Pulse 75   Temp 98.7 F (37.1 C) (Oral)   Ht 5' 3.6" (1.615 m)   Wt 231 lb 6.4 oz (105 kg)   SpO2 97%   BMI 40.22 kg/m   Body mass index is 40.22 kg/m.  Advanced Directives 12/27/2018 12/21/2017 02/06/2014 12/13/2011 10/05/2011  Does Patient Have a Medical Advance Directive? No No No Patient does not have advance directive Patient does not have advance directive  Would patient like information on creating a medical advance directive? - Yes (MAU/Ambulatory/Procedural Areas - Information given) No - patient declined information - -    Tobacco Social History   Tobacco Use  Smoking Status Former Smoker  . Years: 35.00  . Types: Cigarettes  Smokeless Tobacco Never Used     Counseling given: Not Answered   Clinical Intake:  Pre-visit preparation completed: Yes  Pain : No/denies pain     Nutritional Status: BMI > 30  Obese Nutritional Risks: None Diabetes: No  How often do you need to have someone help you when you read instructions, pamphlets, or other written materials from your doctor or pharmacy?: 1 - Never What is the last grade level you completed in school?: 12th grade  Interpreter Needed?: No  Information entered by :: NAllen LPN  Past Medical History:  Diagnosis Date  . Arthritis   . Chronic kidney disease    resent diagnosis of hematuria due to benigh kidney tumor  . GERD (gastroesophageal reflux disease)   . Hypertension   . Shingles    Past Surgical History:  Procedure Laterality Date  . BIOPSY  10/15/2011   Procedure: BIOPSY;  Surgeon: Lahoma Crocker, MD;  Location: [facility];  Service: Gynecology;;  Cervical  . CERVICAL CONIZATION W/BX  12/17/2011   Procedure: CONIZATION CERVIX WITH BIOPSY;  Surgeon: Lahoma Crocker, MD;  Location: [facility];  Service: Gynecology;  Laterality: N/A;  . COLPOSCOPY  10/15/2011   Procedure: COLPOSCOPY;  Surgeon: Lahoma Crocker, MD;  Location: [facility];  Service: Gynecology;  Laterality: N/A;  . DIAGNOSTIC LAPAROSCOPY    . TUBAL LIGATION     Family History  Problem Relation Age of Onset  . Hypertension Mother   . Heart disease Mother   . Hypertension Father   . Diabetes Paternal Grandmother    Social History   Socioeconomic History  . Marital status: Single    Spouse name: Not on file  . Number of children: Not on file  . Years of education: Not on file  . Highest education level: Not on file  Occupational History  . Not on file  Social Needs  . Financial resource strain: Not hard at all  . Food insecurity    Worry: Never true    Inability: Never true  . Transportation needs    Medical: No    Non-medical: No  Tobacco Use  . Smoking status: Former Smoker    Years: 35.00    Types: Cigarettes  . Smokeless tobacco: Never Used  Substance and Sexual Activity  . Alcohol use: Yes    Alcohol/week: 2.0 standard drinks    Types: 2 Standard drinks or equivalent per week  .  Drug use: No  . Sexual activity: Not Currently    Birth control/protection: Post-menopausal  Lifestyle  . Physical activity    Days per week: 3 days    Minutes per session: 40 min  . Stress: Not at all  Relationships  . Social Herbalist on phone: Not on file    Gets together: Not on file    Attends religious service: Not on file    Active member of club or organization: Not on file    Attends meetings of clubs or organizations: Not on file    Relationship status: Not on file  Other Topics Concern  . Not on file  Social History Narrative  . Not on file    Outpatient Encounter Medications as of 12/27/2018  Medication Sig  .  aspirin EC 81 MG tablet Take 81 mg by mouth daily.  . Cholecalciferol (DIALYVITE VITAMIN D 5000 PO) Take by mouth.  Marland Kitchen lisinopril-hydrochlorothiazide (ZESTORETIC) 20-25 MG tablet Take 1 tablet by mouth once daily  . mometasone (NASONEX) 50 MCG/ACT nasal spray Place 2 sprays into the nose daily.  Marland Kitchen omeprazole (PRILOSEC) 40 MG capsule Take 1 capsule by mouth once daily  . rosuvastatin (CRESTOR) 10 MG tablet Take 1 tablet by mouth once daily   No facility-administered encounter medications on file as of 12/27/2018.     Activities of Daily Living In your present state of health, do you have any difficulty performing the following activities: 12/27/2018  Hearing? N  Vision? N  Difficulty concentrating or making decisions? N  Walking or climbing stairs? Y  Comment SOB once at the top  Dressing or bathing? N  Doing errands, shopping? N  Preparing Food and eating ? N  Using the Toilet? N  In the past six months, have you accidently leaked urine? N  Do you have problems with loss of bowel control? N  Managing your Medications? N  Managing your Finances? N  Housekeeping or managing your Housekeeping? N  Some recent data might be hidden    Patient Care Team: Glendale Chard, MD as PCP - General (Internal Medicine) Rex Kras, Claudette Stapler, RN as [place] Management    Assessment:   This is a routine wellness examination for Vanessa Ward.  Exercise Activities and Dietary recommendations Current Exercise Habits: Home exercise routine, Type of exercise: walking, Time (Minutes): 45, Frequency (Times/Week): 3, Weekly Exercise (Minutes/Week): 135  Goals    . Patient Stated     12/27/2018, no goals set at this time       Fall Risk Fall Risk  12/27/2018 08/02/2018 04/06/2018 01/02/2018 12/21/2017  Falls in the past year? 0 0 0 0 1  Comment - - - Emmi Telephone Survey: data to providers prior to load -  Number falls in past yr: 0 - - - 0  Injury with Fall? - - - - 0  Risk for fall  due to : Medication side effect - - - History of fall(s);Medication side effect  Follow up Falls evaluation completed;Education provided;Falls prevention discussed - - - -   Is the patient's home free of loose throw rugs in walkways, pet beds, electrical cords, etc?   yes      Grab bars in the bathroom? no      Handrails on the stairs?   no      Adequate lighting?   yes  Timed Get Up and Go performed: n/a  Depression Screen PHQ 2/9 Scores 12/27/2018 08/02/2018 04/06/2018 12/21/2017  PHQ - 2 Score 0 0 4 0  PHQ- 9 Score - - 5 -  Exception Documentation - - Other- indicate reason in comment box -  Not completed - - Patient stated her girlfriend of 40 years just passed away on 2022-06-09. -     Cognitive Function     6CIT Screen 12/27/2018 12/21/2017  What Year? 0 points 0 points  What month? 0 points 0 points  What time? 0 points 3 points  Count back from 20 0 points 0 points  Months in reverse 0 points 0 points  Repeat phrase 2 points 0 points  Total Score 2 3    Immunization History  Administered Date(s) Administered  . Influenza, High Dose Seasonal PF 12/21/2017, 12/27/2018    Qualifies for Shingles Vaccine? yes  Screening Tests Health Maintenance  Topic Date Due  . COLONOSCOPY  12/27/2019 (Originally 03/11/2018)  . MAMMOGRAM  09/24/2020  . TETANUS/TDAP  06/13/2022  . INFLUENZA VACCINE  Completed  . DEXA SCAN  Completed  . Hepatitis C Screening  Completed  . PNA vac Low Risk Adult  Completed    Cancer Screenings: Lung: Low Dose CT Chest recommended if Age 73-80 years, 30 pack-year currently smoking OR have quit w/in 15years. Patient does not qualify. Breast:  Up to date on Mammogram? Yes   Up to date of Bone Density/Dexa? Yes Colorectal: used cologuard 02/2018  Additional Screenings: : Hepatitis C Screening: 07/2018     Plan:    patient does not have any goals set at this time.   I have personally reviewed and noted the following in the patient's chart:   .  Medical and social history . Use of alcohol, tobacco or illicit drugs  . Current medications and supplements . Functional ability and status . Nutritional status . Physical activity . Advanced directives . List of other physicians . Hospitalizations, surgeries, and ER visits in previous 12 months . Vitals . Screenings to include cognitive, depression, and falls . Referrals and appointments  In addition, I have reviewed and discussed with patient certain preventive protocols, quality metrics, and best practice recommendations. A written personalized care plan for preventive services as well as general preventive health recommendations were provided to patient.     Kellie Simmering, LPN  [phone]

## 2018-12-27 NOTE — Progress Notes (Signed)
Subjective:     Patient ID: Vanessa Ward , female    DOB: 1945/11/06 , 73 y.o.   MRN: [id number]   Chief Complaint  Patient presents with  . Hypertension  . Hyperlipidemia    HPI  Hypertension This is a chronic problem. The current episode started more than 1 year ago. The problem has been gradually improving since onset. The problem is controlled. Pertinent negatives include no blurred vision, chest pain, palpitations or shortness of breath. Compliance problems include exercise.  Hypertensive end-organ damage includes kidney disease.  Hyperlipidemia This is a chronic problem. The problem is controlled. Exacerbating diseases include obesity. Pertinent negatives include no chest pain or shortness of breath. Current antihyperlipidemic treatment includes statins. The current treatment provides moderate improvement of lipids. Compliance problems include adherence to exercise.  Risk factors for coronary artery disease include hypertension, dyslipidemia, obesity, a sedentary lifestyle and post-menopausal.     Past Medical History:  Diagnosis Date  . Arthritis   . Chronic kidney disease    resent diagnosis of hematuria due to benigh kidney tumor  . GERD (gastroesophageal reflux disease)   . Hypertension   . Shingles      Family History  Problem Relation Age of Onset  . Hypertension Mother   . Heart disease Mother   . Hypertension Father   . Diabetes Paternal Grandmother      Current Outpatient Medications:  .  aspirin EC 81 MG tablet, Take 81 mg by mouth daily., Disp: , Rfl:  .  Cholecalciferol (DIALYVITE VITAMIN D 5000 PO), Take by mouth., Disp: , Rfl:  .  lisinopril-hydrochlorothiazide (ZESTORETIC) 20-25 MG tablet, Take 1 tablet by mouth once daily, Disp: 90 tablet, Rfl: 0 .  mometasone (NASONEX) 50 MCG/ACT nasal spray, Place 2 sprays into the nose daily., Disp: 17 g, Rfl: 1 .  omeprazole (PRILOSEC) 40 MG capsule, Take 1 capsule by mouth once daily, Disp: 90 capsule,  Rfl: 0 .  rosuvastatin (CRESTOR) 10 MG tablet, Take 1 tablet by mouth once daily, Disp: 90 tablet, Rfl: 0   Allergies  Allergen Reactions  . Other Rash    Perfume, scented lotion     Review of Systems  Constitutional: Negative.   Eyes: Negative for blurred vision.  Respiratory: Negative.  Negative for shortness of breath.   Cardiovascular: Negative.  Negative for chest pain and palpitations.  Gastrointestinal: Negative.   Musculoskeletal: Positive for arthralgias.       She c/o left heel pain. Occurs after being seated for long period of time. Denies fall/trauma. Described as dull,throbbing pain. It is intermittent. Unable to determine any other triggers. She has tried otc meds for relief, which have been mildly effective.   Neurological: Negative.   Psychiatric/Behavioral: Negative.      Today's Vitals   12/27/18 1043  BP: 124/78  Pulse: 75  Temp: 98.7 F (37.1 C)  TempSrc: Oral  Weight: 231 lb 6.4 oz (105 kg)  Height: 5' 3.6" (1.615 m)   Body mass index is 40.22 kg/m.   Objective:  Physical Exam Vitals signs and nursing note reviewed.  Constitutional:      Appearance: Normal appearance. She is obese.  HENT:     Head: Normocephalic and atraumatic.  Cardiovascular:     Rate and Rhythm: Normal rate and regular rhythm.     Heart sounds: Normal heart sounds.  Pulmonary:     Effort: Pulmonary effort is normal.     Breath sounds: Normal breath sounds.  Feet:  Comments: No left achilles tenderness.  Skin:    General: Skin is warm.  Neurological:     General: No focal deficit present.     Mental Status: She is alert.  Psychiatric:        Mood and Affect: Mood normal.        Behavior: Behavior normal.         Assessment And Plan:     1. Hypertensive nephropathy  Chronic, well controlled. She will continue with current meds. She is encouraged to avoid adding salt to her foods. She will rto in six months for her next physical exam. I will check renal function  today.  - BMP8+EGFR  2. Chronic renal disease, stage II  Chronic, yet stable. Importance of adequate hydration was discussed with the patient. Importance of optimal bp control to prevent progression of CKD was also discussed with the patient.   3. Pure hypercholesterolemia  Results from June 2020 were reviewed in full detail. LDL 73, pt advised this is at goal. She is encouraged to avoid fried foods, increase daily activity and to increase her fish intake.   4. Achilles tendinitis of left lower extremity  She was given some stretching exercises to perform daily. Also advised to apply topical pain cream to affected area twice daily as needed.  If persistent, I will send in rx meloxicam to take prn. If still persistent, I will refer her to Ortho/Podiatry for further evaluation.   5. Other abnormal glucose  HER A1C HAS BEEN ELEVATED IN THE PAST. I WILL CHECK AN A1C, BMET TODAY. SHE WAS ENCOURAGED TO AVOID SUGARY BEVERAGES AND PROCESSED FOODS INCLUDNG BREADS, RICE AND PASTA.  - Hemoglobin A1c  6. Class 3 severe obesity due to excess calories with serious comorbidity and body mass index (BMI) of 40.0 to 44.9 in adult [facility])  Importance of achieving optimal weight to decrease risk of cardiovascular disease and cancers was discussed with the patient in full detail. She is encouraged to start slowly - start with 10 minutes twice daily at least three to four days per week and to gradually build to 30 minutes five days weekly. She was given tips to incorporate more activity into her daily routine - take stairs when possible, park farther away from grocery stores, etc.    [name], MD    THE PATIENT IS ENCOURAGED TO PRACTICE SOCIAL DISTANCING DUE TO THE COVID-19 PANDEMIC.

## 2018-12-28 LAB — BMP8+EGFR
BUN/Creatinine Ratio: 23 (ref 12–28)
BUN: 28 mg/dL — ABNORMAL HIGH (ref 8–27)
CO2: 24 mmol/L (ref 20–29)
Calcium: 9.4 mg/dL (ref 8.7–10.3)
Chloride: 101 mmol/L (ref 96–106)
Creatinine, Ser: 1.22 mg/dL — ABNORMAL HIGH (ref 0.57–1.00)
GFR calc Af Amer: 51 mL/min/{1.73_m2} — ABNORMAL LOW (ref >59)
GFR calc non Af Amer: 44 mL/min/{1.73_m2} — ABNORMAL LOW (ref >59)
Glucose: 93 mg/dL (ref 65–99)
Potassium: 3.8 mmol/L (ref 3.5–5.2)
Sodium: 140 mmol/L (ref 134–144)

## 2018-12-28 LAB — HEMOGLOBIN A1C
Est. average glucose Bld gHb Est-mCnc: 117 mg/dL
Hgb A1c MFr Bld: 5.7 % — ABNORMAL HIGH (ref 4.8–5.6)

## 2019-01-14 ENCOUNTER — Other Ambulatory Visit: Payer: Self-pay | Admitting: Internal Medicine

## 2019-01-15 ENCOUNTER — Other Ambulatory Visit: Payer: Self-pay | Admitting: Internal Medicine

## 2019-01-15 DIAGNOSIS — K219 Gastro-esophageal reflux disease without esophagitis: Secondary | ICD-10-CM

## 2019-01-16 ENCOUNTER — Other Ambulatory Visit: Payer: Self-pay | Admitting: Internal Medicine

## 2019-01-17 ENCOUNTER — Telehealth: Payer: Self-pay

## 2019-01-17 ENCOUNTER — Ambulatory Visit: Payer: Self-pay

## 2019-01-17 DIAGNOSIS — N182 Chronic kidney disease, stage 2 (mild): Secondary | ICD-10-CM

## 2019-01-17 DIAGNOSIS — I129 Hypertensive chronic kidney disease with stage 1 through stage 4 chronic kidney disease, or unspecified chronic kidney disease: Secondary | ICD-10-CM

## 2019-01-17 DIAGNOSIS — Z6841 Body Mass Index (BMI) 40.0 and over, adult: Secondary | ICD-10-CM

## 2019-01-17 DIAGNOSIS — E7800 Pure hypercholesterolemia, unspecified: Secondary | ICD-10-CM

## 2019-01-17 NOTE — Chronic Care Management (AMB) (Signed)
  Chronic Care Management   Outreach Note  01/17/2019 Name: Vanessa Ward MRN: [id number] DOB: [birth year]  Referred by: Glendale Chard, MD Reason for referral : Chronic Care Management (INITIAL CCM RNCM Telephone Outreach )   An unsuccessful telephone outreach was attempted today. The patient was referred to the case management team by Glendale Chard MD for assistance with care management and care coordination.   Follow Up Plan: Telephone follow up appointment with care management team member scheduled for: 01/25/19  Barb Merino, RN, BSN, CCM Care Management Coordinator [place] [facility]  Direct Phone: [phone]

## 2019-01-25 ENCOUNTER — Telehealth: Payer: Self-pay

## 2019-01-25 ENCOUNTER — Ambulatory Visit (INDEPENDENT_AMBULATORY_CARE_PROVIDER_SITE_OTHER): Payer: Medicare Other

## 2019-01-25 DIAGNOSIS — I129 Hypertensive chronic kidney disease with stage 1 through stage 4 chronic kidney disease, or unspecified chronic kidney disease: Secondary | ICD-10-CM | POA: Diagnosis not present

## 2019-01-25 DIAGNOSIS — E7800 Pure hypercholesterolemia, unspecified: Secondary | ICD-10-CM

## 2019-01-25 DIAGNOSIS — N182 Chronic kidney disease, stage 2 (mild): Secondary | ICD-10-CM

## 2019-01-25 DIAGNOSIS — Z6841 Body Mass Index (BMI) 40.0 and over, adult: Secondary | ICD-10-CM

## 2019-01-25 NOTE — Chronic Care Management (AMB) (Signed)
  Chronic Care Management   Outreach Note  01/25/2019 Name: Vanessa Ward MRN: [id number] DOB: Jun 27, 1945  Referred by: Glendale Chard, MD Reason for referral : Chronic Care Management (#2 INITIAL CCM RNCM Telepohone Attempt )   A second unsuccessful telephone outreach was attempted today. The patient was referred to the case management team for assistance with care management and care coordination.   Follow Up Plan: A HIPPA compliant phone message was left for the patient providing contact information and requesting a return call.  Telephone follow up appointment with care management team member scheduled for: 03/13/19  Barb Merino, RN, BSN, CCM Care Management Coordinator [place] [facility]  Direct Phone: [phone]

## 2019-03-05 ENCOUNTER — Other Ambulatory Visit: Payer: Self-pay | Admitting: Internal Medicine

## 2019-03-13 ENCOUNTER — Telehealth: Payer: Self-pay

## 2019-04-12 ENCOUNTER — Ambulatory Visit: Payer: Medicare Other | Attending: Internal Medicine

## 2019-04-12 DIAGNOSIS — Z23 Encounter for immunization: Secondary | ICD-10-CM | POA: Insufficient documentation

## 2019-04-12 NOTE — Progress Notes (Signed)
   Covid-19 Vaccination Clinic  Name:  Vanessa Ward    MRN: [id number] DOB: Jun 18, 1945  04/12/2019  [name] was observed post Covid-19 immunization for 15 minutes without incidence. She was provided with Vaccine Information Sheet and instruction to access the V-Safe system.   [name] was instructed to call 911 with any severe reactions post vaccine: Marland Kitchen Difficulty breathing  . Swelling of your face and throat  . A fast heartbeat  . A bad rash all over your body  . Dizziness and weakness    Immunizations Administered    Name Date Dose VIS Date Route   Pfizer COVID-19 Vaccine 04/12/2019 11:50 AM 0.3 mL 01/26/2019 Intramuscular   Manufacturer: [place]   Lot: [id number]   [place]: [id number]

## 2019-04-16 ENCOUNTER — Other Ambulatory Visit: Payer: Self-pay | Admitting: Internal Medicine

## 2019-04-16 DIAGNOSIS — K219 Gastro-esophageal reflux disease without esophagitis: Secondary | ICD-10-CM

## 2019-04-23 ENCOUNTER — Other Ambulatory Visit: Payer: Self-pay

## 2019-04-23 ENCOUNTER — Telehealth: Payer: Medicare Other

## 2019-04-24 ENCOUNTER — Ambulatory Visit: Payer: Self-pay

## 2019-04-24 DIAGNOSIS — Z6841 Body Mass Index (BMI) 40.0 and over, adult: Secondary | ICD-10-CM

## 2019-04-24 DIAGNOSIS — I129 Hypertensive chronic kidney disease with stage 1 through stage 4 chronic kidney disease, or unspecified chronic kidney disease: Secondary | ICD-10-CM

## 2019-04-24 DIAGNOSIS — N182 Chronic kidney disease, stage 2 (mild): Secondary | ICD-10-CM

## 2019-04-24 DIAGNOSIS — E7800 Pure hypercholesterolemia, unspecified: Secondary | ICD-10-CM

## 2019-04-24 NOTE — Progress Notes (Signed)
This encounter was created in error - please disregard.

## 2019-04-25 NOTE — Chronic Care Management (AMB) (Signed)
  Chronic Care Management   Outreach Note  04/25/2019 Name: Vanessa Ward MRN: [id number] DOB: [birth year]  Referred by: Glendale Chard, MD Reason for referral : Chronic Care Management (RQ #3 Initial Call - Hypertensive Nephropathy, CKD, Pure Hypercholesterolemia, Class 3 severe obesity)   Third unsuccessful telephone outreach was attempted today. The patient was referred to the case management team for assistance with care management and care coordination. The patient's primary care provider has been notified of our unsuccessful attempts to make or maintain contact with the patient. The care management team is pleased to engage with this patient at any time in the future should he/she be interested in assistance from the care management team.   Follow Up Plan: A HIPPA compliant phone message was left for the patient providing contact information and requesting a return call.  The care management team is available to follow up with the patient after provider conversation with the patient regarding recommendation for care management engagement and subsequent re-referral to the care management team.    Barb Merino, RN, BSN, CCM Care Management Coordinator [place] [facility]  Direct Phone: [phone]

## 2019-05-08 ENCOUNTER — Ambulatory Visit: Payer: Medicare Other | Attending: Internal Medicine

## 2019-05-08 DIAGNOSIS — Z23 Encounter for immunization: Secondary | ICD-10-CM

## 2019-05-08 NOTE — Progress Notes (Signed)
   Covid-19 Vaccination Clinic  Name:  Vanessa Ward    MRN: [id number] DOB: 1945/10/17  05/08/2019  [name] was observed post Covid-19 immunization for 15 minutes without incident. She was provided with Vaccine Information Sheet and instruction to access the V-Safe system.   [name] was instructed to call 911 with any severe reactions post vaccine: Marland Kitchen Difficulty breathing  . Swelling of face and throat  . A fast heartbeat  . A bad rash all over body  . Dizziness and weakness   Immunizations Administered    Name Date Dose VIS Date Route   Pfizer COVID-19 Vaccine 05/08/2019 10:55 AM 0.3 mL 01/26/2019 Intramuscular   Manufacturer: [place]   Lot: [id number]   [place]: [id number]

## 2019-06-04 ENCOUNTER — Other Ambulatory Visit: Payer: Self-pay | Admitting: Internal Medicine

## 2019-06-11 ENCOUNTER — Other Ambulatory Visit: Payer: Self-pay | Admitting: Nurse Practitioner

## 2019-06-11 DIAGNOSIS — K219 Gastro-esophageal reflux disease without esophagitis: Secondary | ICD-10-CM

## 2019-07-09 ENCOUNTER — Other Ambulatory Visit: Payer: Self-pay

## 2019-07-09 ENCOUNTER — Encounter: Payer: Self-pay | Admitting: Internal Medicine

## 2019-07-09 ENCOUNTER — Ambulatory Visit (INDEPENDENT_AMBULATORY_CARE_PROVIDER_SITE_OTHER): Payer: Medicare Other | Admitting: Internal Medicine

## 2019-07-09 VITALS — BP 118/76 | HR 84 | Temp 98.4°F | Ht 63.6 in | Wt 232.0 lb

## 2019-07-09 DIAGNOSIS — F17211 Nicotine dependence, cigarettes, in remission: Secondary | ICD-10-CM

## 2019-07-09 DIAGNOSIS — N182 Chronic kidney disease, stage 2 (mild): Secondary | ICD-10-CM | POA: Diagnosis not present

## 2019-07-09 DIAGNOSIS — R7309 Other abnormal glucose: Secondary | ICD-10-CM | POA: Diagnosis not present

## 2019-07-09 DIAGNOSIS — I1310 Hypertensive heart and chronic kidney disease without heart failure, with stage 1 through stage 4 chronic kidney disease, or unspecified chronic kidney disease: Secondary | ICD-10-CM | POA: Insufficient documentation

## 2019-07-09 DIAGNOSIS — E7800 Pure hypercholesterolemia, unspecified: Secondary | ICD-10-CM | POA: Diagnosis not present

## 2019-07-09 DIAGNOSIS — Z6841 Body Mass Index (BMI) 40.0 and over, adult: Secondary | ICD-10-CM

## 2019-07-09 DIAGNOSIS — I129 Hypertensive chronic kidney disease with stage 1 through stage 4 chronic kidney disease, or unspecified chronic kidney disease: Secondary | ICD-10-CM | POA: Insufficient documentation

## 2019-07-09 DIAGNOSIS — I700 Atherosclerosis of aorta: Secondary | ICD-10-CM

## 2019-07-09 MED ORDER — ROSUVASTATIN CALCIUM 10 MG PO TABS: 10.0000 mg | ORAL_TABLET | Freq: Every day | ORAL | 1 refills | Status: DC

## 2019-07-09 MED ORDER — LISINOPRIL-HYDROCHLOROTHIAZIDE 20-25 MG PO TABS: 1.0000 | ORAL_TABLET | Freq: Every day | ORAL | 1 refills | Status: DC

## 2019-07-09 NOTE — Patient Instructions (Signed)

## 2019-07-09 NOTE — Progress Notes (Signed)
This visit occurred during the SARS-CoV-2 public health emergency.  Safety protocols were in place, including screening questions prior to the visit, additional usage of staff PPE, and extensive cleaning of exam room while observing appropriate contact time as indicated for disinfecting solutions.  Subjective:     Patient ID: Vanessa Ward , female    DOB: May 06, 1945 , 74 y.o.   MRN: [id number]   Chief Complaint  Patient presents with  . Hypertension  . Hyperlipidemia    HPI  She presents today for BP/chol check. She reports compliance with meds. Admits she is not exercising on a regular basis.   Hypertension This is a chronic problem. The current episode started more than 1 year ago. The problem has been gradually improving since onset. The problem is controlled. Associated symptoms include shortness of breath. Pertinent negatives include no blurred vision, chest pain or palpitations. Compliance problems include exercise.   Hyperlipidemia This is a chronic problem. The problem is controlled. Exacerbating diseases include obesity. Associated symptoms include shortness of breath. Pertinent negatives include no chest pain. Current antihyperlipidemic treatment includes statins. Risk factors for coronary artery disease include dyslipidemia, hypertension, obesity, post-menopausal and a sedentary lifestyle.     Past Medical History:  Diagnosis Date  . Arthritis   . Chronic kidney disease    resent diagnosis of hematuria due to benigh kidney tumor  . GERD (gastroesophageal reflux disease)   . Hypertension   . Shingles      Family History  Problem Relation Age of Onset  . Hypertension Mother   . Heart disease Mother   . Hypertension Father   . Diabetes Paternal Grandmother      Current Outpatient Medications:  .  aspirin EC 81 MG tablet, Take 81 mg by mouth daily., Disp: , Rfl:  .  Cholecalciferol (DIALYVITE VITAMIN D 5000 PO), Take by mouth., Disp: , Rfl:  .   lisinopril-hydrochlorothiazide (ZESTORETIC) 20-25 MG tablet, Take 1 tablet by mouth daily., Disp: 90 tablet, Rfl: 1 .  mometasone (NASONEX) 50 MCG/ACT nasal spray, Place 2 sprays into the nose daily., Disp: 17 g, Rfl: 1 .  omeprazole (PRILOSEC) 40 MG capsule, Take 1 capsule by mouth once daily, Disp: 90 capsule, Rfl: 0 .  rosuvastatin (CRESTOR) 10 MG tablet, Take 1 tablet (10 mg total) by mouth daily., Disp: 90 tablet, Rfl: 1   Allergies  Allergen Reactions  . Other Rash    Perfume, scented lotion     Review of Systems  Constitutional: Negative.   Eyes: Negative for blurred vision.  Respiratory: Positive for shortness of breath.   Cardiovascular: Negative.  Negative for chest pain and palpitations.  Gastrointestinal: Negative.   Neurological: Negative.   Psychiatric/Behavioral: Negative.      Today's Vitals   07/09/19 1040  BP: 118/76  Pulse: 84  Temp: 98.4 F (36.9 C)  TempSrc: Oral  Weight: 232 lb (105.2 kg)  Height: 5' 3.6" (1.615 m)  PainSc: 0-No pain   Body mass index is 40.33 kg/m.   Objective:  Physical Exam Vitals and nursing note reviewed.  Constitutional:      Appearance: Normal appearance. She is obese.  HENT:     Head: Normocephalic and atraumatic.  Cardiovascular:     Rate and Rhythm: Normal rate and regular rhythm.     Heart sounds: Normal heart sounds.  Pulmonary:     Effort: Pulmonary effort is normal.     Breath sounds: Normal breath sounds.  Skin:    General: Skin is  warm.  Neurological:     General: No focal deficit present.     Mental Status: She is alert.  Psychiatric:        Mood and Affect: Mood normal.        Behavior: Behavior normal.         Assessment And Plan:     1. Hypertensive nephropathy  Chronic, well controlled.  She will continue with current meds. She is encouraged to avoid adding salt to her foods. I will check renal function today.   - CMP14+EGFR  2. Chronic renal disease, stage II  Chronic, I will check renal  function today. She is encouraged to stay well hydrated.   3. Pure hypercholesterolemia  Chronic, I will check fasting lipid panel and LFTs. She is encouraged to avoid fried foods, exercise 150 minutes per week, and consider fiber supplementation.   - Lipid panel  4. Aortic atherosclerosis (HCC)  Chronic, yet stable. Encouraged to comply with statin therapy and follow a heart healthy diet.  5. Cigarette nicotine dependence in remission  She has 30+pack-year history of tobacco use. She is no longer smoking at this time. 2020 results were reviewed with her in full detail.   - CT CHEST LUNG CA SCREEN LOW DOSE W/O CM; Future  6. Class 3 severe obesity due to excess calories with serious comorbidity and body mass index (BMI) of 40.0 to 44.9 in adult [facility])  She is encouraged to strive for BMI less than 35 to decrease cardiac risk. She is encouraged to aim for at least 150 minutes of exercise per week.   7. Other abnormal glucose  HER A1C HAS BEEN ELEVATED IN THE PAST. I WILL CHECK AN A1C, BMET TODAY. SHE WAS ENCOURAGED TO AVOID SUGARY BEVERAGES AND PROCESSED FOODS INCLUDNG BREADS, RICE AND PASTA.  - Hemoglobin A1c    [name], MD    THE PATIENT IS ENCOURAGED TO PRACTICE SOCIAL DISTANCING DUE TO THE COVID-19 PANDEMIC.

## 2019-07-10 LAB — HEMOGLOBIN A1C
Est. average glucose Bld gHb Est-mCnc: 114 mg/dL
Hgb A1c MFr Bld: 5.6 % (ref 4.8–5.6)

## 2019-07-10 LAB — CMP14+EGFR
ALT: 16 IU/L (ref 0–32)
AST: 21 IU/L (ref 0–40)
Albumin/Globulin Ratio: 1.5 (ref 1.2–2.2)
Albumin: 4.3 g/dL (ref 3.7–4.7)
Alkaline Phosphatase: 64 IU/L (ref 48–121)
BUN/Creatinine Ratio: 20 (ref 12–28)
BUN: 22 mg/dL (ref 8–27)
Bilirubin Total: 0.5 mg/dL (ref 0.0–1.2)
CO2: 26 mmol/L (ref 20–29)
Calcium: 9 mg/dL (ref 8.7–10.3)
Chloride: 101 mmol/L (ref 96–106)
Creatinine, Ser: 1.1 mg/dL — ABNORMAL HIGH (ref 0.57–1.00)
GFR calc Af Amer: 58 mL/min/{1.73_m2} — ABNORMAL LOW (ref >59)
GFR calc non Af Amer: 50 mL/min/{1.73_m2} — ABNORMAL LOW (ref >59)
Globulin, Total: 2.9 g/dL (ref 1.5–4.5)
Glucose: 94 mg/dL (ref 65–99)
Potassium: 4.2 mmol/L (ref 3.5–5.2)
Sodium: 140 mmol/L (ref 134–144)
Total Protein: 7.2 g/dL (ref 6.0–8.5)

## 2019-07-10 LAB — LIPID PANEL
Chol/HDL Ratio: 2.7 ratio (ref 0.0–4.4)
Cholesterol, Total: 168 mg/dL (ref 100–199)
HDL: 63 mg/dL (ref >39)
LDL Chol Calc (NIH): 85 mg/dL (ref 0–99)
Triglycerides: 115 mg/dL (ref 0–149)
VLDL Cholesterol Cal: 20 mg/dL (ref 5–40)

## 2019-07-12 ENCOUNTER — Other Ambulatory Visit: Payer: Self-pay | Admitting: Internal Medicine

## 2019-07-17 ENCOUNTER — Other Ambulatory Visit: Payer: Self-pay | Admitting: Internal Medicine

## 2019-07-17 ENCOUNTER — Other Ambulatory Visit: Payer: Self-pay | Admitting: Nurse Practitioner

## 2019-07-17 DIAGNOSIS — K219 Gastro-esophageal reflux disease without esophagitis: Secondary | ICD-10-CM

## 2019-07-25 ENCOUNTER — Ambulatory Visit
Admission: RE | Admit: 2019-07-25 | Discharge: 2019-07-25 | Disposition: A | Discharge status: Home / Self Care | Payer: Medicare Other | Source: Ambulatory Visit | Attending: Internal Medicine | Admitting: Internal Medicine

## 2019-07-25 DIAGNOSIS — F17211 Nicotine dependence, cigarettes, in remission: Secondary | ICD-10-CM

## 2019-07-25 DIAGNOSIS — Z87891 Personal history of nicotine dependence: Secondary | ICD-10-CM | POA: Diagnosis not present

## 2019-09-06 ENCOUNTER — Other Ambulatory Visit: Payer: Self-pay | Admitting: Internal Medicine

## 2019-09-06 DIAGNOSIS — Z1231 Encounter for screening mammogram for malignant neoplasm of breast: Secondary | ICD-10-CM

## 2019-09-27 ENCOUNTER — Other Ambulatory Visit: Payer: Self-pay

## 2019-09-27 ENCOUNTER — Ambulatory Visit
Admission: RE | Admit: 2019-09-27 | Discharge: 2019-09-27 | Disposition: A | Discharge status: Home / Self Care | Payer: Medicare Other | Source: Ambulatory Visit | Attending: Internal Medicine | Admitting: Internal Medicine

## 2019-09-27 DIAGNOSIS — Z1231 Encounter for screening mammogram for malignant neoplasm of breast: Secondary | ICD-10-CM

## 2019-10-15 ENCOUNTER — Other Ambulatory Visit: Payer: Self-pay | Admitting: Internal Medicine

## 2019-10-15 DIAGNOSIS — K219 Gastro-esophageal reflux disease without esophagitis: Secondary | ICD-10-CM

## 2019-12-01 ENCOUNTER — Other Ambulatory Visit: Payer: Self-pay

## 2019-12-01 ENCOUNTER — Ambulatory Visit: Payer: Medicare Other | Attending: Internal Medicine

## 2019-12-01 DIAGNOSIS — Z23 Encounter for immunization: Secondary | ICD-10-CM

## 2019-12-01 NOTE — Progress Notes (Signed)
   Covid-19 Vaccination Clinic  Name:  Vanessa Ward    MRN: [id number] DOB: [birth year]  12/01/2019  [name] was observed post Covid-19 immunization for 15 minutes without incident. She was provided with Vaccine Information Sheet and instruction to access the V-Safe system.   [name] was instructed to call 911 with any severe reactions post vaccine: Marland Kitchen Difficulty breathing  . Swelling of face and throat  . A fast heartbeat  . A bad rash all over body  . Dizziness and weakness

## 2019-12-06 IMAGING — MG DIGITAL SCREENING BILATERAL MAMMOGRAM WITH TOMO AND CAD
8 series · 8 of 24 positions shown · non-contrast
Comparison: Previous exam(s).

ACR Breast Density Category a: The breast tissue is almost entirely
fatty.

CLINICAL DATA: Screening.

EXAM:
DIGITAL SCREENING BILATERAL MAMMOGRAM WITH TOMO AND CAD

[L CC synth-2D]
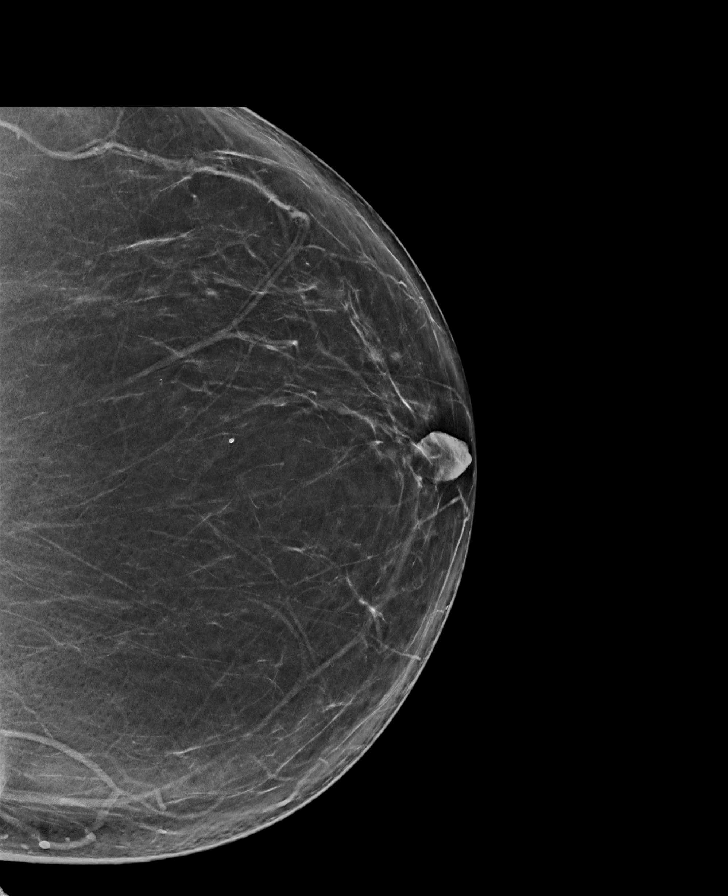

[L MLO synth-2D]
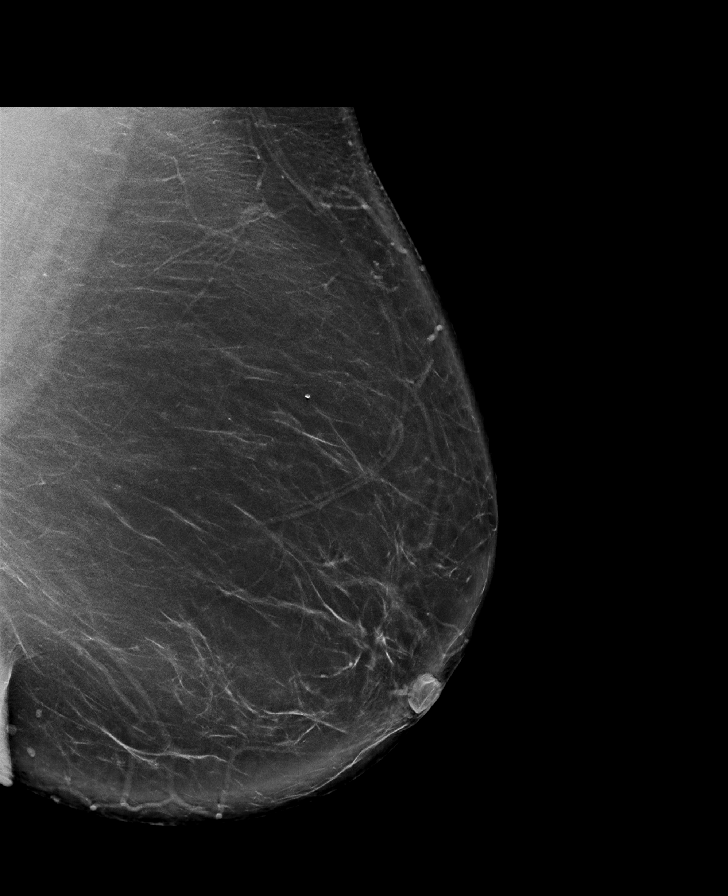

[R MLO synth-2D]
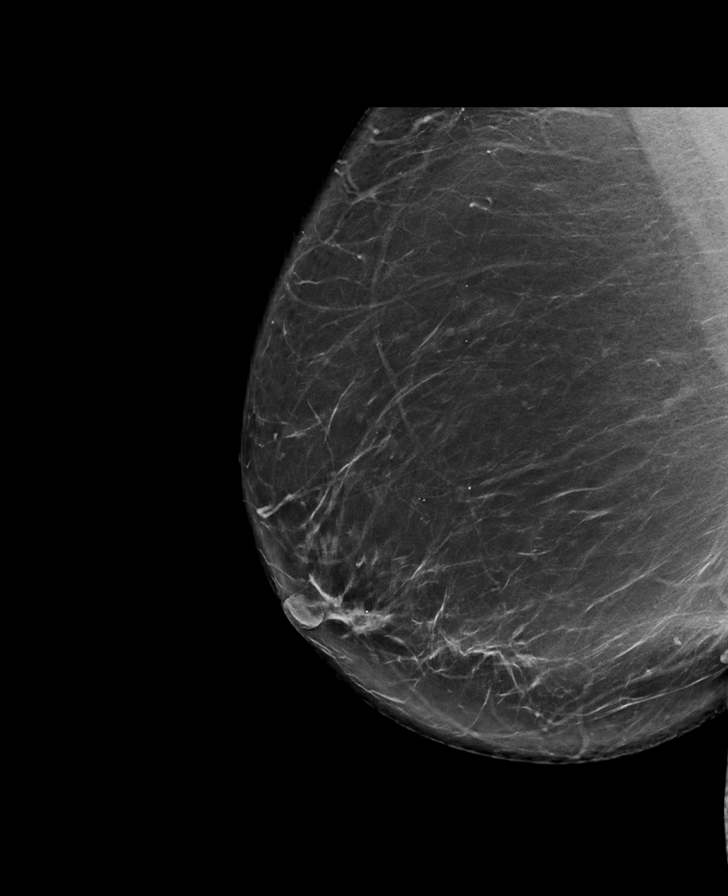

[R CC synth-2D]
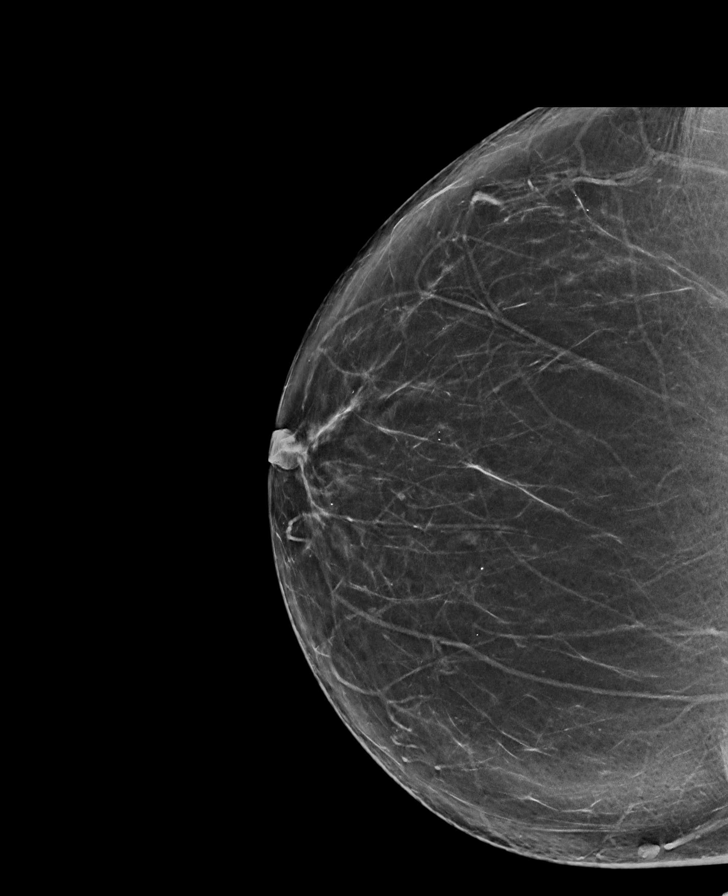

[L CC tomo · tomo slice 43/86.0]
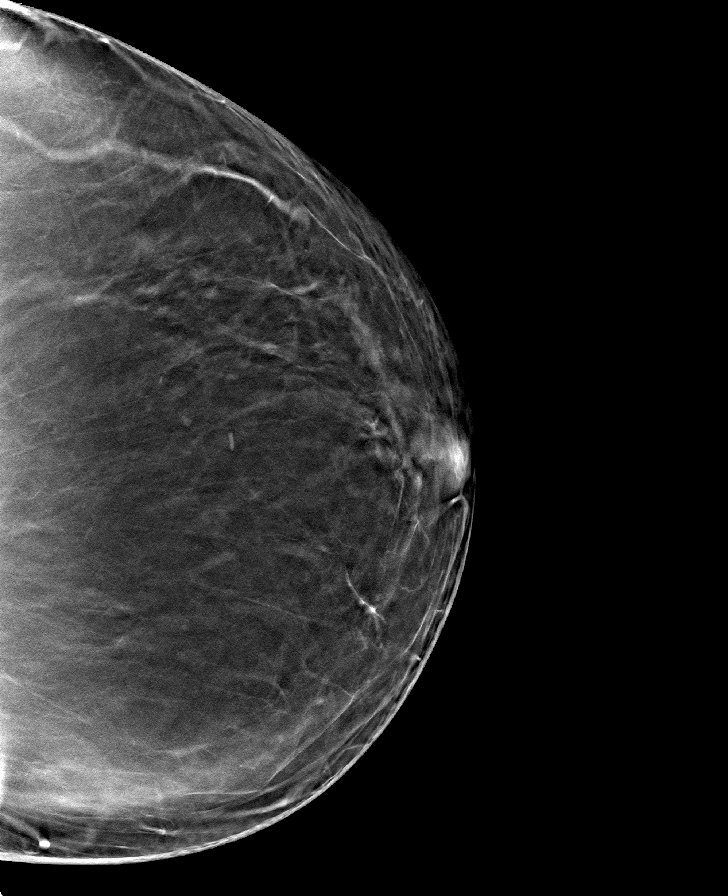

[R CC tomo · tomo slice 44/87.0]
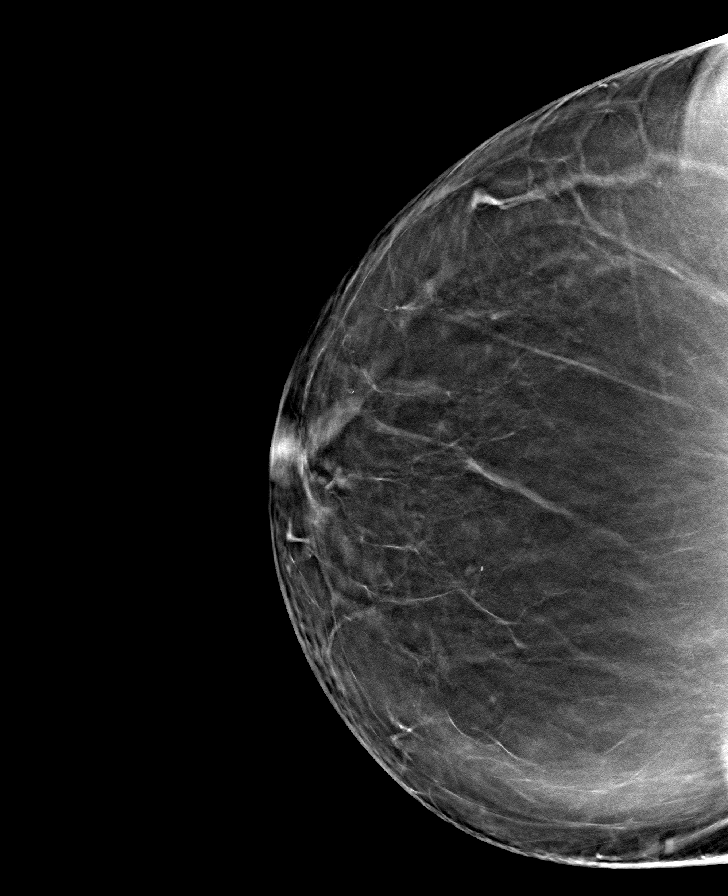

[L MLO tomo · tomo slice 53/105.0]
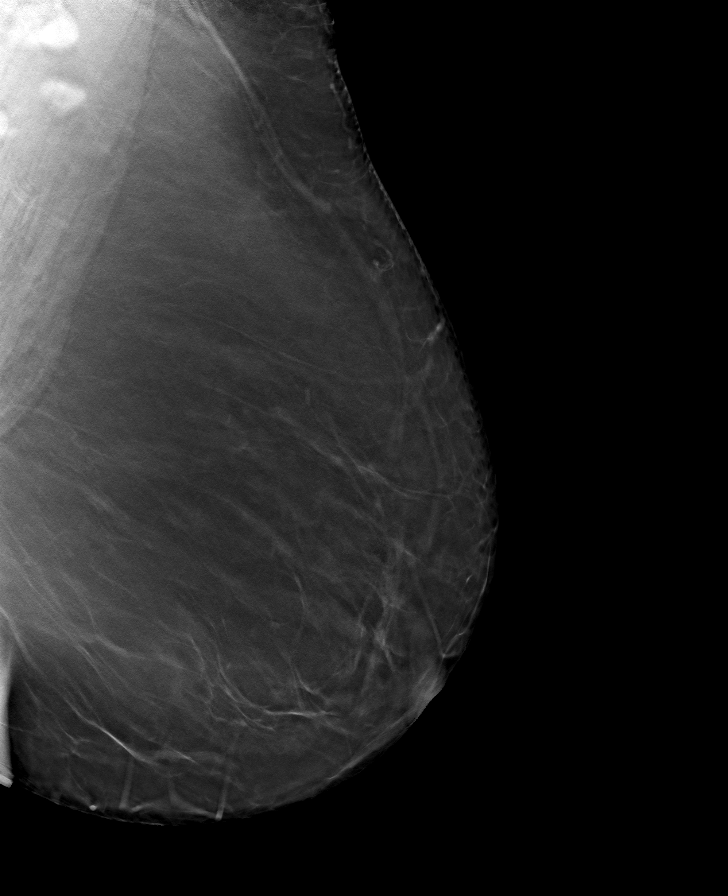

[R MLO tomo · tomo slice 49/97.0]
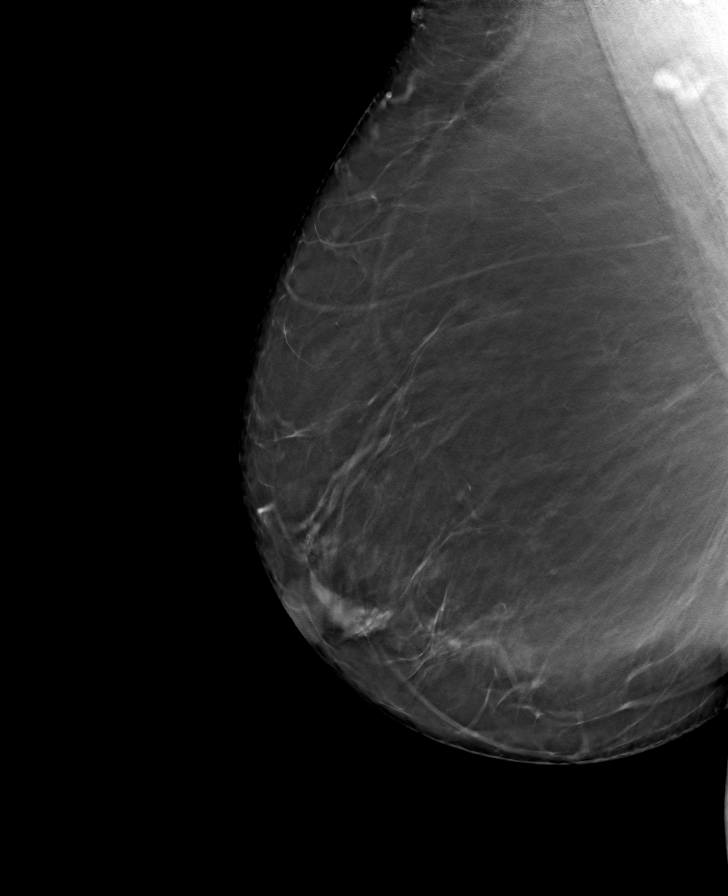

[8 of 24 positions shown; findings below may reference images not displayed]

FINDINGS: There are no findings suspicious for malignancy. Images were
processed with CAD.
IMPRESSION: No mammographic evidence of malignancy. A result letter of this
screening mammogram will be mailed directly to the patient.

RECOMMENDATION:
Screening mammogram in one year. (Code:[id number])

BI-RADS CATEGORY  1: Negative.

## 2020-01-01 ENCOUNTER — Ambulatory Visit: Payer: Medicare Other | Admitting: Internal Medicine

## 2020-01-03 ENCOUNTER — Ambulatory Visit (INDEPENDENT_AMBULATORY_CARE_PROVIDER_SITE_OTHER): Payer: Medicare Other

## 2020-01-03 ENCOUNTER — Ambulatory Visit (INDEPENDENT_AMBULATORY_CARE_PROVIDER_SITE_OTHER): Payer: Medicare Other | Admitting: Internal Medicine

## 2020-01-03 ENCOUNTER — Other Ambulatory Visit: Payer: Self-pay

## 2020-01-03 ENCOUNTER — Encounter: Payer: Self-pay | Admitting: Internal Medicine

## 2020-01-03 VITALS — BP 118/70 | Temp 98.6°F | Ht 63.6 in | Wt 235.0 lb

## 2020-01-03 VITALS — BP 118/70 | HR 76 | Temp 98.6°F | Ht 63.6 in | Wt 235.2 lb

## 2020-01-03 DIAGNOSIS — I700 Atherosclerosis of aorta: Secondary | ICD-10-CM | POA: Diagnosis not present

## 2020-01-03 DIAGNOSIS — N182 Chronic kidney disease, stage 2 (mild): Secondary | ICD-10-CM | POA: Diagnosis not present

## 2020-01-03 DIAGNOSIS — Z0000 Encounter for general adult medical examination without abnormal findings: Secondary | ICD-10-CM | POA: Diagnosis not present

## 2020-01-03 DIAGNOSIS — R202 Paresthesia of skin: Secondary | ICD-10-CM

## 2020-01-03 DIAGNOSIS — J432 Centrilobular emphysema: Secondary | ICD-10-CM

## 2020-01-03 DIAGNOSIS — Z6841 Body Mass Index (BMI) 40.0 and over, adult: Secondary | ICD-10-CM

## 2020-01-03 DIAGNOSIS — E7800 Pure hypercholesterolemia, unspecified: Secondary | ICD-10-CM | POA: Diagnosis not present

## 2020-01-03 DIAGNOSIS — I129 Hypertensive chronic kidney disease with stage 1 through stage 4 chronic kidney disease, or unspecified chronic kidney disease: Secondary | ICD-10-CM

## 2020-01-03 DIAGNOSIS — Z79899 Other long term (current) drug therapy: Secondary | ICD-10-CM

## 2020-01-03 MED ORDER — ALBUTEROL SULFATE HFA 108 (90 BASE) MCG/ACT IN AERS
2.0000 | INHALATION_SPRAY | Freq: Four times a day (QID) | RESPIRATORY_TRACT | 2 refills | Status: DC | PRN
Start: 2020-01-03 — End: 2020-07-23

## 2020-01-03 NOTE — Patient Instructions (Signed)

## 2020-01-03 NOTE — Patient Instructions (Signed)
Ms. Vanessa Ward , Thank you for taking time to come for your Medicare Wellness Visit. I appreciate your ongoing commitment to your health goals. Please review the following plan we discussed and let me know if I can assist you in the future.   Screening recommendations/referrals: Colonoscopy: completed 2019 per patient Mammogram: completed 09/27/2019, due 09/26/2020 Bone Density: completed 12/08/2016 Recommended yearly ophthalmology/optometry visit for glaucoma screening and checkup Recommended yearly dental visit for hygiene and checkup  Vaccinations: Influenza vaccine: out of stock Pneumococcal vaccine: completed 07/12/2013 Tdap vaccine: completed 06/12/2012, due 06/13/2022 Shingles vaccine: discussed   Covid-19: 12/01/2019, 05/08/2019, 04/12/2019  Advanced directives: Advance directive discussed with you today. Even though you declined this today please call our office should you change your mind and we can give you the proper paperwork for you to fill out.  Conditions/risks identified: none  Next appointment: 05/05/2020 at 10:00 Follow up in one year for your annual wellness visit    Preventive Care 65 Years and Older, Female Preventive care refers to lifestyle choices and visits with your health care provider that can promote health and wellness. What does preventive care include?  A yearly physical exam. This is also called an annual well check.  Dental exams once or twice a year.  Routine eye exams. Ask your health care provider how often you should have your eyes checked.  Personal lifestyle choices, including:  Daily care of your teeth and gums.  Regular physical activity.  Eating a healthy diet.  Avoiding tobacco and drug use.  Limiting alcohol use.  Practicing safe sex.  Taking low-dose aspirin every day.  Taking vitamin and mineral supplements as recommended by your health care provider. What happens during an annual well check? The services and screenings done by  your health care provider during your annual well check will depend on your age, overall health, lifestyle risk factors, and family history of disease. Counseling  Your health care provider may ask you questions about your:  Alcohol use.  Tobacco use.  Drug use.  Emotional well-being.  Home and relationship well-being.  Sexual activity.  Eating habits.  History of falls.  Memory and ability to understand (cognition).  Work and work Statistician.  Reproductive health. Screening  You may have the following tests or measurements:  Height, weight, and BMI.  Blood pressure.  Lipid and cholesterol levels. These may be checked every 5 years, or more frequently if you are over 39 years old.  Skin check.  Lung cancer screening. You may have this screening every year starting at age 34 if you have a 30-pack-year history of smoking and currently smoke or have quit within the past 15 years.  Fecal occult blood test (FOBT) of the stool. You may have this test every year starting at age 36.  Flexible sigmoidoscopy or colonoscopy. You may have a sigmoidoscopy every 5 years or a colonoscopy every 10 years starting at age 20.  Hepatitis C blood test.  Hepatitis B blood test.  Sexually transmitted disease (STD) testing.  Diabetes screening. This is done by checking your blood sugar (glucose) after you have not eaten for a while (fasting). You may have this done every 1-3 years.  Bone density scan. This is done to screen for osteoporosis. You may have this done starting at age 33.  Mammogram. This may be done every 1-2 years. Talk to your health care provider about how often you should have regular mammograms. Talk with your health care provider about your test results, treatment options,  and if necessary, the need for more tests. Vaccines  Your health care provider may recommend certain vaccines, such as:  Influenza vaccine. This is recommended every year.  Tetanus,  diphtheria, and acellular pertussis (Tdap, Td) vaccine. You may need a Td booster every 10 years.  Zoster vaccine. You may need this after age 49.  Pneumococcal 13-valent conjugate (PCV13) vaccine. One dose is recommended after age 46.  Pneumococcal polysaccharide (PPSV23) vaccine. One dose is recommended after age 77. Talk to your health care provider about which screenings and vaccines you need and how often you need them. This information is not intended to replace advice given to you by your health care provider. Make sure you discuss any questions you have with your health care provider. Document Released: 02/28/2015 Document Revised: 10/22/2015 Document Reviewed: 12/03/2014 Elsevier Interactive Patient Education  2017 [employer] Prevention in the Home Falls can cause injuries. They can happen to people of all ages. There are many things you can do to make your home safe and to help prevent falls. What can I do on the outside of my home?  Regularly fix the edges of walkways and driveways and fix any cracks.  Remove anything that might make you trip as you walk through a door, such as a raised step or threshold.  Trim any bushes or trees on the path to your home.  Use bright outdoor lighting.  Clear any walking paths of anything that might make someone trip, such as rocks or tools.  Regularly check to see if handrails are loose or broken. Make sure that both sides of any steps have handrails.  Any raised decks and porches should have guardrails on the edges.  Have any leaves, snow, or ice cleared regularly.  Use sand or salt on walking paths during winter.  Clean up any spills in your garage right away. This includes oil or grease spills. What can I do in the bathroom?  Use night lights.  Install grab bars by the toilet and in the tub and shower. Do not use towel bars as grab bars.  Use non-skid mats or decals in the tub or shower.  If you need to sit down in  the shower, use a plastic, non-slip stool.  Keep the floor dry. Clean up any water that spills on the floor as soon as it happens.  Remove soap buildup in the tub or shower regularly.  Attach bath mats securely with double-sided non-slip rug tape.  Do not have throw rugs and other things on the floor that can make you trip. What can I do in the bedroom?  Use night lights.  Make sure that you have a light by your bed that is easy to reach.  Do not use any sheets or blankets that are too big for your bed. They should not hang down onto the floor.  Have a firm chair that has side arms. You can use this for support while you get dressed.  Do not have throw rugs and other things on the floor that can make you trip. What can I do in the kitchen?  Clean up any spills right away.  Avoid walking on wet floors.  Keep items that you use a lot in easy-to-reach places.  If you need to reach something above you, use a strong step stool that has a grab bar.  Keep electrical cords out of the way.  Do not use floor polish or wax that makes floors slippery. If  you must use wax, use non-skid floor wax.  Do not have throw rugs and other things on the floor that can make you trip. What can I do with my stairs?  Do not leave any items on the stairs.  Make sure that there are handrails on both sides of the stairs and use them. Fix handrails that are broken or loose. Make sure that handrails are as long as the stairways.  Check any carpeting to make sure that it is firmly attached to the stairs. Fix any carpet that is loose or worn.  Avoid having throw rugs at the top or bottom of the stairs. If you do have throw rugs, attach them to the floor with carpet tape.  Make sure that you have a light switch at the top of the stairs and the bottom of the stairs. If you do not have them, ask someone to add them for you. What else can I do to help prevent falls?  Wear shoes that:  Do not have high  heels.  Have rubber bottoms.  Are comfortable and fit you well.  Are closed at the toe. Do not wear sandals.  If you use a stepladder:  Make sure that it is fully opened. Do not climb a closed stepladder.  Make sure that both sides of the stepladder are locked into place.  Ask someone to hold it for you, if possible.  Clearly mark and make sure that you can see:  Any grab bars or handrails.  First and last steps.  Where the edge of each step is.  Use tools that help you move around (mobility aids) if they are needed. These include:  Canes.  Walkers.  Scooters.  Crutches.  Turn on the lights when you go into a dark area. Replace any light bulbs as soon as they burn out.  Set up your furniture so you have a clear path. Avoid moving your furniture around.  If any of your floors are uneven, fix them.  If there are any pets around you, be aware of where they are.  Review your medicines with your doctor. Some medicines can make you feel dizzy. This can increase your chance of falling. Ask your doctor what other things that you can do to help prevent falls. This information is not intended to replace advice given to you by your health care provider. Make sure you discuss any questions you have with your health care provider. Document Released: 11/28/2008 Document Revised: 07/10/2015 Document Reviewed: 03/08/2014 Elsevier Interactive Patient Education  2017 [employer].

## 2020-01-03 NOTE — Progress Notes (Signed)
This visit occurred during the SARS-CoV-2 public health emergency.  Safety protocols were in place, including screening questions prior to the visit, additional usage of staff PPE, and extensive cleaning of exam room while observing appropriate contact time as indicated for disinfecting solutions.  Subjective:   Vanessa Ward is a 74 y.o. female who presents for Medicare Annual (Subsequent) preventive examination.  Review of Systems     Cardiac Risk Factors include: advanced age (>49men, >55 women);dyslipidemia;hypertension;obesity (BMI >30kg/m2);sedentary lifestyle     Objective:    Today's Vitals   01/03/20 1440  BP: 118/70  Temp: 98.6 F (37 C)  TempSrc: Oral  Weight: 235 lb (106.6 kg)  Height: 5' 3.6" (1.615 m)   Body mass index is 40.85 kg/m.  Advanced Directives 01/03/2020 12/27/2018 12/21/2017 02/06/2014 12/13/2011 10/05/2011  Does Patient Have a Medical Advance Directive? No No No No Patient does not have advance directive Patient does not have advance directive  Would patient like information on creating a medical advance directive? No - Patient declined - Yes (MAU/Ambulatory/Procedural Areas - Information given) No - patient declined information - -    Current Medications (verified) Outpatient Encounter Medications as of 01/03/2020  Medication Sig  . albuterol (VENTOLIN HFA) 108 (90 Base) MCG/ACT inhaler Inhale 2 puffs into the lungs every 6 (six) hours as needed for wheezing or shortness of breath.  Marland Kitchen aspirin EC 81 MG tablet Take 81 mg by mouth daily.  . Cholecalciferol (DIALYVITE VITAMIN D 5000 PO) Take by mouth.  Marland Kitchen lisinopril-hydrochlorothiazide (ZESTORETIC) 20-25 MG tablet Take 1 tablet by mouth once daily  . mometasone (NASONEX) 50 MCG/ACT nasal spray Place 2 sprays into the nose daily.  Marland Kitchen omeprazole (PRILOSEC) 40 MG capsule Take 1 capsule by mouth once daily  . rosuvastatin (CRESTOR) 10 MG tablet Take 1 tablet (10 mg total) by mouth daily.   No  facility-administered encounter medications on file as of 01/03/2020.    Allergies (verified) Other   History: Past Medical History:  Diagnosis Date  . Arthritis   . Chronic kidney disease    resent diagnosis of hematuria due to benigh kidney tumor  . GERD (gastroesophageal reflux disease)   . Hypertension   . Shingles    Past Surgical History:  Procedure Laterality Date  . BIOPSY  10/15/2011   Procedure: BIOPSY;  Surgeon: Lahoma Crocker, MD;  Location: [facility];  Service: Gynecology;;  Cervical  . CERVICAL CONIZATION W/BX  12/17/2011   Procedure: CONIZATION CERVIX WITH BIOPSY;  Surgeon: Lahoma Crocker, MD;  Location: [facility];  Service: Gynecology;  Laterality: N/A;  . COLPOSCOPY  10/15/2011   Procedure: COLPOSCOPY;  Surgeon: Lahoma Crocker, MD;  Location: [facility];  Service: Gynecology;  Laterality: N/A;  . DIAGNOSTIC LAPAROSCOPY    . TUBAL LIGATION     Family History  Problem Relation Age of Onset  . Hypertension Mother   . Heart disease Mother   . Hypertension Father   . Diabetes Paternal Grandmother    Social History   Socioeconomic History  . Marital status: Single    Spouse name: Not on file  . Number of children: Not on file  . Years of education: Not on file  . Highest education level: Not on file  Occupational History  . Not on file  Tobacco Use  . Smoking status: Former Smoker    Packs/day: 0.50    Types: Cigarettes, Pipe    Start date: 66    Quit date: 2016    Years since quitting:  5.8  . Smokeless tobacco: Never Used  Vaping Use  . Vaping Use: Never used  Substance and Sexual Activity  . Alcohol use: Yes    Alcohol/week: 2.0 standard drinks    Types: 2 Standard drinks or equivalent per week  . Drug use: No  . Sexual activity: Not Currently    Birth control/protection: Post-menopausal  Other Topics Concern  . Not on file  Social History Narrative  . Not on file   Social Determinants of Health   Financial Resource Strain: Low Risk     . Difficulty of Paying Living Expenses: Not hard at all  Food Insecurity: No Food Insecurity  . Worried About Charity fundraiser in the Last Year: Never true  . Ran Out of Food in the Last Year: Never true  Transportation Needs: No Transportation Needs  . Lack of Transportation (Medical): No  . Lack of Transportation (Non-Medical): No  Physical Activity: Inactive  . Days of Exercise per Week: 0 days  . Minutes of Exercise per Session: 0 min  Stress: No Stress Concern Present  . Feeling of Stress : Not at all  Social Connections:   . Frequency of Communication with Friends and Family: Not on file  . Frequency of Social Gatherings with Friends and Family: Not on file  . Attends Religious Services: Not on file  . Active Member of Clubs or Organizations: Not on file  . Attends Archivist Meetings: Not on file  . Marital Status: Not on file    Tobacco Counseling Counseling given: Not Answered   Clinical Intake:  Pre-visit preparation completed: Yes  Pain : No/denies pain     Nutritional Status: BMI > 30  Obese Nutritional Risks: None Diabetes: No  How often do you need to have someone help you when you read instructions, pamphlets, or other written materials from your doctor or pharmacy?: 1 - Never What is the last grade level you completed in school?: 12th grade  Diabetic? no  Interpreter Needed?: No  Information entered by :: NAllen LPN   Activities of Daily Living In your present state of health, do you have any difficulty performing the following activities: 01/03/2020 01/03/2020  Hearing? N N  Vision? N N  Difficulty concentrating or making decisions? N Y  Walking or climbing stairs? N N  Dressing or bathing? N N  Doing errands, shopping? N N  Preparing Food and eating ? N -  Using the Toilet? N -  In the past six months, have you accidently leaked urine? N -  Do you have problems with loss of bowel control? N -  Managing your Medications? N -   Managing your Finances? N -  Housekeeping or managing your Housekeeping? N -  Some recent data might be hidden    Patient Care Team: Glendale Chard, MD as PCP - General (Internal Medicine)  Indicate any recent Medical Services you may have received from other than [facility] providers in the past year (date may be approximate).     Assessment:   This is a routine wellness examination for Kimbra.  Hearing/Vision screen  Hearing Screening   125Hz  250Hz  500Hz  1000Hz  2000Hz  3000Hz  4000Hz  6000Hz  8000Hz   Right ear:           Left ear:           Vision Screening Comments: No regular eye exams, [facility]  Dietary issues and exercise activities discussed: Current Exercise Habits: The patient does not participate in regular  exercise at present  Goals    . Patient Stated     12/27/2018, no goals set at this time    . Patient Stated     01/03/2020, no goals      Depression Screen PHQ 2/9 Scores 01/03/2020 01/03/2020 12/27/2018 08/02/2018 04/06/2018 12/21/2017 01/14/2016  PHQ - 2 Score 0 0 0 0 4 0 0  PHQ- 9 Score - - - - 5 - -  Exception Documentation - - - - Other- indicate reason in comment box - -  Not completed - - - - Patient stated her girlfriend of 40 years just passed away on May 13, 2022. - -    Fall Risk Fall Risk  01/03/2020 01/03/2020 12/27/2018 08/02/2018 04/06/2018  Falls in the past year? 0 0 0 0 0  Comment - - - - -  Number falls in past yr: - - 0 - -  Injury with Fall? - - - - -  Risk for fall due to : Medication side effect - Medication side effect - -  Follow up Falls evaluation completed;Education provided;Falls prevention discussed - Falls evaluation completed;Education provided;Falls prevention discussed - -    Any stairs in or around the home? Yes  If so, are there any without handrails? Yes  Home free of loose throw rugs in walkways, pet beds, electrical cords, etc? Yes  Adequate lighting in your home to reduce risk of falls? Yes   ASSISTIVE DEVICES UTILIZED  TO PREVENT FALLS:  Life alert? No  Use of a cane, walker or w/c? No  Grab bars in the bathroom? No  Shower chair or bench in shower? No  Elevated toilet seat or a handicapped toilet? No   TIMED UP AND GO:  Was the test performed? No .   Gait steady and fast without use of assistive device  Cognitive Function:     6CIT Screen 01/03/2020 12/27/2018 12/21/2017  What Year? 0 points 0 points 0 points  What month? 0 points 0 points 0 points  What time? 0 points 0 points 3 points  Count back from 20 0 points 0 points 0 points  Months in reverse 0 points 0 points 0 points  Repeat phrase 0 points 2 points 0 points  Total Score 0 2 3    Immunizations Immunization History  Administered Date(s) Administered  . Influenza, High Dose Seasonal PF 12/21/2017, 12/27/2018  . PFIZER SARS-COV-2 Vaccination 04/12/2019, 05/08/2019, 12/01/2019  . Zoster 09/25/2013    TDAP status: Up to date Flu Vaccine status: out of stock Pneumococcal vaccine status: Up to date Covid-19 vaccine status: Completed vaccines  Qualifies for Shingles Vaccine? Yes   Zostavax completed Yes   Shingrix Completed?: No.    Education has been provided regarding the importance of this vaccine. Patient has been advised to call insurance company to determine out of pocket expense if they have not yet received this vaccine. Advised may also receive vaccine at local pharmacy or Health Dept. Verbalized acceptance and understanding.  Screening Tests Health Maintenance  Topic Date Due  . COLONOSCOPY  03/11/2018  . INFLUENZA VACCINE  05/15/2020 (Originally 09/16/2019)  . MAMMOGRAM  09/26/2021  . TETANUS/TDAP  06/13/2022  . DEXA SCAN  Completed  . COVID-19 Vaccine  Completed  . Hepatitis C Screening  Completed  . PNA vac Low Risk Adult  Completed    Health Maintenance  Health Maintenance Due  Topic Date Due  . COLONOSCOPY  03/11/2018    Colorectal cancer screening: Completed 2019. Repeat every 10 years  Mammogram  status: Completed 09/27/2019. Repeat every year Bone Density status: Completed 12/08/2016.   Lung Cancer Screening: (Low Dose CT Chest recommended if Age 14-80 years, 30 pack-year currently smoking OR have quit w/in 15years.) does not qualify.   Lung Cancer Screening Referral: no  Additional Screening:  Hepatitis C Screening: does qualify; Completed 08/02/2018  Vision Screening: Recommended annual ophthalmology exams for early detection of glaucoma and other disorders of the eye. Is the patient up to date with their annual eye exam?  Yes  Who is the provider or what is the name of the office in which the patient attends annual eye exams? [facility] If pt is not established with a provider, would they like to be referred to a provider to establish care? No .   Dental Screening: Recommended annual dental exams for proper oral hygiene  Community Resource Referral / Chronic Care Management: CRR required this visit?  No   CCM required this visit?  No      Plan:     I have personally reviewed and noted the following in the patient's chart:   . Medical and social history . Use of alcohol, tobacco or illicit drugs  . Current medications and supplements . Functional ability and status . Nutritional status . Physical activity . Advanced directives . List of other physicians . Hospitalizations, surgeries, and ER visits in previous 12 months . Vitals . Screenings to include cognitive, depression, and falls . Referrals and appointments  In addition, I have reviewed and discussed with patient certain preventive protocols, quality metrics, and best practice recommendations. A written personalized care plan for preventive services as well as general preventive health recommendations were provided to patient.     Kellie Simmering, LPN   03/28/1733   Nurse Notes:

## 2020-01-03 NOTE — Progress Notes (Signed)
I,Katawbba Wiggins,acting as a Education administrator for [name], MD.,have documented all relevant documentation on the behalf of [name], MD,as directed by  [name], MD while in the presence of [name], MD.  This visit occurred during the SARS-CoV-2 public health emergency.  Safety protocols were in place, including screening questions prior to the visit, additional usage of staff PPE, and extensive cleaning of exam room while observing appropriate contact time as indicated for disinfecting solutions.  Subjective:     Patient ID: Vanessa Ward , female    DOB: [birth year] , 74 y.o.   MRN: [id number]   Chief Complaint  Patient presents with  . Hypertension  . Hyperlipidemia    HPI  She presents today for BP/chol check.  She reports compliance with meds. States she exercises 2-3 days per week. She is also scheduled for AWV with [facility] Advisor today.   Hypertension This is a chronic problem. The current episode started more than 1 year ago. The problem has been gradually improving since onset. The problem is controlled. Associated symptoms include shortness of breath. Pertinent negatives include no blurred vision, chest pain or palpitations. Compliance problems include exercise.   Hyperlipidemia This is a chronic problem. The problem is controlled. Exacerbating diseases include obesity. Associated symptoms include shortness of breath. Pertinent negatives include no chest pain. Current antihyperlipidemic treatment includes statins. Risk factors for coronary artery disease include dyslipidemia, hypertension, obesity, post-menopausal and a sedentary lifestyle.     Past Medical History:  Diagnosis Date  . Arthritis   . Chronic kidney disease    resent diagnosis of hematuria due to benigh kidney tumor  . GERD (gastroesophageal reflux disease)   . Hypertension   . Shingles      Family History  Problem Relation Age of Onset  . Hypertension Mother   . Heart  disease Mother   . Hypertension Father   . Diabetes Paternal Grandmother      Current Outpatient Medications:  .  aspirin EC 81 MG tablet, Take 81 mg by mouth daily., Disp: , Rfl:  .  Cholecalciferol (DIALYVITE VITAMIN D 5000 PO), Take by mouth., Disp: , Rfl:  .  lisinopril-hydrochlorothiazide (ZESTORETIC) 20-25 MG tablet, Take 1 tablet by mouth once daily, Disp: 90 tablet, Rfl: 0 .  mometasone (NASONEX) 50 MCG/ACT nasal spray, Place 2 sprays into the nose daily., Disp: 17 g, Rfl: 1 .  omeprazole (PRILOSEC) 40 MG capsule, Take 1 capsule by mouth once daily, Disp: 90 capsule, Rfl: 1 .  rosuvastatin (CRESTOR) 10 MG tablet, Take 1 tablet (10 mg total) by mouth daily., Disp: 90 tablet, Rfl: 1 .  albuterol (VENTOLIN HFA) 108 (90 Base) MCG/ACT inhaler, Inhale 2 puffs into the lungs every 6 (six) hours as needed for wheezing or shortness of breath., Disp: 18 g, Rfl: 2   Allergies  Allergen Reactions  . Other Rash    Perfume, scented lotion     Review of Systems  Constitutional: Negative.   Eyes: Negative for blurred vision.  Respiratory: Positive for shortness of breath.        She admits she has noticed she is becoming more SOB. She reports she walks in her neighborhood regularly, but she is still SOB. Denies cp, palpitations.   Cardiovascular: Negative.  Negative for chest pain and palpitations.  Gastrointestinal: Negative.   Neurological: Positive for numbness (fingers).       She c/o numbness in both hands, started about two weeks ago. It is 2nd  and 3rd fingers of both hands. Denies fall/trauma. She crochets on a regular basis.   Psychiatric/Behavioral: Negative.   All other systems reviewed and are negative.    Today's Vitals   01/03/20 1409  BP: 118/70  Pulse: 76  Temp: 98.6 F (37 C)  TempSrc: Oral  Weight: 235 lb 3.2 oz (106.7 kg)  Height: 5' 3.6" (1.615 m)  PainSc: 1   PainLoc: Back   Body mass index is 40.88 kg/m.  Wt Readings from Last 3 Encounters:  01/03/20 235  lb (106.6 kg)  01/03/20 235 lb 3.2 oz (106.7 kg)  07/09/19 232 lb (105.2 kg)   Objective:  Physical Exam Vitals and nursing note reviewed.  Constitutional:      Appearance: Normal appearance. She is obese.  HENT:     Head: Normocephalic and atraumatic.  Cardiovascular:     Rate and Rhythm: Normal rate and regular rhythm.     Heart sounds: Normal heart sounds.  Pulmonary:     Breath sounds: Normal breath sounds.  Musculoskeletal:     Comments: Neg Tinel's, Phalen's signs b/l  Skin:    General: Skin is warm.  Neurological:     General: No focal deficit present.     Mental Status: She is alert and oriented to person, place, and time.         Assessment And Plan:     1. Hypertensive nephropathy Comments: Chronic, well controlled. She will continue with current meds. I will check renal function.  She will f/u in six months for re-evaluation.  - CMP14+EGFR  2. Chronic renal disease, stage II Comments: Chronic, last GFR 58 in May 2021. I will recheck this today. Pt advised kidney function had decreased slightly last visit. Encouraged to stay well hydrated.  - CMP14+EGFR  3. Pure hypercholesterolemia Comments: Chronic, encouraged to live heart healthy lifestyle. Avoid fried foods, exercise regularly and take meds as prescribed.   4. Centrilobular emphysema ([place]) Comments: She admits to being more SOB.  Low dose CT chest results reviewed in full detail. She agrees to pulmonary evaluation. She was given rx albuterol MDI to use prn.  5. Paresthesia of both hands Comments: Her sx are suggestive of CTS. She reports having wrist splints at home, advised to wear nightly. Encouraged to let me know if her sx persist. If so, I will consider nerve conduction study.  - TSH - CBC no Diff  6. Atherosclerosis of aorta ([place]) Comments: Chronic, encouraged to follow heart healthy diet. Advised to comply with statin therapy.   7. Class 3 severe obesity due to excess calories with serious  comorbidity and body mass index (BMI) of 40.0 to 44.9 in adult [facility]) Comments: BMI 40 - she is encouraged to strive to lose ten percent of her body weight, 23 pounds over the next several months. Importance of regular exercise was again stressed to the patient.   8. Drug therapy - Vitamin B12    Patient was given opportunity to ask questions. Patient verbalized understanding of the plan and was able to repeat key elements of the plan. All questions were answered to their satisfaction.  [name], MD   I, [name], MD, have reviewed all documentation for this visit. The documentation on 01/03/20 for the exam, diagnosis, procedures, and orders are all accurate and complete.  THE PATIENT IS ENCOURAGED TO PRACTICE SOCIAL DISTANCING DUE TO THE COVID-19 PANDEMIC.

## 2020-01-04 LAB — CMP14+EGFR
ALT: 16 IU/L (ref 0–32)
AST: 24 IU/L (ref 0–40)
Albumin/Globulin Ratio: 1.5 (ref 1.2–2.2)
Albumin: 4.1 g/dL (ref 3.7–4.7)
Alkaline Phosphatase: 57 IU/L (ref 44–121)
BUN/Creatinine Ratio: 20 (ref 12–28)
BUN: 22 mg/dL (ref 8–27)
Bilirubin Total: 0.4 mg/dL (ref 0.0–1.2)
CO2: 25 mmol/L (ref 20–29)
Calcium: 8.8 mg/dL (ref 8.7–10.3)
Chloride: 103 mmol/L (ref 96–106)
Creatinine, Ser: 1.11 mg/dL — ABNORMAL HIGH (ref 0.57–1.00)
GFR calc Af Amer: 57 mL/min/{1.73_m2} — ABNORMAL LOW (ref >59)
GFR calc non Af Amer: 49 mL/min/{1.73_m2} — ABNORMAL LOW (ref >59)
Globulin, Total: 2.7 g/dL (ref 1.5–4.5)
Glucose: 87 mg/dL (ref 65–99)
Potassium: 3.8 mmol/L (ref 3.5–5.2)
Sodium: 142 mmol/L (ref 134–144)
Total Protein: 6.8 g/dL (ref 6.0–8.5)

## 2020-01-04 LAB — CBC
Hematocrit: 35.7 % (ref 34.0–46.6)
Hemoglobin: 12 g/dL (ref 11.1–15.9)
MCH: 29.8 pg (ref 26.6–33.0)
MCHC: 33.6 g/dL (ref 31.5–35.7)
MCV: 89 fL (ref 79–97)
Platelets: 228 10*3/uL (ref 150–450)
RBC: 4.03 x10E6/uL (ref 3.77–5.28)
RDW: 12.9 % (ref 11.7–15.4)
WBC: 8.6 10*3/uL (ref 3.4–10.8)

## 2020-01-04 LAB — VITAMIN B12: Vitamin B-12: 520 pg/mL (ref 232–1245)

## 2020-01-04 LAB — TSH: TSH: 0.83 u[IU]/mL (ref 0.450–4.500)

## 2020-01-15 ENCOUNTER — Ambulatory Visit (INDEPENDENT_AMBULATORY_CARE_PROVIDER_SITE_OTHER): Payer: Medicare Other

## 2020-01-15 ENCOUNTER — Other Ambulatory Visit: Payer: Self-pay | Admitting: Internal Medicine

## 2020-01-15 ENCOUNTER — Other Ambulatory Visit: Payer: Self-pay

## 2020-01-15 VITALS — BP 122/76 | HR 74 | Temp 98.3°F | Ht 63.6 in | Wt 235.0 lb

## 2020-01-15 DIAGNOSIS — Z23 Encounter for immunization: Secondary | ICD-10-CM

## 2020-01-15 NOTE — Progress Notes (Signed)
Pt here today for flu shot

## 2020-03-03 ENCOUNTER — Other Ambulatory Visit: Payer: Self-pay | Admitting: Internal Medicine

## 2020-04-07 ENCOUNTER — Other Ambulatory Visit: Payer: Self-pay | Admitting: Internal Medicine

## 2020-04-07 DIAGNOSIS — K219 Gastro-esophageal reflux disease without esophagitis: Secondary | ICD-10-CM

## 2020-05-05 ENCOUNTER — Ambulatory Visit: Payer: Medicare Other | Admitting: Internal Medicine

## 2020-05-05 ENCOUNTER — Encounter: Payer: Medicare Other | Admitting: Internal Medicine

## 2020-05-07 ENCOUNTER — Ambulatory Visit: Payer: Medicare Other | Admitting: Internal Medicine

## 2020-05-07 ENCOUNTER — Other Ambulatory Visit: Payer: Self-pay

## 2020-05-07 ENCOUNTER — Ambulatory Visit (INDEPENDENT_AMBULATORY_CARE_PROVIDER_SITE_OTHER): Payer: Medicare Other | Admitting: Internal Medicine

## 2020-05-07 ENCOUNTER — Encounter: Payer: Self-pay | Admitting: Internal Medicine

## 2020-05-07 VITALS — BP 114/76 | HR 87 | Temp 98.5°F | Ht 63.6 in | Wt 232.6 lb

## 2020-05-07 DIAGNOSIS — N1831 Chronic kidney disease, stage 3a: Secondary | ICD-10-CM

## 2020-05-07 DIAGNOSIS — J432 Centrilobular emphysema: Secondary | ICD-10-CM | POA: Diagnosis not present

## 2020-05-07 DIAGNOSIS — I700 Atherosclerosis of aorta: Secondary | ICD-10-CM | POA: Diagnosis not present

## 2020-05-07 DIAGNOSIS — L309 Dermatitis, unspecified: Secondary | ICD-10-CM

## 2020-05-07 DIAGNOSIS — Z6841 Body Mass Index (BMI) 40.0 and over, adult: Secondary | ICD-10-CM

## 2020-05-07 DIAGNOSIS — F17211 Nicotine dependence, cigarettes, in remission: Secondary | ICD-10-CM | POA: Diagnosis not present

## 2020-05-07 DIAGNOSIS — R0609 Other forms of dyspnea: Secondary | ICD-10-CM

## 2020-05-07 DIAGNOSIS — R0600 Dyspnea, unspecified: Secondary | ICD-10-CM | POA: Diagnosis not present

## 2020-05-07 DIAGNOSIS — I129 Hypertensive chronic kidney disease with stage 1 through stage 4 chronic kidney disease, or unspecified chronic kidney disease: Secondary | ICD-10-CM | POA: Diagnosis not present

## 2020-05-07 MED ORDER — MOMETASONE FUROATE 0.1 % EX CREA: TOPICAL_CREAM | CUTANEOUS | 1 refills | Status: AC

## 2020-05-07 NOTE — Progress Notes (Signed)
I,Katawbba Wiggins,acting as a Education administrator for [name], MD.,have documented all relevant documentation on the behalf of [name], MD,as directed by  [name], MD while in the presence of [name], MD.  This visit occurred during the SARS-CoV-2 public health emergency.  Safety protocols were in place, including screening questions prior to the visit, additional usage of staff PPE, and extensive cleaning of exam room while observing appropriate contact time as indicated for disinfecting solutions.  Subjective:     Patient ID: Vanessa Ward , female    DOB: 1945/11/16 , 75 y.o.   MRN: [id number]   Chief Complaint  Patient presents with  . Hypertension    HPI  She presents today for BP check.  She reports compliance with meds. Denies headaches, chest pain and palpitaitons. However, admits she is not getting a lot of exercise. She does admit to having SOB w/ exertion. This is nothing new. This has gradually worsened over the past several months. She has smoked in the past, but states she is no longer smoking. She plans to start a regular exercise routine in the near future.   Hypertension This is a chronic problem. The current episode started more than 1 year ago. The problem has been gradually improving since onset. The problem is controlled. Pertinent negatives include no blurred vision or palpitations. Risk factors for coronary artery disease include dyslipidemia, obesity, post-menopausal state and sedentary lifestyle. Past treatments include ACE inhibitors and diuretics. The current treatment provides moderate improvement. Compliance problems include exercise.  Hypertensive end-organ damage includes kidney disease.     Past Medical History:  Diagnosis Date  . Arthritis   . Chronic kidney disease    resent diagnosis of hematuria due to benigh kidney tumor  . GERD (gastroesophageal reflux disease)   . Hypertension   . Shingles      Family History   Problem Relation Age of Onset  . Hypertension Mother   . Heart disease Mother   . Hypertension Father   . Diabetes Paternal Grandmother      Current Outpatient Medications:  .  aspirin EC 81 MG tablet, Take 81 mg by mouth daily., Disp: , Rfl:  .  Cholecalciferol (DIALYVITE VITAMIN D 5000 PO), Take by mouth., Disp: , Rfl:  .  lisinopril-hydrochlorothiazide (ZESTORETIC) 20-25 MG tablet, Take 1 tablet by mouth once daily, Disp: 90 tablet, Rfl: 0 .  mometasone (ELOCON) 0.1 % cream, Apply to affected area daily, Disp: 45 g, Rfl: 1 .  omeprazole (PRILOSEC) 40 MG capsule, Take 1 capsule by mouth once daily, Disp: 90 capsule, Rfl: 0 .  rosuvastatin (CRESTOR) 10 MG tablet, Take 1 tablet by mouth once daily, Disp: 90 tablet, Rfl: 0 .  albuterol (VENTOLIN HFA) 108 (90 Base) MCG/ACT inhaler, Inhale 2 puffs into the lungs every 6 (six) hours as needed for wheezing or shortness of breath. (Patient not taking: Reported on 05/07/2020), Disp: 18 g, Rfl: 2 .  mometasone (NASONEX) 50 MCG/ACT nasal spray, Place 2 sprays into the nose daily. (Patient not taking: Reported on 05/07/2020), Disp: 17 g, Rfl: 1   Allergies  Allergen Reactions  . Other Rash    Perfume, scented lotion     Review of Systems  Constitutional: Negative.   Eyes: Negative for blurred vision.  Cardiovascular: Negative.  Negative for palpitations.  Gastrointestinal: Negative.   Skin: Positive for rash.  Psychiatric/Behavioral: Negative.   All other systems reviewed and are negative.    Today's Vitals  05/07/20 1407  BP: 114/76  Pulse: 87  Temp: 98.5 F (36.9 C)  TempSrc: Oral  Weight: 232 lb 9.6 oz (105.5 kg)  Height: 5' 3.6" (1.615 m)  PainSc: 0-No pain   Body mass index is 40.43 kg/m.  Wt Readings from Last 3 Encounters:  05/07/20 232 lb 9.6 oz (105.5 kg)  01/15/20 235 lb (106.6 kg)  01/03/20 235 lb (106.6 kg)   Objective:  Physical Exam Vitals and nursing note reviewed.  Constitutional:      Appearance: Normal  appearance. She is obese.  HENT:     Head: Normocephalic and atraumatic.     Nose:     Comments: Masked     Mouth/Throat:     Comments: Masked  Eyes:     Extraocular Movements: Extraocular movements intact.  Cardiovascular:     Rate and Rhythm: Normal rate and regular rhythm.     Heart sounds: Normal heart sounds.  Pulmonary:     Effort: Pulmonary effort is normal.     Breath sounds: Normal breath sounds.  Musculoskeletal:     Cervical back: Normal range of motion.  Skin:    General: Skin is warm.     Comments: Hyperkeratotic, hyperpigmented scaly rash on anterior lower leg, no vesicles noted  Neurological:     General: No focal deficit present.     Mental Status: She is alert.  Psychiatric:        Mood and Affect: Mood normal.        Behavior: Behavior normal.         Assessment And Plan:     1. Hypertensive nephropathy Comments: Chroinc, well controlled. Advised to follow low sodium diet. I will check renal function today.  She will f/u in six months.  - CMP14+EGFR  2. Stage 3a chronic kidney disease ([place]) Comments: I will check renal labs as listed below. Advised to stay well hydrated and keep BP well controlled to help prevent progression of CKD.  - Protein electrophoresis, serum - Parathyroid Hormone, Intact w/Ca - Phosphorus  3. Dyspnea on exertion Comments: Low dose CT scan results reviewed. It shows emphysema as well as Ao plaque. I will refer her to Cardiology and for PFTs. Will send rx albuterol prn. - Ambulatory referral to Cardiology - Pulmonary function test; Future  4. Centrilobular emphysema ([place]) Comments: Finding noted on low dose CT scan. I will refer her to Pulmonary for PFTs. I will also send rx albuterol inhaler to use prn.  - Pulmonary function test; Future  5. Atherosclerosis of aorta ([facility]) Chronic, encouraged to follow heart healthy lifestyle. Clean eating, regular exercise and stress management are all a part of this lifestyle. She is also  encouraged to continue with statin therapy. LDL 85 May 2021, goal is less than 70 given her risk factors and known Ao plaque. Will recheck later this year.   - Ambulatory referral to Cardiology  6. Eczema, unspecified type Comments: She states she has used rx creams in the past. She can't recall the name. I will send rx mometasone cream to affected area bid prn.  7. Cigarette nicotine dependence in remission Comments: She agrees to repeat low dose CT scan in June 2022. She is congratulated for maintaining her abstinence from cigarettes. - CT CHEST LUNG CA SCREEN LOW DOSE W/O CM; Future  8. Class 3 severe obesity due to excess calories with serious comorbidity and body mass index (BMI) of 40.0 to 44.9 in adult [facility]) Comments: BMi 40. She is encouraged  to initially strive for BMI less than 35 to decrease cardiac risk. Advised to aim for at least 150 minutes of exercise per week.   Patient was given opportunity to ask questions. Patient verbalized understanding of the plan and was able to repeat key elements of the plan. All questions were answered to their satisfaction.   I, [name], MD, have reviewed all documentation for this visit. The documentation on 05/07/20 for the exam, diagnosis, procedures, and orders are all accurate and complete.   IF YOU HAVE BEEN REFERRED TO A SPECIALIST, IT MAY TAKE 1-2 WEEKS TO SCHEDULE/PROCESS THE REFERRAL. IF YOU HAVE NOT HEARD FROM US/SPECIALIST IN TWO WEEKS, PLEASE GIVE [place] A CALL AT [phone].   THE PATIENT IS ENCOURAGED TO PRACTICE SOCIAL DISTANCING DUE TO THE COVID-19 PANDEMIC.

## 2020-05-07 NOTE — Patient Instructions (Signed)

## 2020-05-09 LAB — CMP14+EGFR
ALT: 17 IU/L (ref 0–32)
AST: 19 IU/L (ref 0–40)
Albumin/Globulin Ratio: 1.5 (ref 1.2–2.2)
Albumin: 4.4 g/dL (ref 3.7–4.7)
Alkaline Phosphatase: 62 IU/L (ref 44–121)
BUN/Creatinine Ratio: 23 (ref 12–28)
BUN: 24 mg/dL (ref 8–27)
Bilirubin Total: 0.6 mg/dL (ref 0.0–1.2)
CO2: 21 mmol/L (ref 20–29)
Calcium: 9.4 mg/dL (ref 8.7–10.3)
Chloride: 103 mmol/L (ref 96–106)
Creatinine, Ser: 1.04 mg/dL — ABNORMAL HIGH (ref 0.57–1.00)
Globulin, Total: 3 g/dL (ref 1.5–4.5)
Glucose: 109 mg/dL — ABNORMAL HIGH (ref 65–99)
Potassium: 4.2 mmol/L (ref 3.5–5.2)
Sodium: 141 mmol/L (ref 134–144)
Total Protein: 7.4 g/dL (ref 6.0–8.5)
eGFR: 57 mL/min/{1.73_m2} — ABNORMAL LOW (ref >59)

## 2020-05-09 LAB — PROTEIN ELECTROPHORESIS, SERUM
A/G Ratio: 1.1 (ref 0.7–1.7)
Albumin ELP: 3.8 g/dL (ref 2.9–4.4)
Alpha 1: 0.3 g/dL (ref 0.0–0.4)
Alpha 2: 0.8 g/dL (ref 0.4–1.0)
Beta: 1.3 g/dL (ref 0.7–1.3)
Gamma Globulin: 1.2 g/dL (ref 0.4–1.8)
Globulin, Total: 3.6 g/dL (ref 2.2–3.9)
M-Spike, %: 0.4 g/dL — ABNORMAL HIGH

## 2020-05-09 LAB — PTH, INTACT AND CALCIUM: PTH: 29 pg/mL (ref 15–65)

## 2020-05-09 LAB — PHOSPHORUS: Phosphorus: 3.2 mg/dL (ref 3.0–4.3)

## 2020-05-15 ENCOUNTER — Other Ambulatory Visit: Payer: Self-pay

## 2020-05-15 ENCOUNTER — Other Ambulatory Visit: Payer: Self-pay | Admitting: Internal Medicine

## 2020-05-15 DIAGNOSIS — N1831 Chronic kidney disease, stage 3a: Secondary | ICD-10-CM

## 2020-05-16 ENCOUNTER — Other Ambulatory Visit: Payer: Self-pay

## 2020-05-16 ENCOUNTER — Telehealth: Payer: Self-pay | Admitting: Hematology

## 2020-05-16 DIAGNOSIS — D472 Monoclonal gammopathy: Secondary | ICD-10-CM

## 2020-05-16 NOTE — Telephone Encounter (Signed)
Received a new hem referral from Dr. Baird Cancer for Monoclonal paraproteinemia. Ms. Vanessa Ward has been cld and scheduled to see Dr. Irene Limbo on 4/13 at 1pm. Pt has requested for her appts to be made on Wednesdays or Thursdays. She was unable to come in on 4/6 and 4/7.

## 2020-05-16 NOTE — Addendum Note (Signed)
Addended by: [name] on: 05/16/2020 10:32 AM   Modules accepted: Orders

## 2020-05-16 NOTE — Addendum Note (Signed)
Addended by: Michelle Nasuti on: 05/16/2020 10:19 AM   Modules accepted: Orders

## 2020-05-27 NOTE — Progress Notes (Signed)
HEMATOLOGY/ONCOLOGY CONSULTATION NOTE  Date of Service: 05/28/2020  Patient Care Team: Glendale Chard, MD as PCP - General (Internal Medicine)  CHIEF COMPLAINTS/PURPOSE OF CONSULTATION:  Monoclonal Paraproteinemia  HISTORY OF PRESENTING ILLNESS:   Vanessa Ward is a wonderful 75 y.o. female who has been referred to [place] by Dr. Glendale Chard, MD for evaluation and management of monoclonal paraproteinemia. The pt reports that she is doing well overall.  The pt reports that she was referred here by her PCP but is unsure of why. She was not given any reasons for being here except for labwork. The pt notes that she has a history of a benign neoplasm in her left kidney that is being seen by [facility] for several years now. The pt notes that she has another renal ultrasound tomorrow. The pt notes that she used to smoke, but has stopped. The pt also notes that in 2020 her PCP found plaque in her heart area and due to COVID never got a stress test or lung function testing. The pt notes she was also referred to Cardiology for stress test coming up soon. The pt notes a difficulty sleeping.  Lab results 05/07/2020 Protein electrophoresis showed m-spike of 0.4.  On review of systems, pt reports SOB and denies sudden weight changes, fevers, chills, night sweats, bone pains, back pain, abdominal pain, and any other symptoms.  MEDICAL HISTORY:  Past Medical History:  Diagnosis Date  . Arthritis   . Chronic kidney disease    resent diagnosis of hematuria due to benigh kidney tumor  . GERD (gastroesophageal reflux disease)   . Hypertension   . Shingles     SURGICAL HISTORY: Past Surgical History:  Procedure Laterality Date  . BIOPSY  10/15/2011   Procedure: BIOPSY;  Surgeon: Lahoma Crocker, MD;  Location: [facility];  Service: Gynecology;;  Cervical  . CERVICAL CONIZATION W/BX  12/17/2011   Procedure: CONIZATION CERVIX WITH BIOPSY;  Surgeon: Lahoma Crocker, MD;  Location: [facility];   Service: Gynecology;  Laterality: N/A;  . COLPOSCOPY  10/15/2011   Procedure: COLPOSCOPY;  Surgeon: Lahoma Crocker, MD;  Location: [facility];  Service: Gynecology;  Laterality: N/A;  . DIAGNOSTIC LAPAROSCOPY    . TUBAL LIGATION      SOCIAL HISTORY: Social History   Socioeconomic History  . Marital status: Single    Spouse name: Not on file  . Number of children: Not on file  . Years of education: Not on file  . Highest education level: Not on file  Occupational History  . Not on file  Tobacco Use  . Smoking status: Former Smoker    Packs/day: 0.50    Types: Cigarettes, Pipe    Start date: 28    Quit date: 2016    Years since quitting: 6.2  . Smokeless tobacco: Never Used  Vaping Use  . Vaping Use: Never used  Substance and Sexual Activity  . Alcohol use: Yes    Alcohol/week: 2.0 standard drinks    Types: 2 Standard drinks or equivalent per week  . Drug use: No  . Sexual activity: Not Currently    Birth control/protection: Post-menopausal  Other Topics Concern  . Not on file  Social History Narrative  . Not on file   Social Determinants of Health   Financial Resource Strain: Low Risk   . Difficulty of Paying Living Expenses: Not hard at all  Food Insecurity: No Food Insecurity  . Worried About Charity fundraiser in the Last Year:  Never true  . Ran Out of Food in the Last Year: Never true  Transportation Needs: No Transportation Needs  . Lack of Transportation (Medical): No  . Lack of Transportation (Non-Medical): No  Physical Activity: Inactive  . Days of Exercise per Week: 0 days  . Minutes of Exercise per Session: 0 min  Stress: No Stress Concern Present  . Feeling of Stress : Not at all  Social Connections: Not on file  Intimate Partner Violence: Not on file    FAMILY HISTORY: Family History  Problem Relation Age of Onset  . Hypertension Mother   . Heart disease Mother   . Hypertension Father   . Diabetes Paternal Grandmother     ALLERGIES:  is  allergic to other.  MEDICATIONS:  Current Outpatient Medications  Medication Sig Dispense Refill  . aspirin EC 81 MG tablet Take 81 mg by mouth daily.    . Cholecalciferol (DIALYVITE VITAMIN D 5000 PO) Take by mouth.    Mar[place] Kitchen lisinopril-hydrochlorothiazide (ZESTORETIC) 20-25 MG tablet Take 1 tablet by mouth once daily 90 tablet 0  . mometasone (ELOCON) 0.1 % cream Apply to affected area daily 45 g 1  . omeprazole (PRILOSEC) 40 MG capsule Take 1 capsule by mouth once daily 90 capsule 0  . rosuvastatin (CRESTOR) 10 MG tablet Take 1 tablet by mouth once daily 90 tablet 0  . albuterol (VENTOLIN HFA) 108 (90 Base) MCG/ACT inhaler Inhale 2 puffs into the lungs every 6 (six) hours as needed for wheezing or shortness of breath. 18 g 2  . mometasone (NASONEX) 50 MCG/ACT nasal spray Place 2 sprays into the nose daily. 17 g 1   No current facility-administered medications for this visit.    REVIEW OF SYSTEMS:   10 Point review of Systems was done is negative except as noted above.  PHYSICAL EXAMINATION: ECOG PERFORMANCE STATUS: 1 - Symptomatic but completely ambulatory  . Vitals:   05/28/20 1308  BP: 104/72  Pulse: 100  Resp: 18  Temp: 98.6 F (37 C)  SpO2: 100%   Filed Weights   05/28/20 1308  Weight: 229 lb 3.2 oz (104 kg)   .Body mass index is 39.84 kg/m.   GENERAL:alert, in no acute distress and comfortable SKIN: no acute rashes, no significant lesions EYES: conjunctiva are pink and non-injected, sclera anicteric OROPHARYNX: MMM, no exudates, no oropharyngeal erythema or ulceration NECK: supple, no JVD LYMPH:  no palpable lymphadenopathy in the cervical, axillary or inguinal regions LUNGS: clear to auscultation b/l with normal respiratory effort HEART: regular rate & rhythm ABDOMEN:  normoactive bowel sounds , non tender, not distended. Extremity: no pedal edema PSYCH: alert & oriented x 3 with fluent speech NEURO: no focal motor/sensory deficits  LABORATORY DATA:  I have  reviewed the data as listed  . CBC Latest Ref Rng & Units 01/03/2020 12/21/2017 12/13/2011  WBC 3.4 - 10.8 x10E3/uL 8.6 7.0 7.1  Hemoglobin 11.1 - 15.9 g/dL 12.0 11.4 11.2(L)  Hematocrit 34.0 - 46.6 % 35.7 35.8 35.3(L)  Platelets 150 - 450 x10E3/uL 228 303 275    . CMP Latest Ref Rng & Units 05/07/2020 01/03/2020 07/09/2019  Glucose 65 - 99 mg/dL 109(H) 87 94  BUN 8 - 27 mg/dL '24 22 22  ' Creatinine 0.57 - 1.00 mg/dL 1.04(H) 1.11(H) 1.10(H)  Sodium 134 - 144 mmol/L 141 142 140  Potassium 3.5 - 5.2 mmol/L 4.2 3.8 4.2  Chloride 96 - 106 mmol/L 103 103 101  CO2 20 - 29 mmol/L 21 25 26  Calcium 8.7 - 10.3 mg/dL 9.4 8.8 9.0  Total Protein 6.0 - 8.5 g/dL 7.4 6.8 7.2  Total Bilirubin 0.0 - 1.2 mg/dL 0.6 0.4 0.5  Alkaline Phos 44 - 121 IU/L 62 57 64  AST 0 - 40 IU/L '19 24 21  ' ALT 0 - 32 IU/L '17 16 16     ' RADIOGRAPHIC STUDIES: I have personally reviewed the radiological images as listed and agreed with the findings in the report. No results found.  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 Advised pt that she has a very small m-spike of 0.4 that tells [place] one antibody is more than others. -Advised pt that 3-4% of people above 63 years old have a small m-spike that is normal and non-conerning. This is most likely the cause for the pt at this time. -Advised pt that Multiple Myeloma is more associated with altered blood counts and chemistries, as well as a larger m-spike. -Discussed CRAB criteria: no hypercalcemia, no anemia, no renal disorder, bone lesions unknown. -Discussed options for further evaluation: blood tests, 24 hr UPEP, PET scan, Bm Bx. -Advised pt that we can either: get blood tests in 4 months to observe for any changes in m-protein or get the blood tests today and 24 hr UPEP and schedule the whole body x-ray. The pt wishes to come back in 4  months. -Will see back in 4 months with labs 1 week prior.    FOLLOW UP: -RTC with Dr Irene Limbo with labs in 4 months --plz schedule these labs 1 week prior to clinic appointment.   All of the patients questions were answered with apparent satisfaction. The patient knows to call the clinic with any problems, questions or concerns.  I spent 40 minutes counseling the patient face to face. The total time spent in the appointment was 45 minutes and more than 50% was on counseling and direct patient cares.    Sullivan Lone MD [place] AAHIVMS [facility] Hematology/Oncology Physician [facility]  (Office):       [phone] (Work cell):  [phone] (Fax):           [phone]  05/28/2020 2:24 PM  I, Reinaldo Raddle, am acting as scribe for Dr. Sullivan Lone, MD.  .I have reviewed the above documentation for accuracy and completeness, and I agree with the above. Brunetta Genera MD

## 2020-05-28 ENCOUNTER — Other Ambulatory Visit: Payer: Self-pay

## 2020-05-28 ENCOUNTER — Inpatient Hospital Stay: Payer: Medicare Other | Attending: Hematology | Admitting: Hematology

## 2020-05-28 VITALS — BP 104/72 | HR 100 | Temp 98.6°F | Resp 18 | Wt 229.2 lb

## 2020-05-28 DIAGNOSIS — D472 Monoclonal gammopathy: Secondary | ICD-10-CM | POA: Insufficient documentation

## 2020-05-28 DIAGNOSIS — Z8249 Family history of ischemic heart disease and other diseases of the circulatory system: Secondary | ICD-10-CM | POA: Diagnosis not present

## 2020-05-28 DIAGNOSIS — N189 Chronic kidney disease, unspecified: Secondary | ICD-10-CM | POA: Diagnosis not present

## 2020-05-28 DIAGNOSIS — Z7951 Long term (current) use of inhaled steroids: Secondary | ICD-10-CM | POA: Diagnosis not present

## 2020-05-28 DIAGNOSIS — Z87891 Personal history of nicotine dependence: Secondary | ICD-10-CM | POA: Insufficient documentation

## 2020-05-28 DIAGNOSIS — Z79899 Other long term (current) drug therapy: Secondary | ICD-10-CM | POA: Insufficient documentation

## 2020-05-28 DIAGNOSIS — I10 Essential (primary) hypertension: Secondary | ICD-10-CM | POA: Diagnosis not present

## 2020-05-28 DIAGNOSIS — Z7982 Long term (current) use of aspirin: Secondary | ICD-10-CM | POA: Insufficient documentation

## 2020-05-28 DIAGNOSIS — Z833 Family history of diabetes mellitus: Secondary | ICD-10-CM | POA: Insufficient documentation

## 2020-05-29 ENCOUNTER — Ambulatory Visit
Admission: RE | Admit: 2020-05-29 | Discharge: 2020-05-29 | Disposition: A | Discharge status: Home / Self Care | Payer: Medicare Other | Source: Ambulatory Visit | Attending: Internal Medicine | Admitting: Internal Medicine

## 2020-05-29 ENCOUNTER — Telehealth: Payer: Self-pay | Admitting: Hematology

## 2020-05-29 DIAGNOSIS — N2889 Other specified disorders of kidney and ureter: Secondary | ICD-10-CM | POA: Diagnosis not present

## 2020-05-29 DIAGNOSIS — N1831 Chronic kidney disease, stage 3a: Secondary | ICD-10-CM

## 2020-05-29 NOTE — Telephone Encounter (Signed)
Scheduled appt per 4/13 los- mailed letter with appt date and time

## 2020-05-31 ENCOUNTER — Encounter: Payer: Self-pay | Admitting: Internal Medicine

## 2020-06-02 ENCOUNTER — Other Ambulatory Visit: Payer: Self-pay | Admitting: Internal Medicine

## 2020-06-11 ENCOUNTER — Ambulatory Visit: Payer: Medicare Other | Admitting: Cardiology

## 2020-07-07 ENCOUNTER — Other Ambulatory Visit: Payer: Self-pay | Admitting: Internal Medicine

## 2020-07-15 ENCOUNTER — Other Ambulatory Visit: Payer: Self-pay | Admitting: Internal Medicine

## 2020-07-15 DIAGNOSIS — K219 Gastro-esophageal reflux disease without esophagitis: Secondary | ICD-10-CM

## 2020-07-16 ENCOUNTER — Ambulatory Visit: Payer: Medicare Other

## 2020-07-22 NOTE — Progress Notes (Signed)
Cardiology Office Note:    Date:  08/14/2020   ID:  Vanessa Ward, DOB 05/11/1945, MRN [id number]  PCP:  Glendale Chard, MD  Cardiologist:  Buford Dresser, MD  Referring MD: Glendale Chard, MD   CC: new patient evaluation  History of Present Illness:    Vanessa Ward is a 75 y.o. female with a hx of arthritis, chronic kidney disease, GERD, and hypertension who is seen as a new consult at the request of Glendale Chard, MD for the evaluation and management of Atherosclerosis of aorta ([place]) and dyspnea on exertion.  Today:  Cardiovascular risk factors: Prior clinical ASCVD: None  Comorbid conditions, including hypertension, hyperlipidemia, diabetes, chronic kidney disease: Hypertension (controlled with medication since her 38's), Hyperlipidemia (controlled since her 10's), Notes being borderline diabetes Metabolic syndrome/Obesity: Highest adult weight is current weight 231 Chronic inflammatory conditions: none Tobacco use history: Former smoker Family history: Hypertension, Diabetes Prior cardiac testing and/or incidental findings on other testing (ie coronary calcium): Exercise level: Housework, cleaning. No formal exercise. Current diet: Prefers fried foods, will eat dietary foods and normal foods  She states her main complaint today is within the past year (end of last summer) she has been short of breath. Her shortness of breath is bothersome, but does not limit or stop her activity. Occasionally when she is pushing her grandchild in a stroller or walking to a neighbor's hourse, she will develop this shortness of breath quickly. She describes her shortness of breath as "when she first gets started, her breathing seems labored" but she is able to push herself and her breathing resolves without resting. She does not note this discomfort when at rest.  She remains compliant with rosuvastatin with no complications.  For 8-10 years she has had a growth on her kidney that  is being followed. This was discovered after hematuria was found with a urine test, and she was smoking at the time. No hematuria at this time.  She denies any chest pain, palpitations, or exertional symptoms. No headaches, lightheadedness, or syncope to report. Also has no lower extremity edema, orthopnea or PND.   Past Medical History:  Diagnosis Date   Arthritis    Chronic kidney disease    resent diagnosis of hematuria due to benigh kidney tumor   GERD (gastroesophageal reflux disease)    Hypertension    Shingles     Past Surgical History:  Procedure Laterality Date   BIOPSY  10/15/2011   Procedure: BIOPSY;  Surgeon: Lahoma Crocker, MD;  Location: [facility];  Service: Gynecology;;  Cervical   CERVICAL CONIZATION W/BX  12/17/2011   Procedure: CONIZATION CERVIX WITH BIOPSY;  Surgeon: Lahoma Crocker, MD;  Location: [facility];  Service: Gynecology;  Laterality: N/A;   COLPOSCOPY  10/15/2011   Procedure: COLPOSCOPY;  Surgeon: Lahoma Crocker, MD;  Location: [facility];  Service: Gynecology;  Laterality: N/A;   DIAGNOSTIC LAPAROSCOPY     TUBAL LIGATION      Current Medications: Current Outpatient Medications on File Prior to Visit  Medication Sig   aspirin EC 81 MG tablet Take 81 mg by mouth daily.   Cholecalciferol (DIALYVITE VITAMIN D 5000 PO) Take by mouth.   lisinopril-hydrochlorothiazide (ZESTORETIC) 20-25 MG tablet Take 1 tablet by mouth once daily   mometasone (ELOCON) 0.1 % cream Apply to affected area daily   omeprazole (PRILOSEC) 40 MG capsule Take 1 capsule by mouth once daily   rosuvastatin (CRESTOR) 10 MG tablet Take 1 tablet by mouth once daily   No  current facility-administered medications on file prior to visit.     Allergies:   Other   Social History   Tobacco Use   Smoking status: Former    Packs/day: 0.50    Pack years: 0.00    Types: Cigarettes, Pipe    Start date: 38    Quit date: 2016    Years since quitting: 6.4   Smokeless tobacco: Never   Vaping Use   Vaping Use: Never used  Substance Use Topics   Alcohol use: Yes    Alcohol/week: 2.0 standard drinks    Types: 2 Standard drinks or equivalent per week   Drug use: No    Family History: family history includes Diabetes in her paternal grandmother; Heart disease in her mother; Hypertension in her father and mother.  ROS:   Please see the history of present illness.  Additional pertinent ROS: Constitutional: Negative for chills, fever, night sweats, unintentional weight loss  HENT: Negative for ear pain and hearing loss.   Eyes: Negative for loss of vision and eye pain.  Respiratory: Positive for shortness of breath. Negative for cough, sputum, wheezing.   Cardiovascular: See HPI. Gastrointestinal: Negative for abdominal pain, melena, and hematochezia.  Genitourinary: Negative for dysuria and hematuria.  Musculoskeletal: Negative for falls and myalgias.  Skin: Negative for itching and rash.  Neurological: Negative for focal weakness, focal sensory changes and loss of consciousness.  Endo/Heme/Allergies: Does not bruise/bleed easily.     EKGs/Labs/Other Studies Reviewed:    The following studies were reviewed today: No prior cardiac studies available.  EKG:  EKG is personally reviewed.   07/23/2020: normal sinus rhythm with sinus arrhythmia, 71 bpm  Recent Labs: 01/03/2020: Hemoglobin 12.0; Platelets 228; TSH 0.830 05/07/2020: ALT 17; BUN 24; Creatinine, Ser 1.04; Potassium 4.2; Sodium 141  Recent Lipid Panel    Component Value Date/Time   CHOL 168 07/09/2019 1133   TRIG 115 07/09/2019 1133   HDL 63 07/09/2019 1133   CHOLHDL 2.7 07/09/2019 1133   LDLCALC 85 07/09/2019 1133    Physical Exam:    VS:  Ht 5\' 4"  (1.626 m)   Wt 231 lb (104.8 kg)   SpO2 93%   BMI 39.65 kg/m   BP 128/86  Wt Readings from Last 3 Encounters:  07/23/20 231 lb (104.8 kg)  05/28/20 229 lb 3.2 oz (104 kg)  05/07/20 232 lb 9.6 oz (105.5 kg)    GEN: Well nourished, well developed  in no acute distress HEENT: Normal, moist mucous membranes NECK: No JVD CARDIAC: regular rhythm, normal S1 and S2, no rubs or gallops. No murmur. VASCULAR: Radial and DP pulses 2+ bilaterally. No carotid bruits RESPIRATORY:  Clear to auscultation without rales, wheezing or rhonchi  ABDOMEN: Soft, non-tender, non-distended MUSCULOSKELETAL:  Ambulates independently SKIN: Warm and dry, no edema NEUROLOGIC:  Alert and oriented x 3. No focal neuro deficits noted. PSYCHIATRIC:  Normal affect    ASSESSMENT:    1. Aortic atherosclerosis ([place])   2. Essential hypertension   3. Dyspnea on exertion   4. Cardiac risk counseling   5. Counseling on health promotion and disease prevention    PLAN:    Dyspnea on exertion: -discussed treadmill stress, nuclear stress/lexiscan, and CT coronary angiography. Discussed pros and cons of each, including but not limited to false positive/false negative risk, radiation risk, and risk of IV contrast dye. Based on shared decision making, decision was made to pursue exercise treadmill stress test to evaluate for ischemia -will also order echocardiogram to rule  out structural cardiac issues -was on inhalers in the past, not currently taking. Also discussed that differential includes pulmonary etiology and deconditioning.  Aortic atherosclerosis: -continue aspirin, rosuvastatin  Hypertension: -continue lisinopril-HCTZ  Cardiac risk counseling and prevention recommendations: -recommend heart healthy/Mediterranean diet, with whole grains, fruits, vegetable, fish, lean meats, nuts, and olive oil. Limit salt. -recommend moderate walking, 3-5 times/week for 30-50 minutes each session. Aim for at least 150 minutes.week. Goal should be pace of 3 miles/hours, or walking 1.5 miles in 30 minutes -recommend avoidance of tobacco products. Avoid excess alcohol. -ASCVD risk score: The 10-year ASCVD risk score Mikey Bussing DC Brooke Bonito., et al., 2013) is: 8.9%   Values used to calculate  the score:     Age: 68 years     Sex: Female     Is Non-Hispanic African American: Yes     Diabetic: No     Tobacco smoker: No     Systolic Blood Pressure: 219 mmHg     Is BP treated: Yes     HDL Cholesterol: 63 mg/dL     Total Cholesterol: 168 mg/dL    Plan for follow up: 1 year or sooner as needed, if testing unremarkable  Buford Dresser, MD, PhD, [facility]    Medication Adjustments/Labs and Tests Ordered: Current medicines are reviewed at length with the patient today.  Concerns regarding medicines are outlined above.  Orders Placed This Encounter  Procedures   Cardiac Stress Test: Informed Consent Details: Physician/Practitioner Attestation; Transcribe to consent form and obtain patient signature   EXERCISE TOLERANCE TEST (ETT)   EKG 12-Lead   ECHOCARDIOGRAM COMPLETE   No orders of the defined types were placed in this encounter.   Patient Instructions  Medication Instructions:  Your Physician recommend you continue on your current medication as directed.    *If you need a refill on your cardiac medications before your next appointment, please call your pharmacy*   Lab Work: None ordered today   Testing/Procedures: Your physician has requested that you have an echocardiogram. Echocardiography is a painless test that uses sound waves to create images of your heart. It provides your doctor with information about the size and shape of your heart and how well your heart's chambers and valves are working. This procedure takes approximately one hour. There are no restrictions for this procedure. [place] has requested that you have an exercise tolerance test. For further information please visit [email or web address]. Please also follow instruction sheet, as given. [place] [address]    Follow-Up: At [facility], you and your health needs are our priority.  As part of our continuing  mission to provide you with exceptional heart care, we have created designated Provider Care Teams.  These Care Teams include your primary Cardiologist (physician) and Advanced Practice Providers (APPs -  Physician Assistants and Nurse Practitioners) who all work together to provide you with the care you need, when you need it.  We recommend signing up for the patient portal called "MyChart".  Sign up information is provided on this After Visit Summary.  MyChart is used to connect with patients for Virtual Visits (Telemedicine).  Patients are able to view lab/test results, encounter notes, upcoming appointments, etc.  Non-urgent messages can be sent to your provider as well.   To learn more about what you can do with MyChart, go to [email or web address].    Your next appointment:   1 year(s) @ [facility]  [place], [place] [address]   The format for your next appointment:   In Person  Provider:   Buford Dresser, MD    [place] [facility] [address] [place], [place] [address] Phone:  [phone]        You are scheduled for an Exercise Stress Test  Please arrive 15 minutes prior to your appointment time for registration and insurance purposes.  The test will take approximately 45 minutes to complete.  How to prepare for your Exercise Stress Test: Do bring a list of your current medications with you.  If not listed below, you may take your medications as normal. Do wear comfortable clothes (no dresses or overalls) and walking shoes, tennis shoes preferred (no heels or open toed shoes are allowed) Do Not wear cologne, perfume, aftershave or lotions (deodorant is allowed). Please report to [place], [address] for your test.  If these instructions are not followed, your test will have to be rescheduled.  If you have questions or concerns about your appointment, you can call the Stress Lab at  [phone].  If you cannot keep your appointment, please provide 24 hours notification to the Stress Lab, to avoid a possible $50 charge to your account      I,Mathew Stumpf,acting as a scribe for Buford Dresser, MD.,have documented all relevant documentation on the behalf of Buford Dresser, MD,as directed by  Buford Dresser, MD while in the presence of Buford Dresser, MD.  I, Buford Dresser, MD, have reviewed all documentation for this visit. The documentation on 08/14/20 for the exam, diagnosis, procedures, and orders are all accurate and complete.   Signed, Buford Dresser, MD PhD 08/14/2020 7:54 PM    [place]

## 2020-07-23 ENCOUNTER — Ambulatory Visit (INDEPENDENT_AMBULATORY_CARE_PROVIDER_SITE_OTHER): Payer: Medicare Other | Admitting: Cardiology

## 2020-07-23 ENCOUNTER — Encounter: Payer: Self-pay | Admitting: Cardiology

## 2020-07-23 ENCOUNTER — Other Ambulatory Visit: Payer: Self-pay

## 2020-07-23 VITALS — Ht 64.0 in | Wt 231.0 lb

## 2020-07-23 DIAGNOSIS — I10 Essential (primary) hypertension: Secondary | ICD-10-CM | POA: Diagnosis not present

## 2020-07-23 DIAGNOSIS — I700 Atherosclerosis of aorta: Secondary | ICD-10-CM

## 2020-07-23 DIAGNOSIS — R0609 Other forms of dyspnea: Secondary | ICD-10-CM

## 2020-07-23 DIAGNOSIS — R0600 Dyspnea, unspecified: Secondary | ICD-10-CM | POA: Diagnosis not present

## 2020-07-23 DIAGNOSIS — Z7189 Other specified counseling: Secondary | ICD-10-CM

## 2020-07-23 NOTE — Patient Instructions (Signed)
Medication Instructions:  Your Physician recommend you continue on your current medication as directed.    *If you need a refill on your cardiac medications before your next appointment, please call your pharmacy*   Lab Work: None ordered today   Testing/Procedures: Your physician has requested that you have an echocardiogram. Echocardiography is a painless test that uses sound waves to create images of your heart. It provides your doctor with information about the size and shape of your heart and how well your heart's chambers and valves are working. This procedure takes approximately one hour. There are no restrictions for this procedure. [place] has requested that you have an exercise tolerance test. For further information please visit [email or web address]. Please also follow instruction sheet, as given. [place] [address]    Follow-Up: At [facility], you and your health needs are our priority.  As part of our continuing mission to provide you with exceptional heart care, we have created designated Provider Care Teams.  These Care Teams include your primary Cardiologist (physician) and Advanced Practice Providers (APPs -  Physician Assistants and Nurse Practitioners) who all work together to provide you with the care you need, when you need it.  We recommend signing up for the patient portal called "MyChart".  Sign up information is provided on this After Visit Summary.  MyChart is used to connect with patients for Virtual Visits (Telemedicine).  Patients are able to view lab/test results, encounter notes, upcoming appointments, etc.  Non-urgent messages can be sent to your provider as well.   To learn more about what you can do with MyChart, go to [email or web address].    Your next appointment:   1 year(s) @ [address] [place], [place] [address]   The format for your next appointment:   In Person  Provider:    Buford Dresser, MD    [facility] at [facility] [address] [place], [place] [address] Phone:  [phone]        You are scheduled for an Exercise Stress Test  Please arrive 15 minutes prior to your appointment time for registration and insurance purposes.  The test will take approximately 45 minutes to complete.  How to prepare for your Exercise Stress Test: . Do bring a list of your current medications with you.  If not listed below, you may take your medications as normal. . Do wear comfortable clothes (no dresses or overalls) and walking shoes, tennis shoes preferred (no heels or open toed shoes are allowed) . Do Not wear cologne, perfume, aftershave or lotions (deodorant is allowed). . Please report to [place], [address] for your test.  If these instructions are not followed, your test will have to be rescheduled.  If you have questions or concerns about your appointment, you can call the Stress Lab at [phone].  If you cannot keep your appointment, please provide 24 hours notification to the Stress Lab, to avoid a possible $50 charge to your account

## 2020-07-23 NOTE — Assessment & Plan Note (Signed)
Only with exertion, happens initially and then improves as she continues to walk. Both indoors and outdoors. Notes with pushing great grandchild in the stroller, carrying things. Has noted more in the last year. Has also been more sedentary in the last year.   Reviewed her CT scan. While there is aortic atherosclerosis, there is no clear coronary calcium on the scan. There is comment on mild emphysema.   We spent significant time today reviewing different parts of the cardiovascular system (electrical, vascular, functional, and valvular). We discussed how each of these systems can present with different symptoms. We reviewed that there are different ways we evaluate these symptoms with tests. We reviewed which tests I think are most appropriate given the symptoms, and we discussed risks/benefits and limitations of each of these tests. Please see summary below. We also discussed that if testing is unrevealing for a cardiac cause of the symptoms, there are many noncardiac causes as well that can contribute to symptoms. If the heart is ruled out, then I recommend returning to PCP to discuss alternative diagnoses.  -will get echocardiogram. -we also discussed stressed testing. After shared decision making, will get exercise treadmill test

## 2020-07-30 ENCOUNTER — Ambulatory Visit
Admission: RE | Admit: 2020-07-30 | Discharge: 2020-07-30 | Disposition: A | Discharge status: Home / Self Care | Payer: Medicare Other | Source: Ambulatory Visit | Attending: Internal Medicine | Admitting: Internal Medicine

## 2020-07-30 DIAGNOSIS — Z87891 Personal history of nicotine dependence: Secondary | ICD-10-CM | POA: Diagnosis not present

## 2020-07-30 DIAGNOSIS — F17211 Nicotine dependence, cigarettes, in remission: Secondary | ICD-10-CM

## 2020-08-05 ENCOUNTER — Ambulatory Visit (HOSPITAL_COMMUNITY)
Admission: RE | Admit: 2020-08-05 | Discharge: 2020-08-05 | Disposition: A | Discharge status: Home / Self Care | Payer: Medicare Other | Source: Ambulatory Visit | Attending: Internal Medicine | Admitting: Internal Medicine

## 2020-08-05 ENCOUNTER — Other Ambulatory Visit: Payer: Self-pay

## 2020-08-05 DIAGNOSIS — R0600 Dyspnea, unspecified: Secondary | ICD-10-CM | POA: Diagnosis not present

## 2020-08-05 DIAGNOSIS — R0609 Other forms of dyspnea: Secondary | ICD-10-CM

## 2020-08-05 LAB — EXERCISE TOLERANCE TEST
Estimated workload: 3.9 METS
Exercise duration (min): 1 min
Exercise duration (sec): 34 s
MPHR: 147 {beats}/min
Peak HR: 133 {beats}/min
Percent HR: 90 %
Rest HR: 73 {beats}/min

## 2020-08-14 ENCOUNTER — Encounter: Payer: Self-pay | Admitting: Cardiology

## 2020-08-21 ENCOUNTER — Other Ambulatory Visit: Payer: Self-pay

## 2020-08-21 ENCOUNTER — Ambulatory Visit (HOSPITAL_COMMUNITY): Payer: Medicare Other | Attending: Cardiovascular Disease

## 2020-08-21 DIAGNOSIS — R0600 Dyspnea, unspecified: Secondary | ICD-10-CM | POA: Diagnosis not present

## 2020-08-21 DIAGNOSIS — R0609 Other forms of dyspnea: Secondary | ICD-10-CM

## 2020-08-21 LAB — ECHOCARDIOGRAM COMPLETE
Area-P 1/2: 3.68 cm2
S' Lateral: 2.6 cm

## 2020-08-27 ENCOUNTER — Other Ambulatory Visit: Payer: Self-pay | Admitting: Internal Medicine

## 2020-09-05 ENCOUNTER — Other Ambulatory Visit: Payer: Self-pay | Admitting: Internal Medicine

## 2020-09-05 DIAGNOSIS — Z1231 Encounter for screening mammogram for malignant neoplasm of breast: Secondary | ICD-10-CM

## 2020-09-10 DIAGNOSIS — Z03818 Encounter for observation for suspected exposure to other biological agents ruled out: Secondary | ICD-10-CM | POA: Diagnosis not present

## 2020-09-10 DIAGNOSIS — U071 COVID-19: Secondary | ICD-10-CM | POA: Diagnosis not present

## 2020-09-11 ENCOUNTER — Ambulatory Visit: Payer: Medicare Other | Admitting: Internal Medicine

## 2020-09-11 DIAGNOSIS — U071 COVID-19: Secondary | ICD-10-CM | POA: Diagnosis not present

## 2020-09-22 ENCOUNTER — Other Ambulatory Visit: Payer: Medicare Other

## 2020-09-29 ENCOUNTER — Ambulatory Visit: Payer: Medicare Other | Admitting: Hematology

## 2020-10-02 ENCOUNTER — Other Ambulatory Visit: Payer: Self-pay | Admitting: Internal Medicine

## 2020-10-02 DIAGNOSIS — K219 Gastro-esophageal reflux disease without esophagitis: Secondary | ICD-10-CM

## 2020-10-09 ENCOUNTER — Other Ambulatory Visit: Payer: Self-pay

## 2020-10-09 ENCOUNTER — Encounter: Payer: Self-pay | Admitting: Internal Medicine

## 2020-10-09 ENCOUNTER — Ambulatory Visit (INDEPENDENT_AMBULATORY_CARE_PROVIDER_SITE_OTHER): Payer: Medicare Other | Admitting: Internal Medicine

## 2020-10-09 VITALS — BP 120/60 | HR 76 | Temp 98.3°F | Ht 64.4 in | Wt 227.6 lb

## 2020-10-09 DIAGNOSIS — N1831 Chronic kidney disease, stage 3a: Secondary | ICD-10-CM | POA: Diagnosis not present

## 2020-10-09 DIAGNOSIS — E7800 Pure hypercholesterolemia, unspecified: Secondary | ICD-10-CM | POA: Diagnosis not present

## 2020-10-09 DIAGNOSIS — I700 Atherosclerosis of aorta: Secondary | ICD-10-CM | POA: Diagnosis not present

## 2020-10-09 DIAGNOSIS — I129 Hypertensive chronic kidney disease with stage 1 through stage 4 chronic kidney disease, or unspecified chronic kidney disease: Secondary | ICD-10-CM

## 2020-10-09 DIAGNOSIS — Z8616 Personal history of COVID-19: Secondary | ICD-10-CM

## 2020-10-09 DIAGNOSIS — R7309 Other abnormal glucose: Secondary | ICD-10-CM

## 2020-10-09 DIAGNOSIS — Z6838 Body mass index (BMI) 38.0-38.9, adult: Secondary | ICD-10-CM | POA: Diagnosis not present

## 2020-10-09 DIAGNOSIS — R0982 Postnasal drip: Secondary | ICD-10-CM | POA: Diagnosis not present

## 2020-10-09 DIAGNOSIS — Z23 Encounter for immunization: Secondary | ICD-10-CM | POA: Diagnosis not present

## 2020-10-09 MED ORDER — LORATADINE 10 MG PO TABS: 10.0000 mg | ORAL_TABLET | Freq: Every day | ORAL | 2 refills | Status: DC | PRN

## 2020-10-09 MED ORDER — ZOSTER VAC RECOMB ADJUVANTED 50 MCG/0.5ML IM SUSR: 0.5000 mL | Freq: Once | INTRAMUSCULAR | 0 refills | Status: AC

## 2020-10-09 NOTE — Patient Instructions (Signed)

## 2020-10-09 NOTE — Progress Notes (Signed)
I,Tianna Badgett,acting as a Education administrator for [name], MD.,have documented all relevant documentation on the behalf of [name], MD,as directed by  [name], MD while in the presence of [name], MD.  This visit occurred during the SARS-CoV-2 public health emergency.  Safety protocols were in place, including screening questions prior to the visit, additional usage of staff PPE, and extensive cleaning of exam room while observing appropriate contact time as indicated for disinfecting solutions.  Subjective:     Patient ID: Vanessa Ward , female    DOB: 05/20/1945 , 75 y.o.   MRN: [id number]   Chief Complaint  Patient presents with   Hypertension    HPI  She presents today for BP check.  She reports compliance with meds. Denies headaches, chest pain and palpitaitons. She had Covid in the middle of July and she states that she hasn't felt right since then. She states she got it from her sister who visited from out of town. She had nasal congestion, sinus drainage and fatigue.   Hypertension This is a chronic problem. The current episode started more than 1 year ago. The problem has been gradually improving since onset. The problem is controlled. Pertinent negatives include no blurred vision or palpitations. Risk factors for coronary artery disease include dyslipidemia, obesity, post-menopausal state and sedentary lifestyle. Past treatments include ACE inhibitors and diuretics. The current treatment provides moderate improvement. Compliance problems include exercise.  Hypertensive end-organ damage includes kidney disease.    Past Medical History:  Diagnosis Date   Arthritis    Chronic kidney disease    resent diagnosis of hematuria due to benigh kidney tumor   GERD (gastroesophageal reflux disease)    Hypertension    Shingles      Family History  Problem Relation Age of Onset   Hypertension Mother    Heart disease Mother    Hypertension Father    Diabetes  Paternal Grandmother      Current Outpatient Medications:    aspirin EC 81 MG tablet, Take 81 mg by mouth daily., Disp: , Rfl:    Cholecalciferol (DIALYVITE VITAMIN D 5000 PO), Take by mouth., Disp: , Rfl:    lisinopril-hydrochlorothiazide (ZESTORETIC) 20-25 MG tablet, Take 1 tablet by mouth once daily, Disp: 90 tablet, Rfl: 0   loratadine (CLARITIN) 10 MG tablet, Take 1 tablet (10 mg total) by mouth daily as needed for allergies., Disp: 30 tablet, Rfl: 2   mometasone (ELOCON) 0.1 % cream, Apply to affected area daily, Disp: 45 g, Rfl: 1   omeprazole (PRILOSEC) 40 MG capsule, Take 1 capsule by mouth once daily, Disp: 90 capsule, Rfl: 0   rosuvastatin (CRESTOR) 10 MG tablet, Take 1 tablet by mouth once daily, Disp: 90 tablet, Rfl: 0   Allergies  Allergen Reactions   Other Rash    Perfume, scented lotion     Review of Systems  Constitutional: Negative.   HENT:  Positive for postnasal drip.   Eyes:  Negative for blurred vision.  Respiratory:  Positive for cough.   Cardiovascular: Negative.  Negative for palpitations.  Gastrointestinal: Negative.   Neurological: Negative.     Today's Vitals   10/09/20 0849  BP: 120/60  Pulse: 76  Temp: 98.3 F (36.8 C)  TempSrc: Oral  Weight: 227 lb 9.6 oz (103.2 kg)  Height: 5' 4.4" (1.636 m)   Body mass index is 38.58 kg/m.  Wt Readings from Last 3 Encounters:  10/09/20 227 lb 9.6 oz (103.2 kg)  07/23/20 231 lb (104.8 kg)  05/28/20 229 lb 3.2 oz (104 kg)    Objective:  Physical Exam Vitals and nursing note reviewed.  Constitutional:      Appearance: Normal appearance.  HENT:     Head: Normocephalic and atraumatic.     Nose:     Comments: Masked     Mouth/Throat:     Comments: Masked  Cardiovascular:     Rate and Rhythm: Normal rate and regular rhythm.     Heart sounds: Normal heart sounds.  Pulmonary:     Effort: Pulmonary effort is normal.     Breath sounds: Normal breath sounds.  Musculoskeletal:     Cervical back:  Normal range of motion.  Skin:    General: Skin is warm.  Neurological:     General: No focal deficit present.     Mental Status: She is alert.  Psychiatric:        Mood and Affect: Mood normal.        Behavior: Behavior normal.        Assessment And Plan:     1. Hypertensive nephropathy Comments: Chronic, well controlled. She is encouraged to follow low sodium diet.  I will check renal function today.  She will f/u in six months for re-evaluation.  - CMP14+EGFR - Lipid panel  2. Stage 3a chronic kidney disease ([place]) Comments: Chronic, I will check renal function.  She is encouraged to follow renal function.   3. Pure hypercholesterolemia Comments: We discussed the importance of compliance with statin therapy, she will c/w rosuvastatin.  - Lipid panel  4. Postnasal drip Comments: I will send rx loratadine daily prn. She is also advised to remove dairy from her diet for two weeks.   5. Other abnormal glucose Comments: Her a1c has been elevated in the past. I will recheck this today. Encouraged to limit her intake of sweetened beverages.  - Insulin, random(561) - Hemoglobin A1c  6. Atherosclerosis of aorta ([place]) Comments: Encouraged to follow heart healthy diet - c/w statin therapy.   7. Class 2 severe obesity due to excess calories with serious comorbidity and body mass index (BMI) of 38.0 to 38.9 in adult [facility]) Comments:  She is encouraged to strive for BMI less than 30 to decrease cardiac risk. Advised to aim for at least 150 minutes of exercise per week.  8. Personal history of COVID-19 Comments: She is fully vaccinated and boosted x 1.    Patient was given opportunity to ask questions. Patient verbalized understanding of the plan and was able to repeat key elements of the plan. All questions were answered to their satisfaction.   I, [name], MD, have reviewed all documentation for this visit. The documentation on 10/09/20 for the exam, diagnosis, procedures,  and orders are all accurate and complete.   IF YOU HAVE BEEN REFERRED TO A SPECIALIST, IT MAY TAKE 1-2 WEEKS TO SCHEDULE/PROCESS THE REFERRAL. IF YOU HAVE NOT HEARD FROM US/SPECIALIST IN TWO WEEKS, PLEASE GIVE [place] A CALL AT [phone].   THE PATIENT IS ENCOURAGED TO PRACTICE SOCIAL DISTANCING DUE TO THE COVID-19 PANDEMIC.

## 2020-10-10 LAB — LIPID PANEL
Chol/HDL Ratio: 2.4 ratio (ref 0.0–4.4)
Cholesterol, Total: 144 mg/dL (ref 100–199)
HDL: 61 mg/dL (ref >39)
LDL Chol Calc (NIH): 65 mg/dL (ref 0–99)
Triglycerides: 96 mg/dL (ref 0–149)
VLDL Cholesterol Cal: 18 mg/dL (ref 5–40)

## 2020-10-10 LAB — CMP14+EGFR
ALT: 15 IU/L (ref 0–32)
AST: 17 IU/L (ref 0–40)
Albumin/Globulin Ratio: 1.6 (ref 1.2–2.2)
Albumin: 4.1 g/dL (ref 3.7–4.7)
Alkaline Phosphatase: 63 IU/L (ref 44–121)
BUN/Creatinine Ratio: 21 (ref 12–28)
BUN: 25 mg/dL (ref 8–27)
Bilirubin Total: 0.4 mg/dL (ref 0.0–1.2)
CO2: 23 mmol/L (ref 20–29)
Calcium: 9.4 mg/dL (ref 8.7–10.3)
Chloride: 105 mmol/L (ref 96–106)
Creatinine, Ser: 1.19 mg/dL — ABNORMAL HIGH (ref 0.57–1.00)
Globulin, Total: 2.5 g/dL (ref 1.5–4.5)
Glucose: 90 mg/dL (ref 65–99)
Potassium: 4 mmol/L (ref 3.5–5.2)
Sodium: 143 mmol/L (ref 134–144)
Total Protein: 6.6 g/dL (ref 6.0–8.5)
eGFR: 48 mL/min/{1.73_m2} — ABNORMAL LOW (ref >59)

## 2020-10-10 LAB — INSULIN, RANDOM: INSULIN: 30.5 u[IU]/mL — ABNORMAL HIGH (ref 2.6–24.9)

## 2020-10-10 LAB — HEMOGLOBIN A1C
Est. average glucose Bld gHb Est-mCnc: 126 mg/dL
Hgb A1c MFr Bld: 6 % — ABNORMAL HIGH (ref 4.8–5.6)

## 2020-10-29 ENCOUNTER — Other Ambulatory Visit: Payer: Self-pay

## 2020-10-29 ENCOUNTER — Ambulatory Visit
Admission: RE | Admit: 2020-10-29 | Discharge: 2020-10-29 | Disposition: A | Discharge status: Home / Self Care | Payer: Medicare Other | Source: Ambulatory Visit | Attending: Internal Medicine | Admitting: Internal Medicine

## 2020-10-29 DIAGNOSIS — Z1231 Encounter for screening mammogram for malignant neoplasm of breast: Secondary | ICD-10-CM

## 2020-10-30 DIAGNOSIS — D3002 Benign neoplasm of left kidney: Secondary | ICD-10-CM | POA: Diagnosis not present

## 2020-11-27 ENCOUNTER — Other Ambulatory Visit: Payer: Self-pay | Admitting: Internal Medicine

## 2020-12-31 ENCOUNTER — Other Ambulatory Visit: Payer: Self-pay | Admitting: Internal Medicine

## 2020-12-31 DIAGNOSIS — K219 Gastro-esophageal reflux disease without esophagitis: Secondary | ICD-10-CM

## 2021-01-15 ENCOUNTER — Ambulatory Visit: Payer: Medicare Other

## 2021-01-15 ENCOUNTER — Ambulatory Visit: Payer: Medicare Other | Admitting: Internal Medicine

## 2021-02-02 ENCOUNTER — Other Ambulatory Visit: Payer: Self-pay | Admitting: Internal Medicine

## 2021-03-18 ENCOUNTER — Ambulatory Visit: Payer: Medicare Other

## 2021-03-31 ENCOUNTER — Other Ambulatory Visit: Payer: Self-pay | Admitting: Internal Medicine

## 2021-03-31 DIAGNOSIS — K219 Gastro-esophageal reflux disease without esophagitis: Secondary | ICD-10-CM

## 2021-04-09 DIAGNOSIS — K219 Gastro-esophageal reflux disease without esophagitis: Secondary | ICD-10-CM | POA: Diagnosis not present

## 2021-04-09 DIAGNOSIS — Z1211 Encounter for screening for malignant neoplasm of colon: Secondary | ICD-10-CM | POA: Diagnosis not present

## 2021-04-09 DIAGNOSIS — K5730 Diverticulosis of large intestine without perforation or abscess without bleeding: Secondary | ICD-10-CM | POA: Diagnosis not present

## 2021-04-09 LAB — CBC AND DIFFERENTIAL
HCT: 33 — AB (ref 36–46)
Hemoglobin: 10.6 — AB (ref 12.0–16.0)
Platelets: 286 (ref 150–399)
WBC: 7.3

## 2021-04-09 LAB — CBC: RBC: 4.16 (ref 3.87–5.11)

## 2021-04-13 ENCOUNTER — Encounter: Payer: Self-pay | Admitting: Internal Medicine

## 2021-04-22 ENCOUNTER — Other Ambulatory Visit: Payer: Self-pay

## 2021-04-22 ENCOUNTER — Ambulatory Visit (INDEPENDENT_AMBULATORY_CARE_PROVIDER_SITE_OTHER): Payer: Medicare Other | Admitting: Internal Medicine

## 2021-04-22 ENCOUNTER — Encounter: Payer: Self-pay | Admitting: Internal Medicine

## 2021-04-22 ENCOUNTER — Ambulatory Visit (INDEPENDENT_AMBULATORY_CARE_PROVIDER_SITE_OTHER): Payer: Medicare Other

## 2021-04-22 VITALS — BP 124/70 | HR 87 | Temp 98.0°F | Ht 62.4 in | Wt 228.6 lb

## 2021-04-22 VITALS — BP 124/80 | HR 87 | Temp 98.0°F | Ht 62.4 in | Wt 228.0 lb

## 2021-04-22 DIAGNOSIS — R0602 Shortness of breath: Secondary | ICD-10-CM

## 2021-04-22 DIAGNOSIS — Z79899 Other long term (current) drug therapy: Secondary | ICD-10-CM | POA: Diagnosis not present

## 2021-04-22 DIAGNOSIS — K219 Gastro-esophageal reflux disease without esophagitis: Secondary | ICD-10-CM

## 2021-04-22 DIAGNOSIS — Z0000 Encounter for general adult medical examination without abnormal findings: Secondary | ICD-10-CM | POA: Diagnosis not present

## 2021-04-22 DIAGNOSIS — R7309 Other abnormal glucose: Secondary | ICD-10-CM

## 2021-04-22 DIAGNOSIS — N1831 Chronic kidney disease, stage 3a: Secondary | ICD-10-CM | POA: Diagnosis not present

## 2021-04-22 DIAGNOSIS — I700 Atherosclerosis of aorta: Secondary | ICD-10-CM

## 2021-04-22 DIAGNOSIS — Z6841 Body Mass Index (BMI) 40.0 and over, adult: Secondary | ICD-10-CM | POA: Diagnosis not present

## 2021-04-22 DIAGNOSIS — U099 Post covid-19 condition, unspecified: Secondary | ICD-10-CM

## 2021-04-22 DIAGNOSIS — Z87891 Personal history of nicotine dependence: Secondary | ICD-10-CM

## 2021-04-22 DIAGNOSIS — E7800 Pure hypercholesterolemia, unspecified: Secondary | ICD-10-CM | POA: Diagnosis not present

## 2021-04-22 DIAGNOSIS — I129 Hypertensive chronic kidney disease with stage 1 through stage 4 chronic kidney disease, or unspecified chronic kidney disease: Secondary | ICD-10-CM | POA: Diagnosis not present

## 2021-04-22 MED ORDER — ROSUVASTATIN CALCIUM 10 MG PO TABS: 10.0000 mg | ORAL_TABLET | Freq: Every day | ORAL | 2 refills | Status: DC

## 2021-04-22 MED ORDER — OMEPRAZOLE 40 MG PO CPDR: 40.0000 mg | DELAYED_RELEASE_CAPSULE | Freq: Every day | ORAL | 2 refills | Status: DC

## 2021-04-22 MED ORDER — LISINOPRIL-HYDROCHLOROTHIAZIDE 20-25 MG PO TABS: 1.0000 | ORAL_TABLET | Freq: Every day | ORAL | 2 refills | Status: DC

## 2021-04-22 NOTE — Patient Instructions (Signed)
Vanessa Ward , Thank you for taking time to come for your Medicare Wellness Visit. I appreciate your ongoing commitment to your health goals. Please review the following plan we discussed and let me know if I can assist you in the future.   Screening recommendations/referrals: Colonoscopy: due Mammogram: completed 10/29/2020 Bone Density: completed 12/08/2016 Recommended yearly ophthalmology/optometry visit for glaucoma screening and checkup Recommended yearly dental visit for hygiene and checkup  Vaccinations: Influenza vaccine: completed 10/09/2020, due next flu season Pneumococcal vaccine: due Tdap vaccine: completed 06/12/2012, due 06/13/2022 Shingles vaccine: completed    Covid-19: 12/01/2019, 05/08/2019, 04/12/2019  Advanced directives: Advance directive discussed with you today. Even though you declined this today please call our office should you change your mind and we can give you the proper paperwork for you to fill out.  Conditions/risks identified: none  Next appointment: Follow up in one year for your annual wellness visit    Preventive Care 65 Years and Older, Female Preventive care refers to lifestyle choices and visits with your health care provider that can promote health and wellness. What does preventive care include? A yearly physical exam. This is also called an annual well check. Dental exams once or twice a year. Routine eye exams. Ask your health care provider how often you should have your eyes checked. Personal lifestyle choices, including: Daily care of your teeth and gums. Regular physical activity. Eating a healthy diet. Avoiding tobacco and drug use. Limiting alcohol use. Practicing safe sex. Taking low-dose aspirin every day. Taking vitamin and mineral supplements as recommended by your health care provider. What happens during an annual well check? The services and screenings done by your health care provider during your annual well check will depend  on your age, overall health, lifestyle risk factors, and family history of disease. Counseling  Your health care provider may ask you questions about your: Alcohol use. Tobacco use. Drug use. Emotional well-being. Home and relationship well-being. Sexual activity. Eating habits. History of falls. Memory and ability to understand (cognition). Work and work Statistician. Reproductive health. Screening  You may have the following tests or measurements: Height, weight, and BMI. Blood pressure. Lipid and cholesterol levels. These may be checked every 5 years, or more frequently if you are over 28 years old. Skin check. Lung cancer screening. You may have this screening every year starting at age 1 if you have a 30-pack-year history of smoking and currently smoke or have quit within the past 15 years. Fecal occult blood test (FOBT) of the stool. You may have this test every year starting at age 76. Flexible sigmoidoscopy or colonoscopy. You may have a sigmoidoscopy every 5 years or a colonoscopy every 10 years starting at age 40. Hepatitis C blood test. Hepatitis B blood test. Sexually transmitted disease (STD) testing. Diabetes screening. This is done by checking your blood sugar (glucose) after you have not eaten for a while (fasting). You may have this done every 1-3 years. Bone density scan. This is done to screen for osteoporosis. You may have this done starting at age 33. Mammogram. This may be done every 1-2 years. Talk to your health care provider about how often you should have regular mammograms. Talk with your health care provider about your test results, treatment options, and if necessary, the need for more tests. Vaccines  Your health care provider may recommend certain vaccines, such as: Influenza vaccine. This is recommended every year. Tetanus, diphtheria, and acellular pertussis (Tdap, Td) vaccine. You may need a Td booster  every 10 years. Zoster vaccine. You may need  this after age 30. Pneumococcal 13-valent conjugate (PCV13) vaccine. One dose is recommended after age 57. Pneumococcal polysaccharide (PPSV23) vaccine. One dose is recommended after age 41. Talk to your health care provider about which screenings and vaccines you need and how often you need them. This information is not intended to replace advice given to you by your health care provider. Make sure you discuss any questions you have with your health care provider. Document Released: 02/28/2015 Document Revised: 10/22/2015 Document Reviewed: 12/03/2014 Elsevier Interactive Patient Education  2017 [employer] Prevention in the Home Falls can cause injuries. They can happen to people of all ages. There are many things you can do to make your home safe and to help prevent falls. What can I do on the outside of my home? Regularly fix the edges of walkways and driveways and fix any cracks. Remove anything that might make you trip as you walk through a door, such as a raised step or threshold. Trim any bushes or trees on the path to your home. Use bright outdoor lighting. Clear any walking paths of anything that might make someone trip, such as rocks or tools. Regularly check to see if handrails are loose or broken. Make sure that both sides of any steps have handrails. Any raised decks and porches should have guardrails on the edges. Have any leaves, snow, or ice cleared regularly. Use sand or salt on walking paths during winter. Clean up any spills in your garage right away. This includes oil or grease spills. What can I do in the bathroom? Use night lights. Install grab bars by the toilet and in the tub and shower. Do not use towel bars as grab bars. Use non-skid mats or decals in the tub or shower. If you need to sit down in the shower, use a plastic, non-slip stool. Keep the floor dry. Clean up any water that spills on the floor as soon as it happens. Remove soap buildup in the tub  or shower regularly. Attach bath mats securely with double-sided non-slip rug tape. Do not have throw rugs and other things on the floor that can make you trip. What can I do in the bedroom? Use night lights. Make sure that you have a light by your bed that is easy to reach. Do not use any sheets or blankets that are too big for your bed. They should not hang down onto the floor. Have a firm chair that has side arms. You can use this for support while you get dressed. Do not have throw rugs and other things on the floor that can make you trip. What can I do in the kitchen? Clean up any spills right away. Avoid walking on wet floors. Keep items that you use a lot in easy-to-reach places. If you need to reach something above you, use a strong step stool that has a grab bar. Keep electrical cords out of the way. Do not use floor polish or wax that makes floors slippery. If you must use wax, use non-skid floor wax. Do not have throw rugs and other things on the floor that can make you trip. What can I do with my stairs? Do not leave any items on the stairs. Make sure that there are handrails on both sides of the stairs and use them. Fix handrails that are broken or loose. Make sure that handrails are as long as the stairways. Check any carpeting to  make sure that it is firmly attached to the stairs. Fix any carpet that is loose or worn. Avoid having throw rugs at the top or bottom of the stairs. If you do have throw rugs, attach them to the floor with carpet tape. Make sure that you have a light switch at the top of the stairs and the bottom of the stairs. If you do not have them, ask someone to add them for you. What else can I do to help prevent falls? Wear shoes that: Do not have high heels. Have rubber bottoms. Are comfortable and fit you well. Are closed at the toe. Do not wear sandals. If you use a stepladder: Make sure that it is fully opened. Do not climb a closed stepladder. Make  sure that both sides of the stepladder are locked into place. Ask someone to hold it for you, if possible. Clearly mark and make sure that you can see: Any grab bars or handrails. First and last steps. Where the edge of each step is. Use tools that help you move around (mobility aids) if they are needed. These include: Canes. Walkers. Scooters. Crutches. Turn on the lights when you go into a dark area. Replace any light bulbs as soon as they burn out. Set up your furniture so you have a clear path. Avoid moving your furniture around. If any of your floors are uneven, fix them. If there are any pets around you, be aware of where they are. Review your medicines with your doctor. Some medicines can make you feel dizzy. This can increase your chance of falling. Ask your doctor what other things that you can do to help prevent falls. This information is not intended to replace advice given to you by your health care provider. Make sure you discuss any questions you have with your health care provider. Document Released: 11/28/2008 Document Revised: 07/10/2015 Document Reviewed: 03/08/2014 Elsevier Interactive Patient Education  2017 [employer].

## 2021-04-22 NOTE — Progress Notes (Signed)
This visit occurred during the SARS-CoV-2 public health emergency.  Safety protocols were in place, including screening questions prior to the visit, additional usage of staff PPE, and extensive cleaning of exam room while observing appropriate contact time as indicated for disinfecting solutions.  Subjective:   Vanessa Ward is a 76 y.o. female who presents for Medicare Annual (Subsequent) preventive examination.  Review of Systems     Cardiac Risk Factors include: advanced age (>23mn, >>3women);hypertension;obesity (BMI >30kg/m2)     Objective:    Today's Vitals   04/22/21 1430  BP: 124/70  Pulse: 87  Temp: 98 F (36.7 C)  TempSrc: Oral  SpO2: 96%  Weight: 228 lb 9.6 oz (103.7 kg)  Height: 5' 2.4" (1.585 m)   Body mass index is 41.28 kg/m.  Advanced Directives 04/22/2021 01/03/2020 12/27/2018 12/21/2017 02/06/2014 12/13/2011 10/05/2011  Does Patient Have a Medical Advance Directive? No No No No No Patient does not have advance directive Patient does not have advance directive  Would patient like information on creating a medical advance directive? No - Patient declined No - Patient declined - Yes (MAU/Ambulatory/Procedural Areas - Information given) No - patient declined information - -    Current Medications (verified) Outpatient Encounter Medications as of 04/22/2021  Medication Sig   aspirin EC 81 MG tablet Take 81 mg by mouth daily.   Cholecalciferol (DIALYVITE VITAMIN D 5000 PO) Take by mouth.   loratadine (CLARITIN) 10 MG tablet Take 1 tablet (10 mg total) by mouth daily as needed for allergies.   mometasone (ELOCON) 0.1 % cream Apply to affected area daily   [DISCONTINUED] lisinopril-hydrochlorothiazide (ZESTORETIC) 20-25 MG tablet Take 1 tablet by mouth once daily   [DISCONTINUED] omeprazole (PRILOSEC) 40 MG capsule Take 1 capsule by mouth once daily   [DISCONTINUED] rosuvastatin (CRESTOR) 10 MG tablet Take 1 tablet by mouth once daily   No facility-administered  encounter medications on file as of 04/22/2021.    Allergies (verified) Other   History: Past Medical History:  Diagnosis Date   Arthritis    Chronic kidney disease    resent diagnosis of hematuria due to benigh kidney tumor   GERD (gastroesophageal reflux disease)    Hypertension    Shingles    Past Surgical History:  Procedure Laterality Date   BIOPSY  10/15/2011   Procedure: BIOPSY;  Surgeon: LLahoma Crocker MD;  [facility];  Service: Gynecology;;  Cervical   CERVICAL CONIZATION W/BX  12/17/2011   Procedure: CONIZATION CERVIX WITH BIOPSY;  Surgeon: LLahoma Crocker MD;  [facility];  Service: Gynecology;  Laterality: N/A;   COLPOSCOPY  10/15/2011   Procedure: COLPOSCOPY;  Surgeon: LLahoma Crocker MD;  [facility];  Service: Gynecology;  Laterality: N/A;   DIAGNOSTIC LAPAROSCOPY     TUBAL LIGATION     Family History  Problem Relation Age of Onset   Hypertension Mother    Heart disease Mother    Hypertension Father    Diabetes Paternal Grandmother    Social History   Socioeconomic History   Marital status: Single    Spouse name: Not on file   Number of children: Not on file   Years of education: Not on file   Highest education level: Not on file  Occupational History   Not on file  Tobacco Use   Smoking status: Former    Packs/day: 0.50    Types: Cigarettes, Pipe    Start date: [birth year]   Quit date: 2016    Years  since quitting: 7.1   Smokeless tobacco: Never  Vaping Use   Vaping Use: Never used  Substance and Sexual Activity   Alcohol use: Yes    Alcohol/week: 2.0 standard drinks    Types: 2 Standard drinks or equivalent per week   Drug use: No   Sexual activity: Not Currently    Birth control/protection: Post-menopausal  Other Topics Concern   Not on file  Social History Narrative   Not on file   Social Determinants of Health   Financial Resource Strain: Low Risk    Difficulty of Paying Living Expenses: Not hard at all  Food  Insecurity: No Food Insecurity   Worried About Charity fundraiser in the Last Year: Never true   [place] in the Last Year: Never true  Transportation Needs: No Transportation Needs   Lack of Transportation (Medical): No   Lack of Transportation (Non-Medical): No  Physical Activity: Inactive   Days of Exercise per Week: 0 days   Minutes of Exercise per Session: 0 min  Stress: No Stress Concern Present   Feeling of Stress : Not at all  Social Connections: Not on file    Tobacco Counseling Counseling given: Not Answered   Clinical Intake:  Pre-visit preparation completed: Yes  Pain : No/denies pain     Nutritional Status: BMI > 30  Obese Nutritional Risks: Nausea/ vomitting/ diarrhea (vomiting and diarrhea episodes 3 weeks ago, diarrhea on Monday night resolved) Diabetes: No  How often do you need to have someone help you when you read instructions, pamphlets, or other written materials from your doctor or pharmacy?: 1 - Never What is the last grade level you completed in school?: 12th grade  Diabetic? no  Interpreter Needed?: No  Information entered by :: NAllen LPN   Activities of Daily Living In your present state of health, do you have any difficulty performing the following activities: 04/22/2021  Hearing? N  Vision? N  Difficulty concentrating or making decisions? N  Comment slower recall  Walking or climbing stairs? N  Dressing or bathing? N  Doing errands, shopping? N  Preparing Food and eating ? N  Using the Toilet? N  In the past six months, have you accidently leaked urine? N  Do you have problems with loss of bowel control? N  Managing your Medications? N  Managing your Finances? N  Housekeeping or managing your Housekeeping? N  Some recent data might be hidden    Patient Care Team: Glendale Chard, MD as PCP - General (Internal Medicine) Buford Dresser, MD as PCP - Cardiology (Cardiology)  Indicate any recent Medical Services you  may have received from other than [facility] providers in the past year (date may be approximate).     Assessment:   This is a routine wellness examination for Vanessa Ward.  Hearing/Vision screen Vision Screening - Comments:: No regular eye exams,   Dietary issues and exercise activities discussed: Current Exercise Habits: The patient does not participate in regular exercise at present   Goals Addressed             This Visit's Progress    Patient Stated       04/22/2021, get SOB under control       Depression Screen PHQ 2/9 Scores 04/22/2021 01/03/2020 01/03/2020 12/27/2018 08/02/2018 04/06/2018 12/21/2017  PHQ - 2 Score 0 0 0 0 0 4 0  PHQ- 9 Score - - - - - 5 -  Exception Documentation - - - - -  Other- indicate reason in comment box -  Not completed - - - - - Patient stated her girlfriend of 40 years just passed away on 05/11/2022. -    Fall Risk Fall Risk  04/22/2021 01/03/2020 01/03/2020 12/27/2018 08/02/2018  Falls in the past year? 0 0 0 0 0  Comment - - - - -  Number falls in past yr: - - - 0 -  Injury with Fall? - - - - -  Risk for fall due to : Medication side effect Medication side effect - Medication side effect -  Follow up Falls evaluation completed;Education provided;Falls prevention discussed Falls evaluation completed;Education provided;Falls prevention discussed - Falls evaluation completed;Education provided;Falls prevention discussed -    FALL RISK PREVENTION PERTAINING TO THE HOME:  Any stairs in or around the home? No  If so, are there any without handrails?  N/a Home free of loose throw rugs in walkways, pet beds, electrical cords, etc? Yes  Adequate lighting in your home to reduce risk of falls? Yes   ASSISTIVE DEVICES UTILIZED TO PREVENT FALLS:  Life alert? No  Use of a cane, walker or w/c? No  Grab bars in the bathroom? No  Shower chair or bench in shower? No  Elevated toilet seat or a handicapped toilet? Yes   TIMED UP AND GO:  Was the test performed? No  .    Gait steady and fast without use of assistive device  Cognitive Function:     6CIT Screen 04/22/2021 01/03/2020 12/27/2018 12/21/2017  What Year? 0 points 0 points 0 points 0 points  What month? 0 points 0 points 0 points 0 points  What time? 0 points 0 points 0 points 3 points  Count back from 20 0 points 0 points 0 points 0 points  Months in reverse 0 points 0 points 0 points 0 points  Repeat phrase 0 points 0 points 2 points 0 points  Total Score 0 0 2 3    Immunizations Immunization History  Administered Date(s) Administered   Fluad Quad(high Dose 65+) 01/15/2020, 10/09/2020   Influenza, High Dose Seasonal PF 12/21/2017, 12/27/2018   PFIZER(Purple Top)SARS-COV-2 Vaccination 04/12/2019, 05/08/2019, 12/01/2019   Zoster Recombinat (Shingrix) 10/09/2020, 04/04/2021   Zoster, Live 09/25/2013    TDAP status: Up to date  Flu Vaccine status: Up to date  Pneumococcal vaccine status: Up to date  Covid-19 vaccine status: Completed vaccines  Qualifies for Shingles Vaccine? Yes   Zostavax completed Yes   Shingrix Completed?: Yes  Screening Tests Health Maintenance  Topic Date Due   Pneumonia Vaccine 63+ Years old (1 - PCV) Never done   COLONOSCOPY (Pts 45-76yr Insurance coverage will need to be confirmed)  03/11/2018   COVID-19 Vaccine (4 - Booster for Pfizer series) 01/26/2020   TETANUS/TDAP  06/13/2022   MAMMOGRAM  10/30/2022   INFLUENZA VACCINE  Completed   DEXA SCAN  Completed   Hepatitis C Screening  Completed   Zoster Vaccines- Shingrix  Completed   HPV VACCINES  Aged Out    Health Maintenance  Health Maintenance Due  Topic Date Due   Pneumonia Vaccine 75 Years old (1 - PCV) Never done   COLONOSCOPY (Pts 45-41yrInsurance coverage will need to be confirmed)  03/11/2018   COVID-19 Vaccine (4 - Booster for Pf[place]oreries) 01/26/2020    Colorectal cancer screening: No longer required.   Mammogram status: Completed 10/29/2020. Repeat every year  Bone  Density status: Completed 12/08/2016.   Lung Cancer Screening: (Low Dose CT Chest recommended  if Age 58-80 years, 13 pack-year currently smoking OR have quit w/in 15years.) does not qualify.   Lung Cancer Screening Referral: no  Additional Screening:  Hepatitis C Screening: does qualify; Completed 08/02/2018  Vision Screening: Recommended annual ophthalmology exams for early detection of glaucoma and other disorders of the eye. Is the patient up to date with their annual eye exam?  No  Who is the provider or what is the name of the office in which the patient attends annual eye exams?  If pt is not established with a provider, would they like to be referred to a provider to establish care? No .   Dental Screening: Recommended annual dental exams for proper oral hygiene  Community Resource Referral / Chronic Care Management: CRR required this visit?  No   CCM required this visit?  No      Plan:     I have personally reviewed and noted the following in the patients chart:   Medical and social history Use of alcohol, tobacco or illicit drugs  Current medications and supplements including opioid prescriptions.  Functional ability and status Nutritional status Physical activity Advanced directives List of other physicians Hospitalizations, surgeries, and ER visits in previous 12 months Vitals Screenings to include cognitive, depression, and falls Referrals and appointments  In addition, I have reviewed and discussed with patient certain preventive protocols, quality metrics, and best practice recommendations. A written personalized care plan for preventive services as well as general preventive health recommendations were provided to patient.     Kellie Simmering, LPN   08/22/2421   Nurse Notes: none

## 2021-04-22 NOTE — Progress Notes (Signed)
?Industrial/product designer as a Education administrator for [name], MD.,have documented all relevant documentation on the behalf of [name], MD,as directed by  [name], MD while in the presence of [name], MD. ? ?This visit occurred during the SARS-CoV-2 public health emergency.  Safety protocols were in place, including screening questions prior to the visit, additional usage of staff PPE, and extensive cleaning of exam room while observing appropriate contact time as indicated for disinfecting solutions. ? ?Subjective:  ?  ? Patient ID: Vanessa Ward , female    DOB: April 20, 1945 , 76 y.o.   MRN: [id number] ? ? ?Chief Complaint  ?Patient presents with  ? Hypertension  ? ? ?HPI ? ?She presents today for BP check.  She reports compliance with meds. Denies headaches, chest pain and palpitations.  ? ?She also met with [facility] Advisor today for AWV.  ? ? ? ?Hypertension ?This is a chronic problem. The current episode started more than 1 year ago. The problem has been gradually improving since onset. The problem is controlled. Associated symptoms include shortness of breath. Pertinent negatives include no blurred vision or palpitations. Risk factors for coronary artery disease include dyslipidemia, obesity, post-menopausal state and sedentary lifestyle. Past treatments include ACE inhibitors and diuretics. The current treatment provides moderate improvement. Compliance problems include exercise.  Hypertensive end-organ damage includes kidney disease.   ? ?Past Medical History:  ?Diagnosis Date  ? Arthritis   ? Chronic kidney disease   ? resent diagnosis of hematuria due to benigh kidney tumor  ? GERD (gastroesophageal reflux disease)   ? Hypertension   ? Shingles   ?  ? ?Family History  ?Problem Relation Age of Onset  ? Hypertension Mother   ? Heart disease Mother   ? Hypertension Father   ? Diabetes Paternal Grandmother   ? ? ? ?Current Outpatient Medications:  ?  aspirin EC 81 MG tablet, Take 81 mg by mouth  daily., Disp: , Rfl:  ?  Cholecalciferol (DIALYVITE VITAMIN D 5000 PO), Take by mouth., Disp: , Rfl:  ?  lisinopril-hydrochlorothiazide (ZESTORETIC) 20-25 MG tablet, Take 1 tablet by mouth daily., Disp: 90 tablet, Rfl: 2 ?  loratadine (CLARITIN) 10 MG tablet, Take 1 tablet (10 mg total) by mouth daily as needed for allergies., Disp: 30 tablet, Rfl: 2 ?  mometasone (ELOCON) 0.1 % cream, Apply to affected area daily, Disp: 45 g, Rfl: 1 ?  omeprazole (PRILOSEC) 40 MG capsule, Take 1 capsule (40 mg total) by mouth daily., Disp: 90 capsule, Rfl: 2 ?  rosuvastatin (CRESTOR) 10 MG tablet, Take 1 tablet (10 mg total) by mouth daily., Disp: 90 tablet, Rfl: 2  ? ?Allergies  ?Allergen Reactions  ? Other Rash  ?  Perfume, scented lotion  ?  ? ?Review of Systems  ?Constitutional:  Positive for fatigue.  ?     States she is still feeling very fatigued since having COVID last year.   ?Eyes:  Negative for blurred vision.  ?Respiratory:  Positive for shortness of breath.   ?Cardiovascular: Negative.  Negative for palpitations.  ?Gastrointestinal: Negative.   ?Neurological: Negative.    ? ?Today's Vitals  ? 04/22/21 1441  ?BP: 124/80  ?Pulse: 87  ?Temp: 98 ?F (36.7 ?C)  ?TempSrc: Oral  ?Weight: 228 lb (103.4 kg)  ?Height: 5' 2.4" (1.585 m)  ? ?Body mass index is 41.17 kg/m?.  ?Wt Readings from Last 3 Encounters:  ?04/22/21 228 lb 9.6 oz (103.7 kg)  ?04/22/21 228 lb (103.4  kg)  ?10/09/20 227 lb 9.6 oz (103.2 kg)  ? ? ?Objective:  ?Physical Exam ?Vitals and nursing note reviewed.  ?Constitutional:   ?   Appearance: Normal appearance. She is obese.  ?HENT:  ?   Head: Normocephalic and atraumatic.  ?   Nose:  ?   Comments: Masked  ?   Mouth/Throat:  ?   Comments: Masked  ?Eyes:  ?   Extraocular Movements: Extraocular movements intact.  ?Cardiovascular:  ?   Rate and Rhythm: Normal rate and regular rhythm.  ?   Heart sounds: Normal heart sounds.  ?Pulmonary:  ?   Effort: Pulmonary effort is normal.  ?   Breath sounds: Normal breath  sounds.  ?Musculoskeletal:  ?   Cervical back: Normal range of motion.  ?Skin: ?   General: Skin is warm.  ?Neurological:  ?   General: No focal deficit present.  ?   Mental Status: She is alert.  ?Psychiatric:     ?   Mood and Affect: Mood normal.     ?   Behavior: Behavior normal.  ?    ?Assessment And Plan:  ?   ?1. Hypertensive nephropathy ?Comments: Chronic, well controlled. No med changes. Advised to follow low sodium diet. She will f/u in 6 months for her next evaluation. ?- CMP14+EGFR ? ?2. Stage 3a chronic kidney disease ([place]) ?Comments: Chronic, I will check renal function today. I will check add'l Renal labs as below.  ?- Protein electrophoresis, serum ?- Phosphorus ?- Parathyroid Hormone, Intact w/Ca ? ?3. Pure hypercholesterolemia ?Comments: Chronic, she is currently on rosuvastatin 107m daily. She will c/w current meds. Encouraged to avoid fried foods, increase fiber and to move more.  ?- CMP14+EGFR ? ?4. Atheros[place] of [place] ([place] ?Comments: She is on ASA and statin therapy, rosuvastatin 19m Importance of medication/dietary compliance was d/w patient.  ? ?5. Gastroesophageal reflux disease without esophagitis ?Comments: She was given refill of omeprazole 40 daily and reminded to stop eating 3 hours prior to lying down.  ?- omeprazole (PRILOSEC) 40 MG capsule; Take 1 capsule (40 mg total) by mouth daily.  Dispense: 90 capsule; Refill: 2 ? ?6. Shortness of breath ?Comments: She has had Cardiology eval. CBC performed at GI, hb 10.6. IF no relief with iron supplementation, I will refer her to Pulmonary given emphysema seen on CT.  ?- Iron, TIBC and Ferritin Panel ? ?7. Other abnormal glucose ?Comments: Her a1c has been elevated in the past. I will recheck this today. She is encouraged to avoid refined carbs and sweetened beverages. ?- Hemoglobin A1c ? ?8. Former smoker ?Comments: She agrees to repeat LDCT scan for lung CA screening. This is due June 2023.  ?- CT CHEST LUNG CA SCREEN LOW DOSE W/O CM;  Future ? ?9. Class 3 severe obesity due to excess calories with serious comorbidity and body mass index (BMI) of 40.0 to 44.9 in adult ([facility]?Comments: BMI 41. She is encouraged to initially strive for BMI<35 to decrease cardiac risk.  ? ?10. Drug therapy ?- Vitamin B12 ? ?11. Post-COVID syndrome ?Comments: I will check vitamin B12 level today. If low, I plan to supplement with IM injections weekly x 4, then monthly.  ?- Vitamin B12 ?- TSH ? ?Patient was given opportunity to ask questions. Patient verbalized understanding of the plan and was able to repeat key elements of the plan. All questions were answered to their satisfaction.  ?  ?I RoMaximino GreenlandMD, have reviewed all documentation for this visit. The  documentation on 04/22/21 for the exam, diagnosis, procedures, and orders are all accurate and complete.  ? ?IF YOU HAVE BEEN REFERRED TO A SPECIALIST, IT MAY TAKE 1-2 WEEKS TO SCHEDULE/PROCESS THE REFERRAL. IF YOU HAVE NOT HEARD FROM US/SPECIALIST IN TWO WEEKS, PLEASE GIVE [place] A CALL AT [phone].  ? ?THE PATIENT IS ENCOURAGED TO PRACTICE SOCIAL DISTANCING DUE TO THE COVID-19 PANDEMIC.   ?

## 2021-04-22 NOTE — Patient Instructions (Signed)

## 2021-04-24 LAB — PROTEIN ELECTROPHORESIS, SERUM
A/G Ratio: 1 (ref 0.7–1.7)
Albumin ELP: 3.7 g/dL (ref 2.9–4.4)
Alpha 1: 0.3 g/dL (ref 0.0–0.4)
Alpha 2: 0.8 g/dL (ref 0.4–1.0)
Beta: 1.3 g/dL (ref 0.7–1.3)
Gamma Globulin: 1.4 g/dL (ref 0.4–1.8)
Globulin, Total: 3.8 g/dL (ref 2.2–3.9)

## 2021-04-24 LAB — HEMOGLOBIN A1C
Est. average glucose Bld gHb Est-mCnc: 120 mg/dL
Hgb A1c MFr Bld: 5.8 % — ABNORMAL HIGH (ref 4.8–5.6)

## 2021-04-24 LAB — CMP14+EGFR
ALT: 16 IU/L (ref 0–32)
AST: 17 IU/L (ref 0–40)
Albumin/Globulin Ratio: 1.4 (ref 1.2–2.2)
Albumin: 4.4 g/dL (ref 3.7–4.7)
Alkaline Phosphatase: 66 IU/L (ref 44–121)
BUN/Creatinine Ratio: 19 (ref 12–28)
BUN: 25 mg/dL (ref 8–27)
Bilirubin Total: 0.5 mg/dL (ref 0.0–1.2)
CO2: 23 mmol/L (ref 20–29)
Calcium: 9.5 mg/dL (ref 8.7–10.3)
Chloride: 104 mmol/L (ref 96–106)
Creatinine, Ser: 1.31 mg/dL — ABNORMAL HIGH (ref 0.57–1.00)
Globulin, Total: 3.1 g/dL (ref 1.5–4.5)
Glucose: 109 mg/dL — ABNORMAL HIGH (ref 70–99)
Potassium: 4 mmol/L (ref 3.5–5.2)
Sodium: 142 mmol/L (ref 134–144)
Total Protein: 7.5 g/dL (ref 6.0–8.5)
eGFR: 43 mL/min/{1.73_m2} — ABNORMAL LOW (ref >59)

## 2021-04-24 LAB — IRON,TIBC AND FERRITIN PANEL
Ferritin: 301 ng/mL — ABNORMAL HIGH (ref 15–150)
Iron Saturation: 15 % (ref 15–55)
Iron: 35 ug/dL (ref 27–139)
Total Iron Binding Capacity: 227 ug/dL — ABNORMAL LOW (ref 250–450)
UIBC: 192 ug/dL (ref 118–369)

## 2021-04-24 LAB — VITAMIN B12: Vitamin B-12: 950 pg/mL (ref 232–1245)

## 2021-04-24 LAB — PTH, INTACT AND CALCIUM: PTH: 37 pg/mL (ref 15–65)

## 2021-04-24 LAB — PHOSPHORUS: Phosphorus: 3.3 mg/dL (ref 3.0–4.3)

## 2021-04-24 LAB — TSH: TSH: 0.719 u[IU]/mL (ref 0.450–4.500)

## 2021-05-06 ENCOUNTER — Encounter: Payer: Self-pay | Admitting: Internal Medicine

## 2021-05-06 DIAGNOSIS — Z1212 Encounter for screening for malignant neoplasm of rectum: Secondary | ICD-10-CM | POA: Diagnosis not present

## 2021-05-06 DIAGNOSIS — Z1211 Encounter for screening for malignant neoplasm of colon: Secondary | ICD-10-CM | POA: Diagnosis not present

## 2021-05-13 LAB — COLOGUARD: COLOGUARD: NEGATIVE

## 2021-05-15 DIAGNOSIS — Z23 Encounter for immunization: Secondary | ICD-10-CM | POA: Diagnosis not present

## 2021-05-19 DIAGNOSIS — Z20822 Contact with and (suspected) exposure to covid-19: Secondary | ICD-10-CM | POA: Diagnosis not present

## 2021-06-03 DIAGNOSIS — R0982 Postnasal drip: Secondary | ICD-10-CM | POA: Diagnosis not present

## 2021-06-03 DIAGNOSIS — J342 Deviated nasal septum: Secondary | ICD-10-CM | POA: Diagnosis not present

## 2021-06-03 DIAGNOSIS — J310 Chronic rhinitis: Secondary | ICD-10-CM | POA: Diagnosis not present

## 2021-06-03 DIAGNOSIS — J343 Hypertrophy of nasal turbinates: Secondary | ICD-10-CM | POA: Diagnosis not present

## 2021-06-25 DIAGNOSIS — Z20822 Contact with and (suspected) exposure to covid-19: Secondary | ICD-10-CM | POA: Diagnosis not present

## 2021-08-19 DIAGNOSIS — R0982 Postnasal drip: Secondary | ICD-10-CM | POA: Diagnosis not present

## 2021-08-19 DIAGNOSIS — J343 Hypertrophy of nasal turbinates: Secondary | ICD-10-CM | POA: Diagnosis not present

## 2021-08-19 DIAGNOSIS — J342 Deviated nasal septum: Secondary | ICD-10-CM | POA: Diagnosis not present

## 2021-08-19 DIAGNOSIS — J310 Chronic rhinitis: Secondary | ICD-10-CM | POA: Diagnosis not present

## 2021-08-26 ENCOUNTER — Ambulatory Visit
Admission: RE | Admit: 2021-08-26 | Discharge: 2021-08-26 | Disposition: A | Discharge status: Home / Self Care | Payer: Medicare Other | Source: Ambulatory Visit | Attending: Internal Medicine | Admitting: Internal Medicine

## 2021-08-26 DIAGNOSIS — J432 Centrilobular emphysema: Secondary | ICD-10-CM | POA: Diagnosis not present

## 2021-08-26 DIAGNOSIS — Z87891 Personal history of nicotine dependence: Secondary | ICD-10-CM | POA: Diagnosis not present

## 2021-08-26 DIAGNOSIS — K8020 Calculus of gallbladder without cholecystitis without obstruction: Secondary | ICD-10-CM | POA: Diagnosis not present

## 2021-08-26 DIAGNOSIS — J929 Pleural plaque without asbestos: Secondary | ICD-10-CM | POA: Diagnosis not present

## 2021-10-01 ENCOUNTER — Ambulatory Visit (HOSPITAL_BASED_OUTPATIENT_CLINIC_OR_DEPARTMENT_OTHER): Payer: Medicare Other | Admitting: Cardiology

## 2021-10-07 DIAGNOSIS — M5134 Other intervertebral disc degeneration, thoracic region: Secondary | ICD-10-CM | POA: Diagnosis not present

## 2021-10-07 DIAGNOSIS — M5136 Other intervertebral disc degeneration, lumbar region: Secondary | ICD-10-CM | POA: Diagnosis not present

## 2021-10-07 DIAGNOSIS — M9902 Segmental and somatic dysfunction of thoracic region: Secondary | ICD-10-CM | POA: Diagnosis not present

## 2021-10-07 DIAGNOSIS — M9903 Segmental and somatic dysfunction of lumbar region: Secondary | ICD-10-CM | POA: Diagnosis not present

## 2021-10-08 ENCOUNTER — Other Ambulatory Visit: Payer: Self-pay | Admitting: Internal Medicine

## 2021-10-08 DIAGNOSIS — M9902 Segmental and somatic dysfunction of thoracic region: Secondary | ICD-10-CM | POA: Diagnosis not present

## 2021-10-08 DIAGNOSIS — Z1231 Encounter for screening mammogram for malignant neoplasm of breast: Secondary | ICD-10-CM

## 2021-10-08 DIAGNOSIS — M9903 Segmental and somatic dysfunction of lumbar region: Secondary | ICD-10-CM | POA: Diagnosis not present

## 2021-10-08 DIAGNOSIS — M5136 Other intervertebral disc degeneration, lumbar region: Secondary | ICD-10-CM | POA: Diagnosis not present

## 2021-10-08 DIAGNOSIS — M5134 Other intervertebral disc degeneration, thoracic region: Secondary | ICD-10-CM | POA: Diagnosis not present

## 2021-10-14 DIAGNOSIS — M5136 Other intervertebral disc degeneration, lumbar region: Secondary | ICD-10-CM | POA: Diagnosis not present

## 2021-10-14 DIAGNOSIS — M5134 Other intervertebral disc degeneration, thoracic region: Secondary | ICD-10-CM | POA: Diagnosis not present

## 2021-10-14 DIAGNOSIS — M9903 Segmental and somatic dysfunction of lumbar region: Secondary | ICD-10-CM | POA: Diagnosis not present

## 2021-10-14 DIAGNOSIS — M9902 Segmental and somatic dysfunction of thoracic region: Secondary | ICD-10-CM | POA: Diagnosis not present

## 2021-10-21 DIAGNOSIS — M5136 Other intervertebral disc degeneration, lumbar region: Secondary | ICD-10-CM | POA: Diagnosis not present

## 2021-10-21 DIAGNOSIS — M9902 Segmental and somatic dysfunction of thoracic region: Secondary | ICD-10-CM | POA: Diagnosis not present

## 2021-10-21 DIAGNOSIS — M5134 Other intervertebral disc degeneration, thoracic region: Secondary | ICD-10-CM | POA: Diagnosis not present

## 2021-10-21 DIAGNOSIS — M9903 Segmental and somatic dysfunction of lumbar region: Secondary | ICD-10-CM | POA: Diagnosis not present

## 2021-10-28 ENCOUNTER — Ambulatory Visit: Payer: Medicare Other | Admitting: Internal Medicine

## 2021-10-30 ENCOUNTER — Ambulatory Visit
Admission: RE | Admit: 2021-10-30 | Discharge: 2021-10-30 | Disposition: A | Discharge status: Home / Self Care | Payer: Medicare Other | Source: Ambulatory Visit | Attending: Internal Medicine | Admitting: Internal Medicine

## 2021-10-30 ENCOUNTER — Ambulatory Visit: Payer: Medicare Other

## 2021-10-30 DIAGNOSIS — Z1231 Encounter for screening mammogram for malignant neoplasm of breast: Secondary | ICD-10-CM

## 2021-11-11 ENCOUNTER — Ambulatory Visit (INDEPENDENT_AMBULATORY_CARE_PROVIDER_SITE_OTHER): Payer: Medicare Other | Admitting: Cardiology

## 2021-11-11 ENCOUNTER — Encounter (HOSPITAL_BASED_OUTPATIENT_CLINIC_OR_DEPARTMENT_OTHER): Payer: Self-pay | Admitting: Cardiology

## 2021-11-11 VITALS — BP 118/74 | HR 75 | Ht 62.4 in | Wt 231.0 lb

## 2021-11-11 DIAGNOSIS — I10 Essential (primary) hypertension: Secondary | ICD-10-CM

## 2021-11-11 DIAGNOSIS — Z7189 Other specified counseling: Secondary | ICD-10-CM

## 2021-11-11 DIAGNOSIS — I700 Atherosclerosis of aorta: Secondary | ICD-10-CM

## 2021-11-11 DIAGNOSIS — R0609 Other forms of dyspnea: Secondary | ICD-10-CM | POA: Diagnosis not present

## 2021-11-11 NOTE — Patient Instructions (Signed)
Medication Instructions:  Your Physician recommend you continue on your current medication as directed.    *If you need a refill on your cardiac medications before your next appointment, please call your pharmacy*   Lab Work: None ordered today   Testing/Procedures: None ordered today   Follow-Up: At [facility], you and your health needs are our priority.  As part of our continuing mission to provide you with exceptional heart care, we have created designated Provider Care Teams.  These Care Teams include your primary Cardiologist (physician) and Advanced Practice Providers (APPs -  Physician Assistants and Nurse Practitioners) who all work together to provide you with the care you need, when you need it.  We recommend signing up for the patient portal called "MyChart".  Sign up information is provided on this After Visit Summary.  MyChart is used to connect with patients for Virtual Visits (Telemedicine).  Patients are able to view lab/test results, encounter notes, upcoming appointments, etc.  Non-urgent messages can be sent to your provider as well.   To learn more about what you can do with MyChart, go to [email or web address].    Your next appointment:   As needed  The format for your next appointment:   In Person  Provider:   Bridgette Christopher, MD           

## 2021-11-11 NOTE — Progress Notes (Signed)
Cardiology Office Note:    Date:  11/11/2021   ID:  Vanessa Ward, DOB December 08, 1945, MRN [id number]  PCP:  Glendale Chard, MD  Cardiologist:  Buford Dresser, MD  Referring MD: Glendale Chard, MD   CC: follow up  History of Present Illness:    Vanessa Ward is a 76 y.o. female with a hx of arthritis, chronic kidney disease, GERD, and hypertension who is seen for follow up. Previously seen as a new consult at the request of Glendale Chard, MD for the evaluation and management of Atherosclerosis of aorta ([place]) and dyspnea on exertion.  Cardiovascular risk factors: Prior clinical ASCVD: None  Comorbid conditions, including hypertension, hyperlipidemia, diabetes, chronic kidney disease: Hypertension (controlled with medication since her 74's), Hyperlipidemia (controlled since her 50's), Notes being borderline diabetes Metabolic syndrome/Obesity: Highest adult weight is current weight 231 Chronic inflammatory conditions: none Tobacco use history: Former smoker Family history: Hypertension, Diabetes Prior cardiac testing and/or incidental findings on other testing (ie coronary calcium): Exercise level: Housework, cleaning. No formal exercise. Current diet: Prefers fried foods, will eat dietary foods and normal foods  Today:  She says she has been doing okay. She states that she still experiences constant shortness of breath with exertion. Sometimes it does not bother her but other times it does.  She reports that she has been staying active and busy. For the last 19 months she has been taking care of her great grandson, who just started daycare. Due to this, she has been performing more yard work lately. She states that most days she will get around 5,000-7,000 steps.   Of note, she smoked for over thirty years.   She denies any palpitations, chest pain, or peripheral edema. No lightheadedness, headaches, syncope, orthopnea, or PND.  Past Medical History:  Diagnosis Date    Arthritis    Chronic kidney disease    resent diagnosis of hematuria due to benigh kidney tumor   GERD (gastroesophageal reflux disease)    Hypertension    Shingles     Past Surgical History:  Procedure Laterality Date   BIOPSY  10/15/2011   Procedure: BIOPSY;  Surgeon: Lahoma Crocker, MD;  Location: [facility];  Service: Gynecology;;  Cervical   CERVICAL CONIZATION W/BX  12/17/2011   Procedure: CONIZATION CERVIX WITH BIOPSY;  Surgeon: Lahoma Crocker, MD;  Location: [facility];  Service: Gynecology;  Laterality: N/A;   COLPOSCOPY  10/15/2011   Procedure: COLPOSCOPY;  Surgeon: Lahoma Crocker, MD;  Location: [facility];  Service: Gynecology;  Laterality: N/A;   DIAGNOSTIC LAPAROSCOPY     TUBAL LIGATION      Current Medications: Current Outpatient Medications on File Prior to Visit  Medication Sig   aspirin EC 81 MG tablet Take 81 mg by mouth daily.   Cholecalciferol (DIALYVITE VITAMIN D 5000 PO) Take by mouth.   lisinopril-hydrochlorothiazide (ZESTORETIC) 20-25 MG tablet Take 1 tablet by mouth daily.   loratadine (CLARITIN) 10 MG tablet Take 1 tablet (10 mg total) by mouth daily as needed for allergies.   omeprazole (PRILOSEC) 40 MG capsule Take 1 capsule (40 mg total) by mouth daily.   rosuvastatin (CRESTOR) 10 MG tablet Take 1 tablet (10 mg total) by mouth daily.   No current facility-administered medications on file prior to visit.     Allergies:   Other   Social History   Tobacco Use   Smoking status: Former    Packs/day: 0.50    Types: Cigarettes, Pipe    Start date: 1967  Quit date: 2016    Years since quitting: 7.7   Smokeless tobacco: Never  Vaping Use   Vaping Use: Never used  Substance Use Topics   Alcohol use: Yes    Alcohol/week: 2.0 standard drinks of alcohol    Types: 2 Standard drinks or equivalent per week   Drug use: No    Family History: family history includes Diabetes in her paternal grandmother; Heart disease in her mother; Hypertension in  her father and mother.  ROS:   Please see the history of present illness.  Additional pertinent ROS: (+) Exertional shortness of breath    EKGs/Labs/Other Studies Reviewed:    The following studies were reviewed today:  CT Chest 08/26/2021: IMPRESSION: 1. Lung-RADS 1, negative. Continue annual screening with low-dose chest CT without contrast in 12 months. 2. Aortic atherosclerosis. 3. Mild diffuse bronchial wall thickening with very mild centrilobular and paraseptal emphysema; imaging findings suggestive of underlying COPD. 4. Cholelithiasis.   Aortic Atherosclerosis (ICD10-I70.0) and Emphysema (ICD10-J43.9).   Echo 08/21/2020: 1. Left ventricular ejection fraction, by estimation, is 60 to 65%. The  left ventricle has normal function. The left ventricle has no regional  wall motion abnormalities. Left ventricular diastolic parameters were  normal. The average left ventricular  global longitudinal strain is -22.9 %. The global longitudinal strain is  normal.   2. Right ventricular systolic function is normal. The right ventricular  size is normal. Tricuspid regurgitation signal is inadequate for assessing  PA pressure.   3. The mitral valve is grossly normal. No evidence of mitral valve  regurgitation. No evidence of mitral stenosis.   4. The aortic valve is tricuspid. Aortic valve regurgitation is not  visualized. No aortic stenosis is present.   5. The inferior vena cava is normal in size with greater than 50%  respiratory variability, suggesting right atrial pressure of 3 mmHg.   Conclusion(s)/Recommendation(s): Normal biventricular function without  evidence of hemodynamically significant valvular heart disease.    Exercise Tolerance Test 08/05/2020: Poor exercise capacity, achieved 3.9 METS Hypertensive reponse to exercise, peak BP 198/85 Upsloping ST segment depression was noted during stress. No evidence of ischemia.   EKG:  EKG is personally reviewed.   11/11/21:  NSR at 75 bpm 07/23/2020: normal sinus rhythm with sinus arrhythmia, 71 bpm  Recent Labs: 04/09/2021: Hemoglobin 10.6; Platelets 286 04/22/2021: ALT 16; BUN 25; Creatinine, Ser 1.31; Potassium 4.0; Sodium 142; TSH 0.719  Recent Lipid Panel    Component Value Date/Time   CHOL 144 10/09/2020 0943   TRIG 96 10/09/2020 0943   HDL 61 10/09/2020 0943   CHOLHDL 2.4 10/09/2020 0943   LDLCALC 65 10/09/2020 0943    Physical Exam:    VS:  BP 118/74   Pulse 75   Ht 5' 2.4" (1.585 m)   Wt 231 lb (104.8 kg)   BMI 41.71 kg/m    Wt Readings from Last 3 Encounters:  11/11/21 231 lb (104.8 kg)  04/22/21 228 lb 9.6 oz (103.7 kg)  04/22/21 228 lb (103.4 kg)    GEN: Well nourished, well developed in no acute distress HEENT: Normal, moist mucous membranes NECK: No JVD CARDIAC: regular rhythm, normal S1 and S2, no rubs or gallops. No murmur. VASCULAR: Radial and DP pulses 2+ bilaterally. No carotid bruits RESPIRATORY:  Clear to auscultation without rales, wheezing or rhonchi  ABDOMEN: Soft, non-tender, non-distended MUSCULOSKELETAL:  Ambulates independently SKIN: Warm and dry, no edema NEUROLOGIC:  Alert and oriented x 3. No focal neuro deficits noted. PSYCHIATRIC:  Normal affect    ASSESSMENT:    1. Dyspnea on exertion   2. Aortic atherosclerosis ([place])   3. Essential hypertension   4. Cardiac risk counseling   5. Counseling on health promotion and disease prevention     PLAN:    Dyspnea on exertion: -reviewed prior treadmill stress and echo. No ischemia or structural abnormalities, but very poor exercise capacity with elevated blood pressure with activity -was on inhalers in the past, not currently taking. Also discussed that differential includes pulmonary etiology and deconditioning.  Aortic atherosclerosis: -continue aspirin, rosuvastatin  Hypertension: -continue lisinopril-HCTZ  Cardiac risk counseling and prevention recommendations: -recommend heart healthy/Mediterranean  diet, with whole grains, fruits, vegetable, fish, lean meats, nuts, and olive oil. Limit salt. -recommend moderate walking, 3-5 times/week for 30-50 minutes each session. Aim for at least 150 minutes.week. Goal should be pace of 3 miles/hours, or walking 1.5 miles in 30 minutes -recommend avoidance of tobacco products. Avoid excess alcohol. -ASCVD risk score: The 10-year ASCVD risk score (Arnett DK, et al., 2019) is: 10.8%   Values used to calculate the score:     Age: 62 years     Sex: Female     Is Non-Hispanic African American: Yes     Diabetic: No     Tobacco smoker: No     Systolic Blood Pressure: 518 mmHg     Is BP treated: Yes     HDL Cholesterol: 61 mg/dL     Total Cholesterol: 144 mg/dL    Plan for follow up: PRN.   Buford Dresser, MD, PhD, [facility]    Medication Adjustments/Labs and Tests Ordered: Current medicines are reviewed at length with the patient today.  Concerns regarding medicines are outlined above.  Orders Placed This Encounter  Procedures   EKG 12-Lead   No orders of the defined types were placed in this encounter.  Patient Instructions  Medication Instructions:  Your Physician recommend you continue on your current medication as directed.    *If you need a refill on your cardiac medications before your next appointment, please call your pharmacy*   Lab Work: None ordered today   Testing/Procedures: None ordered today   Follow-Up: At [facility], you and your health needs are our priority.  As part of our continuing mission to provide you with exceptional heart care, we have created designated Provider Care Teams.  These Care Teams include your primary Cardiologist (physician) and Advanced Practice Providers (APPs -  Physician Assistants and Nurse Practitioners) who all work together to provide you with the care you need, when you need it.  We recommend signing up for the patient portal called "MyChart".   Sign up information is provided on this After Visit Summary.  MyChart is used to connect with patients for Virtual Visits (Telemedicine).  Patients are able to view lab/test results, encounter notes, upcoming appointments, etc.  Non-urgent messages can be sent to your provider as well.   To learn more about what you can do with MyChart, go to [email or web address].    Your next appointment:   As needed  The format for your next appointment:   In Person  Provider:   Buford Dresser, MD            I,Breanna Adamick,acting as a scribe for Buford Dresser, MD.,have documented all relevant documentation on the behalf of Buford Dresser, MD,as directed by  Buford Dresser, MD while in the presence of Buford Dresser, MD.  I, Buford Dresser,  MD, have reviewed all documentation for this visit. The documentation on 11/11/21 for the exam, diagnosis, procedures, and orders are all accurate and complete.   Signed, Buford Dresser, MD PhD 11/11/2021     [place]

## 2021-11-18 DIAGNOSIS — Z23 Encounter for immunization: Secondary | ICD-10-CM | POA: Diagnosis not present

## 2021-12-15 ENCOUNTER — Encounter: Payer: Self-pay | Admitting: Internal Medicine

## 2021-12-15 ENCOUNTER — Ambulatory Visit (INDEPENDENT_AMBULATORY_CARE_PROVIDER_SITE_OTHER): Payer: Medicare Other | Admitting: Internal Medicine

## 2021-12-15 VITALS — BP 122/68 | HR 82 | Temp 98.3°F | Ht 62.0 in | Wt 228.0 lb

## 2021-12-15 DIAGNOSIS — I700 Atherosclerosis of aorta: Secondary | ICD-10-CM | POA: Diagnosis not present

## 2021-12-15 DIAGNOSIS — N1831 Chronic kidney disease, stage 3a: Secondary | ICD-10-CM | POA: Diagnosis not present

## 2021-12-15 DIAGNOSIS — J432 Centrilobular emphysema: Secondary | ICD-10-CM

## 2021-12-15 DIAGNOSIS — R7309 Other abnormal glucose: Secondary | ICD-10-CM | POA: Diagnosis not present

## 2021-12-15 DIAGNOSIS — Z6841 Body Mass Index (BMI) 40.0 and over, adult: Secondary | ICD-10-CM

## 2021-12-15 DIAGNOSIS — Z2821 Immunization not carried out because of patient refusal: Secondary | ICD-10-CM | POA: Diagnosis not present

## 2021-12-15 DIAGNOSIS — R0609 Other forms of dyspnea: Secondary | ICD-10-CM | POA: Diagnosis not present

## 2021-12-15 DIAGNOSIS — M25512 Pain in left shoulder: Secondary | ICD-10-CM | POA: Diagnosis not present

## 2021-12-15 DIAGNOSIS — E66813 Obesity, class 3: Secondary | ICD-10-CM

## 2021-12-15 DIAGNOSIS — I129 Hypertensive chronic kidney disease with stage 1 through stage 4 chronic kidney disease, or unspecified chronic kidney disease: Secondary | ICD-10-CM

## 2021-12-15 NOTE — Progress Notes (Signed)
Barnet Glasgow Martin,acting as a Education administrator for [name], MD.,have documented all relevant documentation on the behalf of [name], MD,as directed by  [name], MD while in the presence of [name], MD.    Subjective:     Patient ID: Vanessa Ward , female    DOB: 1945/09/12 , 76 y.o.   MRN: [id number]   Chief Complaint  Patient presents with   Hypertension    HPI  Patient presents today for a bp check. She reports compliance with meds. She denies headaches, chest pain and palpitations. However, she has been experiencing SOB w/ exertion. She states her sx started several months ago.  She has had full cardiac w/u without definitive cause for her sx. She is concerned about emphysema seen on low dose CT for lung CA screening.   BP Readings from Last 3 Encounters: 12/15/21 : 122/68 11/11/21 : 118/74 04/22/21 : 124/70    Hypertension This is a chronic problem. The current episode started more than 1 year ago. The problem has been gradually improving since onset. The problem is controlled. Associated symptoms include shortness of breath. Pertinent negatives include no blurred vision or palpitations. Risk factors for coronary artery disease include dyslipidemia, obesity, post-menopausal state and sedentary lifestyle. Past treatments include ACE inhibitors and diuretics. The current treatment provides moderate improvement. Compliance problems include exercise.  Hypertensive end-organ damage includes kidney disease.     Past Medical History:  Diagnosis Date   Arthritis    Chronic kidney disease    resent diagnosis of hematuria due to benigh kidney tumor   GERD (gastroesophageal reflux disease)    Hypertension    Shingles      Family History  Problem Relation Age of Onset   Hypertension Mother    Heart disease Mother    Hypertension Father    Diabetes Paternal Grandmother      Current Outpatient Medications:    aspirin EC 81 MG tablet, Take 81 mg by  mouth daily., Disp: , Rfl:    Cholecalciferol (DIALYVITE VITAMIN D 5000 PO), Take by mouth., Disp: , Rfl:    lisinopril-hydrochlorothiazide (ZESTORETIC) 20-25 MG tablet, Take 1 tablet by mouth daily., Disp: 90 tablet, Rfl: 2   loratadine (CLARITIN) 10 MG tablet, Take 1 tablet (10 mg total) by mouth daily as needed for allergies., Disp: 30 tablet, Rfl: 2   omeprazole (PRILOSEC) 40 MG capsule, Take 1 capsule (40 mg total) by mouth daily., Disp: 90 capsule, Rfl: 2   rosuvastatin (CRESTOR) 10 MG tablet, Take 1 tablet (10 mg total) by mouth daily., Disp: 90 tablet, Rfl: 2   Allergies  Allergen Reactions   Other Rash    Perfume, scented lotion     Review of Systems  Constitutional: Negative.   HENT: Negative.    Eyes: Negative.  Negative for blurred vision.  Respiratory:  Positive for shortness of breath.   Cardiovascular: Negative.  Negative for palpitations.  Gastrointestinal: Negative.   Musculoskeletal:  Positive for arthralgias.       She c/o left shoulder pain, sx started 2 months ago. She initially went to chiropractor, no relief in her sx. There is pain with movement.      Today's Vitals   12/15/21 1601  BP: 122/68  Pulse: 82  Temp: 98.3 F (36.8 C)  TempSrc: Oral  Weight: 228 lb (103.4 kg)  Height: _0  (1.575 m)  PainSc: 4   PainLoc: Shoulder   Body mass index is 41.7 kg/m.  Wt Readings from Last 3 Encounters:  12/15/21 228 lb (103.4 kg)  11/11/21 231 lb (104.8 kg)  04/22/21 228 lb 9.6 oz (103.7 kg)    Objective:  Physical Exam Vitals and nursing note reviewed.  Constitutional:      Appearance: Normal appearance.  HENT:     Head: Normocephalic and atraumatic.     Nose:     Comments: Masked     Mouth/Throat:     Comments: Masked  Eyes:     Extraocular Movements: Extraocular movements intact.  Cardiovascular:     Rate and Rhythm: Normal rate and regular rhythm.     Heart sounds: Normal heart sounds.  Pulmonary:     Effort: Pulmonary effort is normal.      Breath sounds: Normal breath sounds.  Abdominal:     General: Bowel sounds are normal.     Palpations: Abdomen is soft.  Musculoskeletal:        General: Tenderness present.     Cervical back: Normal range of motion.  Skin:    General: Skin is warm.  Neurological:     General: No focal deficit present.     Mental Status: She is alert.  Psychiatric:        Mood and Affect: Mood normal.        Behavior: Behavior normal.      Assessment And Plan:     1. Hypertensive nephropathy Comments: Chronic, controlled. She will c/w lisinopril/hctz 20/47m daily. She is encouraged to follow low sodium diet.  - Amb Referral To Provider Referral Exercise Program (P.R.E.P) - CMP14+EGFR  2. Stage 3a chronic kidney disease (HCC) Comments: Chronic, this has been stable. Stages of CKD reviewed, reminded to avoid NSAIDs including Motrin, ibuprofen, Advil and Aleve.  - Amb Referral To Provider Referral Exercise Program (P.R.E.P) - CMP14+EGFR  3. Atherosclerosis of aorta (HCC) Comments: Chronic, LDL goal <70. She will c/w rosuvastatin 15maily. She will c/w Asa 8182maily as well.   4. Dyspnea on exertion Comments: She has completed cardiac w/u, will refer her to Pulmonary for PFTs and further evaluation.  - Ambulatory referral to Pulmonology - Pulmonary function test; Future - CBC no Diff  5. Acute pain of left shoulder Comments: I will refer her to Ortho for further evaluation and radiographic studies.  - Ambulatory referral to Sports Medicine  6. Centrilobular emphysema (HCC) Comments: Low dose CT scan results reviewed. She agrees to Pulmonary evaluation, has tried albuterol MDI in the past w/o relief of her sx. Does not wish to try steroid in  7. Other abnormal glucose Comments: Her a1c has been elevated in the past. I will recheck an a1c today. She is encouraged to decrease her intake of sugary beverages and processed meats.  - Hemoglobin A1c  8. Class 3 severe obesity due to excess  calories with serious comorbidity and body mass index (BMI) of 40.0 to 44.9 in adult ([facility]: BMI 41. She agrees to PRECitigroupferral, will schedule initial visit after Pulmonary evaluation.  - Amb Referral To Provider Referral Exercise Program (P.R.E.P)  9. Immunization declined   Patient was given opportunity to ask questions. Patient verbalized understanding of the plan and was able to repeat key elements of the plan. All questions were answered to their satisfaction.   I, RobMaximino GreenlandD, have reviewed all documentation for this visit. The documentation on 12/15/21 for the exam, diagnosis, procedures, and orders are all accurate and complete.   IF YOU HAVE BEEN REFERRED TO  A SPECIALIST, IT MAY TAKE 1-2 WEEKS TO SCHEDULE/PROCESS THE REFERRAL. IF YOU HAVE NOT HEARD FROM US/SPECIALIST IN TWO WEEKS, PLEASE GIVE US A CALL AT [phone] X [phone].   THE PATIENT IS ENCOURAGED TO PRACTICE SOCIAL DISTANCING DUE TO THE COVID-19 PANDEMIC.   

## 2021-12-15 NOTE — Patient Instructions (Addendum)
Emphysema Chronic Obstructive Pulmonary Disease  Chronic obstructive pulmonary disease (COPD) is a long-term (chronic) lung problem. When you have COPD, it is hard for air to get in and out of your lungs. Usually the condition gets worse over time, and your lungs will never return to normal. There are things you can do to keep yourself as healthy as possible. What are the causes? Smoking. This is the most common cause. Certain genes passed from parent to child (inherited). What increases the risk? Being exposed to secondhand smoke from cigarettes, pipes, or cigars. Being exposed to chemicals and other irritants, such as fumes and dust in the work environment. Having chronic lung conditions or infections. What are the signs or symptoms? Shortness of breath, especially during physical activity. A long-term cough with a large amount of thick mucus. Sometimes, the cough may not have any mucus (dry cough). Wheezing. Breathing quickly. Skin that looks gray or blue, especially in the fingers, toes, or lips. Feeling tired (fatigue). Weight loss. Chest tightness. Having infections often. Episodes when breathing symptoms become much worse (exacerbations). At the later stages of this disease, you may have swelling in the ankles, feet, or legs. How is this treated? Taking medicines. Quitting smoking, if you smoke. Rehabilitation. This includes steps to make your body work better. It may involve a team of specialists. Doing exercises. Making changes to your diet. Using oxygen. Lung surgery. Lung transplant. Comfort measures (palliative care). Follow these instructions at home: Medicines Take over-the-counter and prescription medicines only as told by your doctor. Talk to your doctor before taking any cough or allergy medicines. You may need to avoid medicines that cause your lungs to be dry. Lifestyle If you smoke, stop smoking. Smoking makes the problem worse. Do not smoke or use any  products that contain nicotine or tobacco. If you need help quitting, ask your doctor. Avoid being around things that make your breathing worse. This may include smoke, chemicals, and fumes. Stay active, but remember to rest as well. Learn and use tips on how to manage stress and control your breathing. Make sure you get enough sleep. Most adults need at least 7 hours of sleep every night. Eat healthy foods. Eat smaller meals more often. Rest before meals. Controlled breathing Learn and use tips on how to control your breathing as told by your doctor. Try: Breathing in (inhaling) through your nose for 1 second. Then, pucker your lips and breath out (exhale) through your lips for 2 seconds. Putting one hand on your belly (abdomen). Breathe in slowly through your nose for 1 second. Your hand on your belly should move out. Pucker your lips and breathe out slowly through your lips. Your hand on your belly should move in as you breathe out.  Controlled coughing Learn and use controlled coughing to clear mucus from your lungs. Follow these steps: Lean your head a little forward. Breathe in deeply. Try to hold your breath for 3 seconds. Keep your mouth slightly open while coughing 2 times. Spit any mucus out into a tissue. Rest and do the steps again 1 or 2 times as needed. General instructions Make sure you get all the shots (vaccines) that your doctor recommends. Ask your doctor about a flu shot and a pneumonia shot. Use oxygen therapy and pulmonary rehabilitation if told by your doctor. If you need home oxygen therapy, ask your doctor if you should buy a tool to measure your oxygen level (oximeter). Make a COPD action plan with your doctor. This helps  you to know what to do if you feel worse than usual. Manage any other conditions you have as told by your doctor. Avoid going outside when it is very hot, cold, or humid. Avoid people who have a sickness you can catch (contagious). Keep all  follow-up visits. Contact a doctor if: You cough up more mucus than usual. There is a change in the color or thickness of the mucus. It is harder to breathe than usual. Your breathing is faster than usual. You have trouble sleeping. You need to use your medicines more often than usual. You have trouble doing your normal activities such as getting dressed or walking around the house. Get help right away if: You have shortness of breath while resting. You have shortness of breath that stops you from: Being able to talk. Doing normal activities. Your chest hurts for longer than 5 minutes. Your skin color is more blue than usual. Your pulse oximeter shows that you have low oxygen for longer than 5 minutes. You have a fever. You feel too tired to breathe normally. These symptoms may represent a serious problem that is an emergency. Do not wait to see if the symptoms will go away. Get medical help right away. Call your local emergency services (911 in the [place] Do not drive yourself to the hospital. Summary Chronic obstructive pulmonary disease (COPD) is a long-term lung problem. The way your lungs work will never return to normal. Usually the condition gets worse over time. There are things you can do to keep yourself as healthy as possible. Take over-the-counter and prescription medicines only as told by your doctor. If you smoke, stop. Smoking makes the problem worse. This information is not intended to replace advice given to you by your health care provider. Make sure you discuss any questions you have with your health care provider. Document Revised: 12/11/2019 Document Reviewed: 12/11/2019 Elsevier Patient Education  [place].  Hypertension, Adult Hypertension is another name for high blood pressure. High blood pressure forces your heart to work harder to pump blood. This can cause problems over time. There are two numbers in a blood pressure reading. There is a top number  (systolic) over a bottom number (diastolic). It is best to have a blood pressure that is below 120/80. What are the causes? The cause of this condition is not known. Some other conditions can lead to high blood pressure. What increases the risk? Some lifestyle factors can make you more likely to develop high blood pressure: Smoking. Not getting enough exercise or physical activity. Being overweight. Having too much fat, sugar, calories, or salt (sodium) in your diet. Drinking too much alcohol. Other risk factors include: Having any of these conditions: Heart disease. Diabetes. High cholesterol. Kidney disease. Obstructive sleep apnea. Having a family history of high blood pressure and high cholesterol. Age. The risk increases with age. Stress. What are the signs or symptoms? High blood pressure may not cause symptoms. Very high blood pressure (hypertensive crisis) may cause: Headache. Fast or uneven heartbeats (palpitations). Shortness of breath. Nosebleed. Vomiting or feeling like you may vomit (nauseous). Changes in how you see. Very bad chest pain. Feeling dizzy. Seizures. How is this treated? This condition is treated by making healthy lifestyle changes, such as: Eating healthy foods. Exercising more. Drinking less alcohol. Your doctor may prescribe medicine if lifestyle changes do not help enough and if: Your top number is above 130. Your bottom number is above 80. Your personal target blood pressure may vary.  Follow these instructions at home: Eating and drinking  If told, follow the DASH eating plan. To follow this plan: Fill one half of your plate at each meal with fruits and vegetables. Fill one fourth of your plate at each meal with whole grains. Whole grains include whole-wheat pasta, brown rice, and whole-grain bread. Eat or drink low-fat dairy products, such as skim milk or low-fat yogurt. Fill one fourth of your plate at each meal with low-fat (lean)  proteins. Low-fat proteins include fish, chicken without skin, eggs, beans, and tofu. Avoid fatty meat, cured and processed meat, or chicken with skin. Avoid pre-made or processed food. Limit the amount of salt in your diet to less than 1,500 mg each day. Do not drink alcohol if: Your doctor tells you not to drink. You are pregnant, may be pregnant, or are planning to become pregnant. If you drink alcohol: Limit how much you have to: 0-1 drink a day for women. 0-2 drinks a day for men. Know how much alcohol is in your drink. In the [place] one drink equals one 12 oz bottle of beer (355 mL), one 5 oz glass of wine (148 mL), or one 1 oz glass of hard liquor (44 mL). Lifestyle  Work with your doctor to stay at a healthy weight or to lose weight. Ask your doctor what the best weight is for you. Get at least 30 minutes of exercise that causes your heart to beat faster (aerobic exercise) most days of the week. This may include walking, swimming, or biking. Get at least 30 minutes of exercise that strengthens your muscles (resistance exercise) at least 3 days a week. This may include lifting weights or doing Pilates. Do not smoke or use any products that contain nicotine or tobacco. If you need help quitting, ask your doctor. Check your blood pressure at home as told by your doctor. Keep all follow-up visits. Medicines Take over-the-counter and prescription medicines only as told by your doctor. Follow directions carefully. Do not skip doses of blood pressure medicine. The medicine does not work as well if you skip doses. Skipping doses also puts you at risk for problems. Ask your doctor about side effects or reactions to medicines that you should watch for. Contact a doctor if: You think you are having a reaction to the medicine you are taking. You have headaches that keep coming back. You feel dizzy. You have swelling in your ankles. You have trouble with your vision. Get help right away  if: You get a very bad headache. You start to feel mixed up (confused). You feel weak or numb. You feel faint. You have very bad pain in your: Chest. Belly (abdomen). You vomit more than once. You have trouble breathing. These symptoms may be an emergency. Get help right away. Call 911. Do not wait to see if the symptoms will go away. Do not drive yourself to the hospital. Summary Hypertension is another name for high blood pressure. High blood pressure forces your heart to work harder to pump blood. For most people, a normal blood pressure is less than 120/80. Making healthy choices can help lower blood pressure. If your blood pressure does not get lower with healthy choices, you may need to take medicine. This information is not intended to replace advice given to you by your health care provider. Make sure you discuss any questions you have with your health care provider. Document Revised: 11/20/2020 Document Reviewed: 11/20/2020 Elsevier Patient Education  [place].

## 2021-12-16 LAB — CMP14+EGFR
ALT: 20 IU/L (ref 0–32)
AST: 21 IU/L (ref 0–40)
Albumin/Globulin Ratio: 1.5 (ref 1.2–2.2)
Albumin: 4.3 g/dL (ref 3.8–4.8)
Alkaline Phosphatase: 65 IU/L (ref 44–121)
BUN/Creatinine Ratio: 18 (ref 12–28)
BUN: 24 mg/dL (ref 8–27)
Bilirubin Total: 0.3 mg/dL (ref 0.0–1.2)
CO2: 24 mmol/L (ref 20–29)
Calcium: 9.1 mg/dL (ref 8.7–10.3)
Chloride: 103 mmol/L (ref 96–106)
Creatinine, Ser: 1.35 mg/dL — ABNORMAL HIGH (ref 0.57–1.00)
Globulin, Total: 2.9 g/dL (ref 1.5–4.5)
Glucose: 86 mg/dL (ref 70–99)
Potassium: 4.2 mmol/L (ref 3.5–5.2)
Sodium: 139 mmol/L (ref 134–144)
Total Protein: 7.2 g/dL (ref 6.0–8.5)
eGFR: 41 mL/min/{1.73_m2} — ABNORMAL LOW (ref >59)

## 2021-12-16 LAB — CBC
Hematocrit: 34.8 % (ref 34.0–46.6)
Hemoglobin: 10.8 g/dL — ABNORMAL LOW (ref 11.1–15.9)
MCH: 25.5 pg — ABNORMAL LOW (ref 26.6–33.0)
MCHC: 31 g/dL — ABNORMAL LOW (ref 31.5–35.7)
MCV: 82 fL (ref 79–97)
Platelets: 330 10*3/uL (ref 150–450)
RBC: 4.23 x10E6/uL (ref 3.77–5.28)
RDW: 13.7 % (ref 11.7–15.4)
WBC: 8.5 10*3/uL (ref 3.4–10.8)

## 2021-12-16 LAB — HEMOGLOBIN A1C
Est. average glucose Bld gHb Est-mCnc: 123 mg/dL
Hgb A1c MFr Bld: 5.9 % — ABNORMAL HIGH (ref 4.8–5.6)

## 2021-12-18 ENCOUNTER — Telehealth: Payer: Self-pay

## 2021-12-18 NOTE — Telephone Encounter (Signed)
VMT pt requesting call back reference PREP referral.   

## 2021-12-24 DIAGNOSIS — M24812 Other specific joint derangements of left shoulder, not elsewhere classified: Secondary | ICD-10-CM | POA: Diagnosis not present

## 2021-12-24 DIAGNOSIS — M67912 Unspecified disorder of synovium and tendon, left shoulder: Secondary | ICD-10-CM | POA: Diagnosis not present

## 2021-12-29 DIAGNOSIS — M7542 Impingement syndrome of left shoulder: Secondary | ICD-10-CM | POA: Diagnosis not present

## 2021-12-30 ENCOUNTER — Encounter: Payer: Self-pay | Admitting: Pulmonary Disease

## 2021-12-30 ENCOUNTER — Ambulatory Visit (INDEPENDENT_AMBULATORY_CARE_PROVIDER_SITE_OTHER): Payer: Medicare Other | Admitting: Pulmonary Disease

## 2021-12-30 VITALS — BP 118/74 | HR 85 | Ht 62.0 in | Wt 226.0 lb

## 2021-12-30 DIAGNOSIS — J432 Centrilobular emphysema: Secondary | ICD-10-CM | POA: Diagnosis not present

## 2021-12-30 DIAGNOSIS — Z87891 Personal history of nicotine dependence: Secondary | ICD-10-CM | POA: Diagnosis not present

## 2021-12-30 MED ORDER — TRELEGY ELLIPTA 100-62.5-25 MCG/ACT IN AEPB: 1.0000 | INHALATION_SPRAY | Freq: Every day | RESPIRATORY_TRACT | 0 refills | Status: DC

## 2021-12-30 NOTE — Patient Instructions (Addendum)
You have mild emphysematous changes on your CT scan which can represent COPD  Start trelegy ellipta 1 puff daily - rinse mouth out  Follow up in 2 months for pulmonary function tests and visit

## 2021-12-30 NOTE — Progress Notes (Signed)
Synopsis: Referred in November 2023 for shortness of breath by Glendale Chard, MD  Subjective:   PATIENT ID: Vanessa Ward GENDER: female DOB: April 28, 1945, MRN: [id number]   HPI  Chief Complaint  Patient presents with   Consult    Referred by PCP for DOE for the past year. Has been cleared from a cardiology standpoint. Increased congestion when the weather changes.    Vanessa Ward is a 76 year old woman, former smoker with GERD, hypertension and CKD who is referred to pulmonary clinic for shortness of breath.   She reports developing shortness of breath over recent months. The dyspnea is with exertion. She denies wheezing or cough. She denies night time awakenings due to shortness of breath. She does report snoring. She does feel rested a majority of the time after a full night of sleep. Denies daytime sleepiness. She does not take naps.   She has been evaluated by cardiology with normal echo. Stress test did not show ischemic changes but she has poor exercise capacity and blood pressure was 198/85 during exercise.  She quit smoking 10 years ago. She smoked for 30 years, less than a pack per day. She is enrolled in lung cancer screening through her PCP. 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 She lives alone and has children that live in the area.   Past Medical History:  Diagnosis Date   Arthritis    Chronic kidney disease    resent diagnosis of hematuria due to benigh kidney tumor   GERD (gastroesophageal reflux disease)    Hypertension    Shingles      Family History  Problem Relation Age of Onset   Hypertension Mother    Heart disease Mother    Hypertension Father    Diabetes Paternal Grandmother      Social History   Socioeconomic History   Marital status: Single    Spouse name: Not on file   Number of children: Not on file   Years of education: Not on file   Highest  education level: Not on file  Occupational History   Not on file  Tobacco Use   Smoking status: Former    Packs/day: 0.50    Types: Cigarettes, Pipe    Start date: 65    Quit date: 2016    Years since quitting: 7.8   Smokeless tobacco: Never  Vaping Use   Vaping Use: Never used  Substance and Sexual Activity   Alcohol use: Yes    Alcohol/week: 2.0 standard drinks of alcohol    Types: 2 Standard drinks or equivalent per week   Drug use: No   Sexual activity: Not Currently    Birth control/protection: Post-menopausal  Other Topics Concern   Not on file  Social History Narrative   Not on file   Social Determinants of Health   Financial Resource Strain: Low Risk  (04/22/2021)   Overall Financial Resource Strain (CARDIA)    Difficulty of Paying Living Expenses: Not hard at all  Food Insecurity: No Food Insecurity (04/22/2021)   Hunger Vital Sign    Worried About Running Out of Food in the Last Year: Never true    Ran Out of Food in the Last Year: Never true  Transportation Needs: No Transportation Needs (04/22/2021)   PRAPARE - Hydrologist (Medical): No    Lack of Transportation (Non-Medical): No  Physical  Activity: Inactive (04/22/2021)   Exercise Vital Sign    Days of Exercise per Week: 0 days    Minutes of Exercise per Session: 0 min  Stress: No Stress Concern Present (04/22/2021)   [place]    Feeling of Stress : Not at all  Social Connections: Not on file  Intimate Partner Violence: Not At Risk (12/21/2017)   Humiliation, Afraid, Rape, and Kick questionnaire    Fear of Current or Ex-Partner: No    Emotionally Abused: No    Physically Abused: No    Sexually Abused: No     Allergies  Allergen Reactions   Other Rash    Perfume, scented lotion     Outpatient Medications Prior to Visit  Medication Sig Dispense Refill   aspirin EC 81 MG tablet Take 81 mg by mouth daily.      Cholecalciferol (DIALYVITE VITAMIN D 5000 PO) Take by mouth.     lisinopril-hydrochlorothiazide (ZESTORETIC) 20-25 MG tablet Take 1 tablet by mouth daily. 90 tablet 2   omeprazole (PRILOSEC) 40 MG capsule Take 1 capsule (40 mg total) by mouth daily. 90 capsule 2   rosuvastatin (CRESTOR) 10 MG tablet Take 1 tablet (10 mg total) by mouth daily. 90 tablet 2   loratadine (CLARITIN) 10 MG tablet Take 1 tablet (10 mg total) by mouth daily as needed for allergies. 30 tablet 2   No facility-administered medications prior to visit.    Review of Systems  Constitutional:  Negative for chills, fever, malaise/fatigue and weight loss.  HENT:  Negative for congestion, sinus pain and sore throat.   Eyes: Negative.   Respiratory:  Positive for shortness of breath. Negative for cough, hemoptysis, sputum production and wheezing.   Cardiovascular:  Negative for chest pain, palpitations, orthopnea, claudication and leg swelling.  Gastrointestinal:  Negative for abdominal pain, heartburn, nausea and vomiting.  Genitourinary: Negative.   Musculoskeletal:  Negative for joint pain and myalgias.  Skin:  Negative for rash.  Neurological:  Negative for weakness.  Endo/Heme/Allergies: Negative.   Psychiatric/Behavioral: Negative.        Objective:   Vitals:   12/30/21 1110  BP: 118/74  Pulse: 85  SpO2: 98%  Weight: 226 lb (102.5 kg)  Height: '5\' 2"'$  (1.575 m)     Physical Exam Constitutional:      General: She is not in acute distress.    Appearance: She is not ill-appearing.  HENT:     Head: Normocephalic and atraumatic.  Eyes:     General: No scleral icterus.    Conjunctiva/sclera: Conjunctivae normal.     Pupils: Pupils are equal, round, and reactive to light.  Cardiovascular:     Rate and Rhythm: Normal rate and regular rhythm.     Pulses: Normal pulses.     Heart sounds: Normal heart sounds. No murmur heard. Pulmonary:     Effort: Pulmonary effort is normal.     Breath sounds: Normal  breath sounds. No wheezing, rhonchi or rales.  Abdominal:     General: Bowel sounds are normal.     Palpations: Abdomen is soft.  Musculoskeletal:     Right lower leg: No edema.     Left lower leg: No edema.  Lymphadenopathy:     Cervical: No cervical adenopathy.  Skin:    General: Skin is warm and dry.  Neurological:     General: No focal deficit present.     Mental Status: She is alert.  Psychiatric:  Mood and Affect: Mood normal.        Behavior: Behavior normal.        Thought Content: Thought content normal.        Judgment: Judgment normal.       CBC    Component Value Date/Time   WBC 8.5 12/15/2021 1711   WBC 7.1 12/13/2011 1424   RBC 4.23 12/15/2021 1711   RBC 4.16 04/09/2021 0000   HGB 10.8 (L) 12/15/2021 1711   HCT 34.8 12/15/2021 1711   PLT 330 12/15/2021 1711   MCV 82 12/15/2021 1711   MCH 25.5 (L) 12/15/2021 1711   MCH 26.2 12/13/2011 1424   MCHC 31.0 (L) 12/15/2021 1711   MCHC 31.7 12/13/2011 1424   RDW 13.7 12/15/2021 1711      Latest Ref Rng & Units 12/15/2021    5:11 PM 04/22/2021    3:57 PM 10/09/2020    9:43 AM  BMP  Glucose 70 - 99 mg/dL 86  109  90   BUN 8 - 27 mg/dL '24  25  25   '$ Creatinine 0.57 - 1.00 mg/dL 1.35  1.31  1.19   BUN/Creat Ratio 12 - '28 18  19  21   '$ Sodium 134 - 144 mmol/L 139  142  143   Potassium 3.5 - 5.2 mmol/L 4.2  4.0  4.0   Chloride 96 - 106 mmol/L 103  104  105   CO2 20 - 29 mmol/L '24  23  23   '$ Calcium 8.7 - 10.3 mg/dL 9.1  9.5  9.4    Chest imaging: LCS CT Chest 08/26/21 1. Lung-RADS 1, negative. Continue annual screening with low-dose chest CT without contrast in 12 months. 2. Aortic atherosclerosis. 3. Mild diffuse bronchial wall thickening with very mild centrilobular and paraseptal emphysema; imaging findings suggestive of underlying COPD. 4. Cholelithiasis.  PFT:     No data to display          Labs:  Path:  Echo 08/21/20:  1. Left ventricular ejection fraction, by estimation, is 60 to  65%. The  left ventricle has normal function. The left ventricle has no regional  wall motion abnormalities. Left ventricular diastolic parameters were  normal. The average left ventricular  global longitudinal strain is -22.9 %. The global longitudinal strain is  normal.   2. Right ventricular systolic function is normal. The right ventricular  size is normal. Tricuspid regurgitation signal is inadequate for assessing  PA pressure.   3. The mitral valve is grossly normal. No evidence of mitral valve  regurgitation. No evidence of mitral stenosis.   4. The aortic valve is tricuspid. Aortic valve regurgitation is not  visualized. No aortic stenosis is present.   5. The inferior vena cava is normal in size with greater than 50%  respiratory variability, suggesting right atrial pressure of 3 mmHg.  Heart Catheterization:  Assessment & Plan:   Centrilobular emphysema ([place]) - Plan: Pulmonary Function Test, Fluticasone-Umeclidin-Vilant (TRELEGY ELLIPTA) 100-62.5-25 MCG/ACT AEPB  Former smoker  Discussion: Vanessa Ward is a 76 year old woman, former smoker with GERD, hypertension and CKD who is referred to pulmonary clinic for shortness of breath.   Her shortness of breath could be related to obstructive lung disease as she has mild emphysematous changes on recent CT chest scan.  She is to try trelegy ellipta 1 puff daily and will monitor for symptom improvement.   She may have underlying obstructive sleep apnea based on her history of snoring. Will consider sleep  testing in the future.  She also has evidence of hypertensive changes with exercise which could be contributing to her exertional dyspnea.  Follow up in 2 months with pulmonary function tests.  Freda Jackson, MD [facility] Office: [phone]   Current Outpatient Medications:    aspirin EC 81 MG tablet, Take 81 mg by mouth daily., Disp: , Rfl:    Cholecalciferol (DIALYVITE VITAMIN D 5000 PO),  Take by mouth., Disp: , Rfl:    Fluticasone-Umeclidin-Vilant (TRELEGY ELLIPTA) 100-62.5-25 MCG/ACT AEPB, Inhale 1 puff into the lungs daily., Disp: 1 each, Rfl: 0   lisinopril-hydrochlorothiazide (ZESTORETIC) 20-25 MG tablet, Take 1 tablet by mouth daily., Disp: 90 tablet, Rfl: 2   omeprazole (PRILOSEC) 40 MG capsule, Take 1 capsule (40 mg total) by mouth daily., Disp: 90 capsule, Rfl: 2   rosuvastatin (CRESTOR) 10 MG tablet, Take 1 tablet (10 mg total) by mouth daily., Disp: 90 tablet, Rfl: 2   loratadine (CLARITIN) 10 MG tablet, Take 1 tablet (10 mg total) by mouth daily as needed for allergies., Disp: 30 tablet, Rfl: 2

## 2022-01-01 DIAGNOSIS — M7542 Impingement syndrome of left shoulder: Secondary | ICD-10-CM | POA: Diagnosis not present

## 2022-01-05 DIAGNOSIS — M7542 Impingement syndrome of left shoulder: Secondary | ICD-10-CM | POA: Diagnosis not present

## 2022-01-11 ENCOUNTER — Telehealth: Payer: Self-pay | Admitting: Pulmonary Disease

## 2022-01-11 DIAGNOSIS — J432 Centrilobular emphysema: Secondary | ICD-10-CM

## 2022-01-11 MED ORDER — TRELEGY ELLIPTA 100-62.5-25 MCG/ACT IN AEPB: 1.0000 | INHALATION_SPRAY | Freq: Every day | RESPIRATORY_TRACT | 5 refills | Status: DC

## 2022-01-11 NOTE — Telephone Encounter (Signed)
Rx for Trelegy has been sent to preferred pharmacy for pt. Called and spoke with pt letting her know this had been done and she verbalized understanding. Nothing further needed.

## 2022-01-19 DIAGNOSIS — M7542 Impingement syndrome of left shoulder: Secondary | ICD-10-CM | POA: Diagnosis not present

## 2022-01-21 DIAGNOSIS — M7542 Impingement syndrome of left shoulder: Secondary | ICD-10-CM | POA: Diagnosis not present

## 2022-03-03 ENCOUNTER — Ambulatory Visit (INDEPENDENT_AMBULATORY_CARE_PROVIDER_SITE_OTHER): Payer: Medicare Other | Admitting: Pulmonary Disease

## 2022-03-03 ENCOUNTER — Encounter: Payer: Self-pay | Admitting: Pulmonary Disease

## 2022-03-03 VITALS — BP 124/82 | HR 92 | Ht 63.5 in | Wt 227.0 lb

## 2022-03-03 DIAGNOSIS — J432 Centrilobular emphysema: Secondary | ICD-10-CM

## 2022-03-03 LAB — PULMONARY FUNCTION TEST
DL/VA % pred: 77 %
DL/VA: 3.2 ml/min/mmHg/L
DLCO cor % pred: 54 %
DLCO cor: 10.18 ml/min/mmHg
DLCO unc % pred: 54 %
DLCO unc: 10.18 ml/min/mmHg
FEF 25-75 Post: 0.94 L/sec
FEF 25-75 Pre: 0.68 L/sec
FEF2575-%Change-Post: 39 %
FEF2575-%Pred-Post: 57 %
FEF2575-%Pred-Pre: 41 %
FEV1-%Change-Post: 9 %
FEV1-%Pred-Post: 61 %
FEV1-%Pred-Pre: 56 %
FEV1-Post: 1.28 L
FEV1-Pre: 1.17 L
FEV1FVC-%Change-Post: 1 %
FEV1FVC-%Pred-Pre: 89 %
FEV6-%Change-Post: 7 %
FEV6-%Pred-Post: 71 %
FEV6-%Pred-Pre: 66 %
FEV6-Post: 1.87 L
FEV6-Pre: 1.74 L
FEV6FVC-%Change-Post: 0 %
FEV6FVC-%Pred-Post: 105 %
FEV6FVC-%Pred-Pre: 105 %
FVC-%Change-Post: 7 %
FVC-%Pred-Post: 67 %
FVC-%Pred-Pre: 63 %
FVC-Post: 1.87 L
FVC-Pre: 1.74 L
Post FEV1/FVC ratio: 68 %
Post FEV6/FVC ratio: 100 %
Pre FEV1/FVC ratio: 67 %
Pre FEV6/FVC Ratio: 100 %
RV % pred: 170 %
RV: 3.85 L
TLC % pred: 110 %
TLC: 5.48 L

## 2022-03-03 MED ORDER — STIOLTO RESPIMAT 2.5-2.5 MCG/ACT IN AERS: 2.0000 | INHALATION_SPRAY | Freq: Every day | RESPIRATORY_TRACT | 11 refills | Status: DC

## 2022-03-03 NOTE — Patient Instructions (Addendum)
Start stiolto inhaler 2 puffs daily  Your breathing tests show moderate obstruction and mild diffusion defect  We will schedule you for a 6 minute walk test and overnight oximitry test  Follow up in 6 months

## 2022-03-03 NOTE — Progress Notes (Signed)
PFT done today. 

## 2022-03-03 NOTE — Progress Notes (Signed)
Synopsis: Referred in November 2023 for shortness of breath by Glendale Chard, MD  Subjective:   PATIENT ID: Vanessa Ward GENDER: female DOB: [birth year], MRN: [id number]  HPI  Chief Complaint  Patient presents with   Follow-up    F/U after PFT. States the Trelegy caused her throat to be irritated. Did notice a slight improvement with her breathing.    Vanessa Ward is a 77 year old woman, former smoker with GERD, hypertension and CKD who returns to pulmonary clinic for shortness of breath.   She felt some improvement in her shortness of breath with trial of trelegy ellipta 1 puff but it irritated her throat.   PFTs today show moderate obstruction, no significant bronchodilator effect, air trapping and moderate diffusion defect.  Intial OV 12/30/21 She reports developing shortness of breath over recent months. The dyspnea is with exertion. She denies wheezing or cough. She denies night time awakenings due to shortness of breath. She does report snoring. She does feel rested a majority of the time after a full night of sleep. Denies daytime sleepiness. She does not take naps.   She has been evaluated by cardiology with normal echo. Stress test did not show ischemic changes but she has poor exercise capacity and blood pressure was 198/85 during exercise.  She quit smoking 10 years ago. She smoked for 30 years, less than a pack per day. She is enrolled in lung cancer screening through her PCP. 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 She lives alone and has children that live in the area.   Past Medical History:  Diagnosis Date   Arthritis    Chronic kidney disease    resent diagnosis of hematuria due to benigh kidney tumor   GERD (gastroesophageal reflux disease)    Hypertension    Shingles      Family History  Problem Relation Age of Onset   Hypertension Mother    Heart disease  Mother    Hypertension Father    Diabetes Paternal Grandmother      Social History   Socioeconomic History   Marital status: Single    Spouse name: Not on file   Number of children: Not on file   Years of education: Not on file   Highest education level: Not on file  Occupational History   Not on file  Tobacco Use   Smoking status: Former    Packs/day: 0.50    Types: Cigarettes, Pipe    Start date: 52    Quit date: 2016    Years since quitting: 8.0   Smokeless tobacco: Never  Vaping Use   Vaping Use: Never used  Substance and Sexual Activity   Alcohol use: Yes    Alcohol/week: 2.0 standard drinks of alcohol    Types: 2 Standard drinks or equivalent per week   Drug use: No   Sexual activity: Not Currently    Birth control/protection: Post-menopausal  Other Topics Concern   Not on file  Social History Narrative   Not on file   Social Determinants of Health   Financial Resource Strain: Low Risk  (04/22/2021)   Overall Financial Resource Strain (CARDIA)    Difficulty of Paying Living Expenses: Not hard at all  Food Insecurity: No Food Insecurity (04/22/2021)   Hunger Vital Sign    Worried About Running Out of Food in the Last Year: Never true    Ran Out  of Food in the Last Year: Never true  Transportation Needs: No Transportation Needs (04/22/2021)   PRAPARE - Hydrologist (Medical): No    Lack of Transportation (Non-Medical): No  Physical Activity: Inactive (04/22/2021)   Exercise Vital Sign    Days of Exercise per Week: 0 days    Minutes of Exercise per Session: 0 min  Stress: No Stress Concern Present (04/22/2021)   [place]    Feeling of Stress : Not at all  Social Connections: Not on file  Intimate Partner Violence: Not At Risk (12/21/2017)   Humiliation, Afraid, Rape, and Kick questionnaire    Fear of Current or Ex-Partner: No    Emotionally Abused: No    Physically  Abused: No    Sexually Abused: No     Allergies  Allergen Reactions   Other Rash    Perfume, scented lotion     Outpatient Medications Prior to Visit  Medication Sig Dispense Refill   aspirin EC 81 MG tablet Take 81 mg by mouth daily.     Cholecalciferol (DIALYVITE VITAMIN D 5000 PO) Take by mouth.     lisinopril-hydrochlorothiazide (ZESTORETIC) 20-25 MG tablet Take 1 tablet by mouth daily. 90 tablet 2   loratadine (CLARITIN) 10 MG tablet Take 1 tablet (10 mg total) by mouth daily as needed for allergies. 30 tablet 2   omeprazole (PRILOSEC) 40 MG capsule Take 1 capsule (40 mg total) by mouth daily. 90 capsule 2   rosuvastatin (CRESTOR) 10 MG tablet Take 1 tablet (10 mg total) by mouth daily. 90 tablet 2   Fluticasone-Umeclidin-Vilant (TRELEGY ELLIPTA) 100-62.5-25 MCG/ACT AEPB Inhale 1 puff into the lungs daily. 60 each 5   No facility-administered medications prior to visit.    Review of Systems  Constitutional:  Negative for chills, fever, malaise/fatigue and weight loss.  HENT:  Negative for congestion, sinus pain and sore throat.   Eyes: Negative.   Respiratory:  Positive for shortness of breath. Negative for cough, hemoptysis, sputum production and wheezing.   Cardiovascular:  Negative for chest pain, palpitations, orthopnea, claudication and leg swelling.  Gastrointestinal:  Negative for abdominal pain, heartburn, nausea and vomiting.  Genitourinary: Negative.   Musculoskeletal:  Negative for joint pain and myalgias.  Skin:  Negative for rash.  Neurological:  Negative for weakness.  Endo/Heme/Allergies: Negative.   Psychiatric/Behavioral: Negative.     Objective:   Vitals:   03/03/22 1121  BP: 124/82  Pulse: 92  SpO2: 95%  Weight: 227 lb (103 kg)  Height: 5' 3.5" (1.613 m)   Physical Exam Constitutional:      General: She is not in acute distress.    Appearance: She is not ill-appearing.  HENT:     Head: Normocephalic and atraumatic.  Eyes:     General: No  scleral icterus.    Conjunctiva/sclera: Conjunctivae normal.  Cardiovascular:     Rate and Rhythm: Normal rate and regular rhythm.     Pulses: Normal pulses.     Heart sounds: Normal heart sounds. No murmur heard. Pulmonary:     Effort: Pulmonary effort is normal.     Breath sounds: Normal breath sounds. No wheezing, rhonchi or rales.  Musculoskeletal:     Right lower leg: No edema.     Left lower leg: No edema.  Skin:    General: Skin is warm and dry.  Neurological:     General: No focal deficit present.  Mental Status: She is alert.    CBC    Component Value Date/Time   WBC 8.5 12/15/2021 1711   WBC 7.1 12/13/2011 1424   RBC 4.23 12/15/2021 1711   RBC 4.16 04/09/2021 0000   HGB 10.8 (L) 12/15/2021 1711   HCT 34.8 12/15/2021 1711   PLT 330 12/15/2021 1711   MCV 82 12/15/2021 1711   MCH 25.5 (L) 12/15/2021 1711   MCH 26.2 12/13/2011 1424   MCHC 31.0 (L) 12/15/2021 1711   MCHC 31.7 12/13/2011 1424   RDW 13.7 12/15/2021 1711      Latest Ref Rng & Units 12/15/2021    5:11 PM 04/22/2021    3:57 PM 10/09/2020    9:43 AM  BMP  Glucose 70 - 99 mg/dL 86  109  90   BUN 8 - 27 mg/dL '24  25  25   '$ Creatinine 0.57 - 1.00 mg/dL 1.35  1.31  1.19   BUN/Creat Ratio 12 - '28 18  19  21   '$ Sodium 134 - 144 mmol/L 139  142  143   Potassium 3.5 - 5.2 mmol/L 4.2  4.0  4.0   Chloride 96 - 106 mmol/L 103  104  105   CO2 20 - 29 mmol/L '24  23  23   '$ Calcium 8.7 - 10.3 mg/dL 9.1  9.5  9.4    Chest imaging: LCS CT Chest 08/26/21 1. Lung-RADS 1, negative. Continue annual screening with low-dose chest CT without contrast in 12 months. 2. Aortic atherosclerosis. 3. Mild diffuse bronchial wall thickening with very mild centrilobular and paraseptal emphysema; imaging findings suggestive of underlying COPD. 4. Cholelithiasis.  PFT:    Latest Ref Rng & Units 03/03/2022    9:54 AM  PFT Results  FVC-Pre L 1.74   FVC-Predicted Pre % 63   FVC-Post L 1.87   FVC-Predicted Post % 67   Pre  FEV1/FVC % % 67   Post FEV1/FCV % % 68   FEV1-Pre L 1.17   FEV1-Predicted Pre % 56   FEV1-Post L 1.28   DLCO uncorrected ml/min/mmHg 10.18   DLCO UNC% % 54   DLCO corrected ml/min/mmHg 10.18   DLCO COR %Predicted % 54   DLVA Predicted % 77   TLC L 5.48   TLC % Predicted % 110   RV % Predicted % 170   PFT 2024: moderate obstruction, no significant bronchodilator effect, air trapping and moderate diffusion defect.  Labs:  Path:  Echo 08/21/20:  1. Left ventricular ejection fraction, by estimation, is 60 to 65%. The  left ventricle has normal function. The left ventricle has no regional  wall motion abnormalities. Left ventricular diastolic parameters were  normal. The average left ventricular  global longitudinal strain is -22.9 %. The global longitudinal strain is  normal.   2. Right ventricular systolic function is normal. The right ventricular  size is normal. Tricuspid regurgitation signal is inadequate for assessing  PA pressure.   3. The mitral valve is grossly normal. No evidence of mitral valve  regurgitation. No evidence of mitral stenosis.   4. The aortic valve is tricuspid. Aortic valve regurgitation is not  visualized. No aortic stenosis is present.   5. The inferior vena cava is normal in size with greater than 50%  respiratory variability, suggesting right atrial pressure of 3 mmHg.  Heart Catheterization:  Assessment & Plan:   Centrilobular emphysema ([place]) - Plan: Pulse oximetry, overnight, Tiotropium Bromide-Olodaterol (STIOLTO RESPIMAT) 2.5-2.5 MCG/ACT AERS  Discussion: Vanessa Ward  is a 77 year old woman, former smoker with GERD, hypertension and CKD who returns to pulmonary clinic for COPD.   She has centrilobular emphysema based on CT Chest scan and moderate obstruction, no significant bronchodilator effect, air trapping and moderate diffusion defect based on PFTs today.   We will try her on stiolto 2 puffs daily as trelegy irritated her throat.   We  will check a 6MWT.   Follow up in 6 months.  Freda Jackson, MD [facility] Office: [phone]   Current Outpatient Medications:    aspirin EC 81 MG tablet, Take 81 mg by mouth daily., Disp: , Rfl:    Cholecalciferol (DIALYVITE VITAMIN D 5000 PO), Take by mouth., Disp: , Rfl:    lisinopril-hydrochlorothiazide (ZESTORETIC) 20-25 MG tablet, Take 1 tablet by mouth daily., Disp: 90 tablet, Rfl: 2   loratadine (CLARITIN) 10 MG tablet, Take 1 tablet (10 mg total) by mouth daily as needed for allergies., Disp: 30 tablet, Rfl: 2   omeprazole (PRILOSEC) 40 MG capsule, Take 1 capsule (40 mg total) by mouth daily., Disp: 90 capsule, Rfl: 2   rosuvastatin (CRESTOR) 10 MG tablet, Take 1 tablet (10 mg total) by mouth daily., Disp: 90 tablet, Rfl: 2   Tiotropium Bromide-Olodaterol (STIOLTO RESPIMAT) 2.5-2.5 MCG/ACT AERS, Inhale 2 puffs into the lungs daily., Disp: 4 g, Rfl: 11

## 2022-03-07 ENCOUNTER — Encounter: Payer: Self-pay | Admitting: Pulmonary Disease

## 2022-03-15 ENCOUNTER — Ambulatory Visit: Payer: Medicare Other

## 2022-03-15 DIAGNOSIS — R0683 Snoring: Secondary | ICD-10-CM | POA: Diagnosis not present

## 2022-03-15 DIAGNOSIS — G4730 Sleep apnea, unspecified: Secondary | ICD-10-CM | POA: Diagnosis not present

## 2022-03-17 ENCOUNTER — Ambulatory Visit (INDEPENDENT_AMBULATORY_CARE_PROVIDER_SITE_OTHER): Payer: Medicare Other | Admitting: Pulmonary Disease

## 2022-03-17 DIAGNOSIS — J432 Centrilobular emphysema: Secondary | ICD-10-CM

## 2022-03-17 NOTE — Progress Notes (Signed)
Six Minute Walk - 03/17/22 1438       Six Minute Walk   Medications taken before test (dose and time) aspirin '81mg'$ , vitamin d, lisinopril-hydrochlorothiazide 20-'25mg'$ , omeprazole '40mg'$ , rosuvastatin '10mg'$ , Stiolto Respimat at 12:00    Supplemental oxygen during test? No    Lap distance in meters  34 meters    Laps Completed 13    Partial lap (in meters) 0 meters    Baseline BP (sitting) 122/80    Baseline Heartrate 75    Baseline Dyspnea (Borg Scale) 2    Baseline Fatigue (Borg Scale) 0    Baseline SPO2 97 %      End of Test Values    BP (sitting) 150/94    Heartrate 124    Dyspnea (Borg Scale) 91    Fatigue (Borg Scale) 2    SPO2 2 %      2 Minutes Post Walk Values   BP (sitting) 138/84    Heartrate 92    SPO2 98 %    Stopped or paused before six minutes? No      Interpretation   Distance completed 442 meters    Tech Comments: average pace, completed entire 6 minutes without stopping, did not desat to where she required any oxygen

## 2022-03-29 ENCOUNTER — Other Ambulatory Visit: Payer: Self-pay | Admitting: Internal Medicine

## 2022-04-06 ENCOUNTER — Telehealth: Payer: Self-pay | Admitting: Pulmonary Disease

## 2022-04-06 NOTE — Telephone Encounter (Signed)
Please let patient know her ONO showed she spent 1 min 56 seconds with an SpO2 less than 88%.   She does not qualify for supplemental oxygen at night.  Thanks, JD

## 2022-04-11 ENCOUNTER — Other Ambulatory Visit: Payer: Self-pay | Admitting: Internal Medicine

## 2022-04-11 DIAGNOSIS — K219 Gastro-esophageal reflux disease without esophagitis: Secondary | ICD-10-CM

## 2022-04-14 NOTE — Telephone Encounter (Signed)
Left message for patient to call back  

## 2022-04-14 NOTE — Telephone Encounter (Signed)
PT ret call. I read the notes from Dr. Erin Fulling. That is all she needed to know. She was good with that information. I will sign off on this encounter.

## 2022-05-05 ENCOUNTER — Ambulatory Visit (INDEPENDENT_AMBULATORY_CARE_PROVIDER_SITE_OTHER): Payer: Medicare Other | Admitting: Internal Medicine

## 2022-05-05 ENCOUNTER — Encounter: Payer: Self-pay | Admitting: Internal Medicine

## 2022-05-05 ENCOUNTER — Ambulatory Visit (INDEPENDENT_AMBULATORY_CARE_PROVIDER_SITE_OTHER): Payer: Medicare Other

## 2022-05-05 VITALS — BP 128/78 | HR 85 | Temp 98.4°F | Ht 66.0 in | Wt 230.8 lb

## 2022-05-05 VITALS — BP 128/78 | HR 85 | Temp 98.4°F | Ht 66.0 in | Wt 230.0 lb

## 2022-05-05 DIAGNOSIS — I1310 Hypertensive heart and chronic kidney disease without heart failure, with stage 1 through stage 4 chronic kidney disease, or unspecified chronic kidney disease: Secondary | ICD-10-CM | POA: Diagnosis not present

## 2022-05-05 DIAGNOSIS — Z6837 Body mass index (BMI) 37.0-37.9, adult: Secondary | ICD-10-CM

## 2022-05-05 DIAGNOSIS — E559 Vitamin D deficiency, unspecified: Secondary | ICD-10-CM | POA: Diagnosis not present

## 2022-05-05 DIAGNOSIS — I129 Hypertensive chronic kidney disease with stage 1 through stage 4 chronic kidney disease, or unspecified chronic kidney disease: Secondary | ICD-10-CM

## 2022-05-05 DIAGNOSIS — N1831 Chronic kidney disease, stage 3a: Secondary | ICD-10-CM | POA: Diagnosis not present

## 2022-05-05 DIAGNOSIS — G8929 Other chronic pain: Secondary | ICD-10-CM | POA: Diagnosis not present

## 2022-05-05 DIAGNOSIS — F17211 Nicotine dependence, cigarettes, in remission: Secondary | ICD-10-CM

## 2022-05-05 DIAGNOSIS — Z0000 Encounter for general adult medical examination without abnormal findings: Secondary | ICD-10-CM | POA: Diagnosis not present

## 2022-05-05 DIAGNOSIS — I700 Atherosclerosis of aorta: Secondary | ICD-10-CM | POA: Diagnosis not present

## 2022-05-05 DIAGNOSIS — M25512 Pain in left shoulder: Secondary | ICD-10-CM | POA: Diagnosis not present

## 2022-05-05 DIAGNOSIS — M7918 Myalgia, other site: Secondary | ICD-10-CM

## 2022-05-05 DIAGNOSIS — R7309 Other abnormal glucose: Secondary | ICD-10-CM | POA: Diagnosis not present

## 2022-05-05 NOTE — Progress Notes (Signed)
I,Vanessa Ward,acting as a scribe for Vanessa Greenland, MD.,have documented all relevant documentation on the behalf of Vanessa Greenland, MD,as directed by  Vanessa Greenland, MD while in the presence of Vanessa Greenland, MD.    Subjective:     Patient ID: Vanessa Ward , female    DOB: [birth year] , 77 y.o.   MRN: [id number]   Chief Complaint  Patient presents with   Hypertension    HPI  Patient presents today for a bp check. She reports compliance with meds. She denies headaches, chest pain and palpitations. Patient states she has soreness in her butt for the past week.   [facility] Advisor performed AWV earlier today.   BP Readings from Last 3 Encounters: 05/05/22 : 128/78 05/05/22 : 128/78 03/03/22 : 124/82    Hypertension This is a chronic problem. The current episode started more than 1 year ago. The problem has been gradually improving since onset. The problem is controlled. Pertinent negatives include no blurred vision or palpitations. Risk factors for coronary artery disease include dyslipidemia, obesity, post-menopausal state and sedentary lifestyle. Past treatments include ACE inhibitors and diuretics. The current treatment provides moderate improvement. Compliance problems include exercise.  Hypertensive end-organ damage includes kidney disease.     Past Medical History:  Diagnosis Date   Arthritis    Chronic kidney disease    resent diagnosis of hematuria due to benigh kidney tumor   GERD (gastroesophageal reflux disease)    Hypertension    Shingles      Family History  Problem Relation Age of Onset   Hypertension Mother    Heart disease Mother    Hypertension Father    Diabetes Paternal Grandmother      Current Outpatient Medications:    aspirin EC 81 MG tablet, Take 81 mg by mouth daily., Disp: , Rfl:    Cholecalciferol (DIALYVITE VITAMIN D 5000 PO), Take by mouth., Disp: , Rfl:    lisinopril-hydrochlorothiazide (ZESTORETIC) 20-25 MG tablet, Take 1  tablet by mouth once daily, Disp: 90 tablet, Rfl: 0   loratadine (CLARITIN) 10 MG tablet, Take 1 tablet (10 mg total) by mouth daily as needed for allergies., Disp: 30 tablet, Rfl: 2   omeprazole (PRILOSEC) 40 MG capsule, Take 1 capsule by mouth once daily, Disp: 90 capsule, Rfl: 0   rosuvastatin (CRESTOR) 10 MG tablet, Take 1 tablet (10 mg total) by mouth daily., Disp: 90 tablet, Rfl: 2   Tiotropium Bromide-Olodaterol (STIOLTO RESPIMAT) 2.5-2.5 MCG/ACT AERS, Inhale 2 puffs into the lungs daily., Disp: 4 g, Rfl: 11   Allergies  Allergen Reactions   Other Rash    Perfume, scented lotion     Review of Systems  Constitutional: Negative.   HENT: Negative.    Eyes:  Negative for blurred vision.  Respiratory: Negative.    Cardiovascular: Negative.  Negative for palpitations.  Gastrointestinal: Negative.   Musculoskeletal:  Positive for arthralgias and back pain.  Skin: Negative.   Neurological: Negative.   Psychiatric/Behavioral: Negative.       Today's Vitals   05/05/22 1455  BP: 128/78  Pulse: 85  Temp: 98.4 F (36.9 C)  SpO2: 98%  Weight: 230 lb (104.3 kg)  Height: 5\' 6"  (1.676 m)   Body mass index is 37.12 kg/m.  Wt Readings from Last 3 Encounters:  05/05/22 230 lb (104.3 kg)  05/05/22 230 lb 12.8 oz (104.7 kg)  03/03/22 227 lb (103 kg)    Objective:  Physical Exam Vitals and nursing note  reviewed.  Constitutional:      Appearance: Normal appearance. She is obese.  HENT:     Head: Normocephalic and atraumatic.     Nose:     Comments: Masked     Mouth/Throat:     Comments: Masked  Eyes:     Extraocular Movements: Extraocular movements intact.  Cardiovascular:     Rate and Rhythm: Normal rate and regular rhythm.     Heart sounds: Normal heart sounds.  Pulmonary:     Effort: Pulmonary effort is normal.     Breath sounds: Normal breath sounds.  Musculoskeletal:        General: Tenderness present.     Cervical back: Normal range of motion.     Right lower leg:  No edema.     Left lower leg: No edema.  Skin:    General: Skin is warm.  Neurological:     General: No focal deficit present.     Mental Status: She is alert.  Psychiatric:        Mood and Affect: Mood normal.        Behavior: Behavior normal.     Assessment And Plan:     1. Hypertensive heart and renal disease with renal failure, stage 1 through stage 4 or unspecified chronic kidney disease, without heart failure Comments: Chronic, conrolled. She will c/w lisinopril/hctz 20/25mg  daily.  She is encouraged to follow low sodium diet. She will f/u in 4-6 months. - CMP14+EGFR - CBC - Lipid panel  2. Atherosclerosis of aorta (HCC) Comments: Chronic ,LDL goal < 70.  She will c/w ASA 79m and rosuvastatin 10mg  daily. She is encouraged to follow a heart healthy lifestyle.  3. Stage 3a chronic kidney disease ([place]) Comments: Chronic, she is reminded to avoid NSAIDs, stay well hydrated and keep BP well controlled to decrease risk of CKD progression. - CMP14+EGFR - Vitamin D (25 hydroxy) - Microalbumin / Creatinine Urine Ratio - PTH, intact and calcium - Phosphorus - Protein electrophoresis, serum  4. Bilateral buttock pain Comments: Possibly due to lumbar spine disease. She is advised to apply topical pain rub to b/l lower back nightly. If persistent, will consider rx muscle relaxer prn.  5. Other abnormal glucose Comments: Previous labs reviewed, a1c has been elevated in the past. I will recheck an a1c today. She is encouraged to decrease intake of sugary foods/drinks. - Hemoglobin A1c  6. Vitamin D deficiency disease Comments: I will check a vitamin D level and supplement as needed. - Vitamin D (25 hydroxy)  7. Chronic left shoulder pain Comments: She is advised to apply topical pain rub to affected area bid prn. If persistent, she needs Ortho eval for further evaluation and radiographic studies.  8. Cigarette nicotine dependence in remission Comments: I will schedule repeat  LDCT in July 2024. - CT CHEST LUNG CA SCREEN LOW DOSE W/O CM; Future  9. Class 2 severe obesity due to excess calories with serious comorbidity and body mass index (BMI) of 37.0 to 37.9 in adult [facility]) Comments: She is encouraged to aim for at least 150 minutes of exercise/week, while striving for BMI<30 to decrease cardiac risk.   Patient was given opportunity to ask questions. Patient verbalized understanding of the plan and was able to repeat key elements of the plan. All questions were answered to their satisfaction.   I, Vanessa Greenland, MD, have reviewed all documentation for this visit. The documentation on 05/05/22 for the exam, diagnosis, procedures, and orders are all accurate and  complete.   IF YOU HAVE BEEN REFERRED TO A SPECIALIST, IT MAY TAKE 1-2 WEEKS TO SCHEDULE/PROCESS THE REFERRAL. IF YOU HAVE NOT HEARD FROM US/SPECIALIST IN TWO WEEKS, PLEASE GIVE [place] A CALL AT [phone].   THE PATIENT IS ENCOURAGED TO PRACTICE SOCIAL DISTANCING DUE TO THE COVID-19 PANDEMIC.

## 2022-05-05 NOTE — Progress Notes (Signed)
Subjective:   Vanessa Ward is a 77 y.o. female who presents for Medicare Annual (Subsequent) preventive examination.  Review of Systems     Cardiac Risk Factors include: advanced age (>25men, >79 women);dyslipidemia;hypertension;obesity (BMI >30kg/m2)     Objective:    Today's Vitals   05/05/22 1434  BP: 128/78  Pulse: 85  Temp: 98.4 F (36.9 C)  TempSrc: Oral  SpO2: 96%  Weight: 230 lb 12.8 oz (104.7 kg)  Height: 5\' 6"  (1.676 m)   Body mass index is 37.25 kg/m.     05/05/2022    2:46 PM 04/22/2021    2:43 PM 01/03/2020    2:43 PM 12/27/2018   10:46 AM 12/21/2017   11:02 AM 02/06/2014   10:11 AM 12/13/2011    2:11 PM  Advanced Directives  Does Patient Have a Medical Advance Directive? No No No No No No Patient does not have advance directive  Would patient like information on creating a medical advance directive? No - Patient declined No - Patient declined No - Patient declined  Yes (MAU/Ambulatory/Procedural Areas - Information given) No - patient declined information     Current Medications (verified) Outpatient Encounter Medications as of 05/05/2022  Medication Sig   aspirin EC 81 MG tablet Take 81 mg by mouth daily.   Cholecalciferol (DIALYVITE VITAMIN D 5000 PO) Take by mouth.   lisinopril-hydrochlorothiazide (ZESTORETIC) 20-25 MG tablet Take 1 tablet by mouth once daily   omeprazole (PRILOSEC) 40 MG capsule Take 1 capsule by mouth once daily   rosuvastatin (CRESTOR) 10 MG tablet Take 1 tablet (10 mg total) by mouth daily.   Tiotropium Bromide-Olodaterol (STIOLTO RESPIMAT) 2.5-2.5 MCG/ACT AERS Inhale 2 puffs into the lungs daily.   loratadine (CLARITIN) 10 MG tablet Take 1 tablet (10 mg total) by mouth daily as needed for allergies.   No facility-administered encounter medications on file as of 05/05/2022.    Allergies (verified) Other   History: Past Medical History:  Diagnosis Date   Arthritis    Chronic kidney disease    resent diagnosis of  hematuria due to benigh kidney tumor   GERD (gastroesophageal reflux disease)    Hypertension    Shingles    Past Surgical History:  Procedure Laterality Date   BIOPSY  10/15/2011   Procedure: BIOPSY;  Surgeon: Lahoma Crocker, MD;  Location: [facility];  Service: Gynecology;;  Cervical   CERVICAL CONIZATION W/BX  12/17/2011   Procedure: CONIZATION CERVIX WITH BIOPSY;  Surgeon: Lahoma Crocker, MD;  Location: [facility];  Service: Gynecology;  Laterality: N/A;   COLPOSCOPY  10/15/2011   Procedure: COLPOSCOPY;  Surgeon: Lahoma Crocker, MD;  Location: [facility];  Service: Gynecology;  Laterality: N/A;   DIAGNOSTIC LAPAROSCOPY     TUBAL LIGATION     Family History  Problem Relation Age of Onset   Hypertension Mother    Heart disease Mother    Hypertension Father    Diabetes Paternal Grandmother    Social History   Socioeconomic History   Marital status: Single    Spouse name: Not on file   Number of children: Not on file   Years of education: Not on file   Highest education level: Not on file  Occupational History   Not on file  Tobacco Use   Smoking status: Former    Packs/day: .5    Types: Cigarettes, Pipe    Start date: 68    Quit date: 2016    Years since quitting: 8.2   Smokeless  tobacco: Never  Vaping Use   Vaping Use: Never used  Substance and Sexual Activity   Alcohol use: Yes    Alcohol/week: 2.0 standard drinks of alcohol    Types: 2 Standard drinks or equivalent per week   Drug use: No   Sexual activity: Not Currently    Birth control/protection: Post-menopausal  Other Topics Concern   Not on file  Social History Narrative   Not on file   Social Determinants of Health   Financial Resource Strain: Low Risk  (05/05/2022)   Overall Financial Resource Strain (CARDIA)    Difficulty of Paying Living Expenses: Not hard at all  Food Insecurity: No Food Insecurity (05/05/2022)   Hunger Vital Sign    Worried About Running Out of Food in the Last Year: Never  true    Ran Out of Food in the Last Year: Never true  Transportation Needs: No Transportation Needs (05/05/2022)   PRAPARE - Hydrologist (Medical): No    Lack of Transportation (Non-Medical): No  Physical Activity: Inactive (05/05/2022)   Exercise Vital Sign    Days of Exercise per Week: 0 days    Minutes of Exercise per Session: 0 min  Stress: No Stress Concern Present (05/05/2022)   [place]    Feeling of Stress : Not at all  Social Connections: Not on file    Tobacco Counseling Counseling given: Not Answered   Clinical Intake:  Pre-visit preparation completed: Yes  Pain : No/denies pain     Nutritional Status: BMI > 30  Obese Nutritional Risks: None Diabetes: No  How often do you need to have someone help you when you read instructions, pamphlets, or other written materials from your doctor or pharmacy?: 1 - Never  Diabetic? no  Interpreter Needed?: No  Information entered by :: NAllen LPN   Activities of Daily Living    05/05/2022    2:50 PM  In your present state of health, do you have any difficulty performing the following activities:  Hearing? 0  Vision? 0  Difficulty concentrating or making decisions? 1  Comment names and words coming to  Walking or climbing stairs? 0  Dressing or bathing? 0  Doing errands, shopping? 0  Preparing Food and eating ? N  Using the Toilet? N  In the past six months, have you accidently leaked urine? N  Do you have problems with loss of bowel control? N  Managing your Medications? N  Managing your Finances? N  Housekeeping or managing your Housekeeping? N    Patient Care Team: Glendale Chard, MD as PCP - General (Internal Medicine) Buford Dresser, MD as PCP - Cardiology (Cardiology)  Indicate any recent Medical Services you may have received from other than [facility] providers in the past year (date may be  approximate).     Assessment:   This is a routine wellness examination for Vanessa Ward.  Hearing/Vision screen Vision Screening - Comments:: No regular eye exams, [facility]  Dietary issues and exercise activities discussed: Current Exercise Habits: The patient does not participate in regular exercise at present   Goals Addressed             This Visit's Progress    Patient Stated       05/05/2022, wants to remain mobile       Depression Screen    05/05/2022    2:50 PM 12/15/2021    3:59 PM 04/22/2021  2:44 PM 01/03/2020    2:43 PM 01/03/2020    2:08 PM 12/27/2018   10:47 AM 08/02/2018   10:30 AM  PHQ 2/9 Scores  PHQ - 2 Score 0 0 0 0 0 0 0    Fall Risk    05/05/2022    2:49 PM 12/15/2021    3:59 PM 04/22/2021    2:43 PM 01/03/2020    2:43 PM 01/03/2020    2:08 PM  [place] in the past year? 0 1 0 0 0  Number falls in past yr: 0 0     Injury with Fall? 0 0     Risk for fall due to : Medication side effect No Fall Risks Medication side effect Medication side effect   Follow up Falls prevention discussed;Education provided;Falls evaluation completed Falls evaluation completed Falls evaluation completed;Education provided;Falls prevention discussed Falls evaluation completed;Education provided;Falls prevention discussed      FALL RISK PREVENTION PERTAINING TO THE HOME:  Any stairs in or around the home? Yes  If so, are there any without handrails? Yes  Home free of loose throw rugs in walkways, pet beds, electrical cords, etc? Yes  Adequate lighting in your home to reduce risk of falls? Yes   ASSISTIVE DEVICES UTILIZED TO PREVENT FALLS:  Life alert? No  Use of a cane, walker or w/c? No  Grab bars in the bathroom? No  Shower chair or bench in shower? No  Elevated toilet seat or a handicapped toilet? Yes   TIMED UP AND GO:  Was the test performed? Yes .  Length of time to ambulate 10 feet: 5 sec.   Gait steady and fast without use of assistive  device  Cognitive Function:        05/05/2022    2:52 PM 04/22/2021    2:48 PM 01/03/2020    2:45 PM 12/27/2018   10:49 AM 12/21/2017   11:05 AM  6CIT Screen  What Year? 0 points 0 points 0 points 0 points 0 points  What month? 0 points 0 points 0 points 0 points 0 points  What time? 0 points 0 points 0 points 0 points 3 points  Count back from 20 0 points 0 points 0 points 0 points 0 points  Months in reverse 0 points 0 points 0 points 0 points 0 points  Repeat phrase 0 points 0 points 0 points 2 points 0 points  Total Score 0 points 0 points 0 points 2 points 3 points    Immunizations Immunization History  Administered Date(s) Administered   Fluad Quad(high Dose 65+) 01/15/2020, 10/09/2020   Influenza, High Dose Seasonal PF 12/21/2017, 12/27/2018   Influenza-Unspecified 11/18/2021   Moderna Covid-19 Vaccine Bivalent Booster 84yrs & up 11/18/2021   PFIZER(Purple Top)SARS-COV-2 Vaccination 04/12/2019, 05/08/2019, 12/01/2019   Pneumococcal Conjugate-13 07/12/2013   Pneumococcal Polysaccharide-23 04/16/2010   Tdap 06/12/2012   Zoster Recombinat (Shingrix) 10/09/2020, 04/04/2021   Zoster, Live 09/25/2013    TDAP status: Up to date  Flu Vaccine status: Up to date  Pneumococcal vaccine status: Up to date  Covid-19 vaccine status: Completed vaccines  Qualifies for Shingles Vaccine? Yes   Zostavax completed Yes   Shingrix Completed?: Yes  Screening Tests Health Maintenance  Topic Date Due   COVID-19 Vaccine (5 - 2023-24 season) 01/13/2022   Pneumonia Vaccine 8+ Years old (3 of 3 - PPSV23 or PCV20) 12/16/2022 (Originally 04/16/2015)   DTaP/Tdap/Td (2 - Td or Tdap) 06/13/2022   Medicare Annual Wellness (AWV)  05/05/2023   Fecal DNA (Cologuard)  05/06/2024   INFLUENZA VACCINE  Completed   DEXA SCAN  Completed   Hepatitis C Screening  Completed   Zoster Vaccines- Shingrix  Completed   HPV VACCINES  Aged Out   COLONOSCOPY (Pts 45-22yrs Insurance coverage will need to be  confirmed)  Discontinued    Health Maintenance  Health Maintenance Due  Topic Date Due   COVID-19 Vaccine (5 - 2023-24 season) 01/13/2022    Colorectal cancer screening: Type of screening: Cologuard. Completed 05/06/2021. Repeat every 3 years  Mammogram status: Completed 11/02/2021. Repeat every year  Bone Density status: Completed 12/08/2016.  Lung Cancer Screening: (Low Dose CT Chest recommended if Age 86-80 years, 30 pack-year currently smoking OR have quit w/in 15years.) does qualify.   Lung Cancer Screening Referral: CT scan 08/28/2021  Additional Screening:  Hepatitis C Screening: does qualify; Completed 08/02/2018  Vision Screening: Recommended annual ophthalmology exams for early detection of glaucoma and other disorders of the eye. Is the patient up to date with their annual eye exam?  Yes  Who is the provider or what is the name of the office in which the patient attends annual eye exams? [facility] If pt is not established with a provider, would they like to be referred to a provider to establish care? No .   Dental Screening: Recommended annual dental exams for proper oral hygiene  Community Resource Referral / Chronic Care Management: CRR required this visit?  No   CCM required this visit?  No      Plan:     I have personally reviewed and noted the following in the patient's chart:   Medical and social history Use of alcohol, tobacco or illicit drugs  Current medications and supplements including opioid prescriptions. Patient is not currently taking opioid prescriptions. Functional ability and status Nutritional status Physical activity Advanced directives List of other physicians Hospitalizations, surgeries, and ER visits in previous 12 months Vitals Screenings to include cognitive, depression, and falls Referrals and appointments  In addition, I have reviewed and discussed with patient certain preventive protocols, quality metrics, and best  practice recommendations. A written personalized care plan for preventive services as well as general preventive health recommendations were provided to patient.     Kellie Simmering, LPN   [id number]   Nurse Notes: none

## 2022-05-05 NOTE — Patient Instructions (Signed)
Vanessa Ward , Thank you for taking time to come for your Medicare Wellness Visit. I appreciate your ongoing commitment to your health goals. Please review the following plan we discussed and let me know if I can assist you in the future.   These are the goals we discussed:  Goals      Patient Stated     12/27/2018, no goals set at this time     Patient Stated     01/03/2020, no goals     Patient Stated     04/22/2021, get SOB under control     Patient Stated     05/05/2022, wants to remain mobile        This is a list of the screening recommended for you and due dates:  Health Maintenance  Topic Date Due   COVID-19 Vaccine (5 - 2023-24 season) 01/13/2022   Pneumonia Vaccine (3 of 3 - PPSV23 or PCV20) 12/16/2022*   DTaP/Tdap/Td vaccine (2 - Td or Tdap) 06/13/2022   Medicare Annual Wellness Visit  05/05/2023   Cologuard (Stool DNA test)  05/06/2024   Flu Shot  Completed   DEXA scan (bone density measurement)  Completed   Hepatitis C Screening: USPSTF Recommendation to screen - Ages 71-79 yo.  Completed   Zoster (Shingles) Vaccine  Completed   HPV Vaccine  Aged Out   Colon Cancer Screening  Discontinued  *Topic was postponed. The date shown is not the original due date.    Advanced directives: Advance directive discussed with you today. Even though you declined this today please call our office should you change your mind and we can give you the proper paperwork for you to fill out.  Conditions/risks identified: none  Next appointment: Follow up in one year for your annual wellness visit    Preventive Care 65 Years and Older, Female Preventive care refers to lifestyle choices and visits with your health care provider that can promote health and wellness. What does preventive care include? A yearly physical exam. This is also called an annual well check. Dental exams once or twice a year. Routine eye exams. Ask your health care provider how often you should have your eyes  checked. Personal lifestyle choices, including: Daily care of your teeth and gums. Regular physical activity. Eating a healthy diet. Avoiding tobacco and drug use. Limiting alcohol use. Practicing safe sex. Taking low-dose aspirin every day. Taking vitamin and mineral supplements as recommended by your health care provider. What happens during an annual well check? The services and screenings done by your health care provider during your annual well check will depend on your age, overall health, lifestyle risk factors, and family history of disease. Counseling  Your health care provider may ask you questions about your: Alcohol use. Tobacco use. Drug use. Emotional well-being. Home and relationship well-being. Sexual activity. Eating habits. History of falls. Memory and ability to understand (cognition). Work and work Statistician. Reproductive health. Screening  You may have the following tests or measurements: Height, weight, and BMI. Blood pressure. Lipid and cholesterol levels. These may be checked every 5 years, or more frequently if you are over 67 years old. Skin check. Lung cancer screening. You may have this screening every year starting at age 1 if you have a 30-pack-year history of smoking and currently smoke or have quit within the past 15 years. Fecal occult blood test (FOBT) of the stool. You may have this test every year starting at age 79. Flexible sigmoidoscopy or colonoscopy. You  may have a sigmoidoscopy every 5 years or a colonoscopy every 10 years starting at age 58. Hepatitis C blood test. Hepatitis B blood test. Sexually transmitted disease (STD) testing. Diabetes screening. This is done by checking your blood sugar (glucose) after you have not eaten for a while (fasting). You may have this done every 1-3 years. Bone density scan. This is done to screen for osteoporosis. You may have this done starting at age 27. Mammogram. This may be done every 1-2  years. Talk to your health care provider about how often you should have regular mammograms. Talk with your health care provider about your test results, treatment options, and if necessary, the need for more tests. Vaccines  Your health care provider may recommend certain vaccines, such as: Influenza vaccine. This is recommended every year. Tetanus, diphtheria, and acellular pertussis (Tdap, Td) vaccine. You may need a Td booster every 10 years. Zoster vaccine. You may need this after age 25. Pneumococcal 13-valent conjugate (PCV13) vaccine. One dose is recommended after age 58. Pneumococcal polysaccharide (PPSV23) vaccine. One dose is recommended after age 51. Talk to your health care provider about which screenings and vaccines you need and how often you need them. This information is not intended to replace advice given to you by your health care provider. Make sure you discuss any questions you have with your health care provider. Document Released: 02/28/2015 Document Revised: 10/22/2015 Document Reviewed: 12/03/2014 Elsevier Interactive Patient Education  2017 [place] Prevention in the Home Falls can cause injuries. They can happen to people of all ages. There are many things you can do to make your home safe and to help prevent falls. What can I do on the outside of my home? Regularly fix the edges of walkways and driveways and fix any cracks. Remove anything that might make you trip as you walk through a door, such as a raised step or threshold. Trim any bushes or trees on the path to your home. Use bright outdoor lighting. Clear any walking paths of anything that might make someone trip, such as rocks or tools. Regularly check to see if handrails are loose or broken. Make sure that both sides of any steps have handrails. Any raised decks and porches should have guardrails on the edges. Have any leaves, snow, or ice cleared regularly. Use sand or salt on walking paths  during winter. Clean up any spills in your garage right away. This includes oil or grease spills. What can I do in the bathroom? Use night lights. Install grab bars by the toilet and in the tub and shower. Do not use towel bars as grab bars. Use non-skid mats or decals in the tub or shower. If you need to sit down in the shower, use a plastic, non-slip stool. Keep the floor dry. Clean up any water that spills on the floor as soon as it happens. Remove soap buildup in the tub or shower regularly. Attach bath mats securely with double-sided non-slip rug tape. Do not have throw rugs and other things on the floor that can make you trip. What can I do in the bedroom? Use night lights. Make sure that you have a light by your bed that is easy to reach. Do not use any sheets or blankets that are too big for your bed. They should not hang down onto the floor. Have a firm chair that has side arms. You can use this for support while you get dressed. Do not have throw  rugs and other things on the floor that can make you trip. What can I do in the kitchen? Clean up any spills right away. Avoid walking on wet floors. Keep items that you use a lot in easy-to-reach places. If you need to reach something above you, use a strong step stool that has a grab bar. Keep electrical cords out of the way. Do not use floor polish or wax that makes floors slippery. If you must use wax, use non-skid floor wax. Do not have throw rugs and other things on the floor that can make you trip. What can I do with my stairs? Do not leave any items on the stairs. Make sure that there are handrails on both sides of the stairs and use them. Fix handrails that are broken or loose. Make sure that handrails are as long as the stairways. Check any carpeting to make sure that it is firmly attached to the stairs. Fix any carpet that is loose or worn. Avoid having throw rugs at the top or bottom of the stairs. If you do have throw  rugs, attach them to the floor with carpet tape. Make sure that you have a light switch at the top of the stairs and the bottom of the stairs. If you do not have them, ask someone to add them for you. What else can I do to help prevent falls? Wear shoes that: Do not have high heels. Have rubber bottoms. Are comfortable and fit you well. Are closed at the toe. Do not wear sandals. If you use a stepladder: Make sure that it is fully opened. Do not climb a closed stepladder. Make sure that both sides of the stepladder are locked into place. Ask someone to hold it for you, if possible. Clearly mark and make sure that you can see: Any grab bars or handrails. First and last steps. Where the edge of each step is. Use tools that help you move around (mobility aids) if they are needed. These include: Canes. Walkers. Scooters. Crutches. Turn on the lights when you go into a dark area. Replace any light bulbs as soon as they burn out. Set up your furniture so you have a clear path. Avoid moving your furniture around. If any of your floors are uneven, fix them. If there are any pets around you, be aware of where they are. Review your medicines with your doctor. Some medicines can make you feel dizzy. This can increase your chance of falling. Ask your doctor what other things that you can do to help prevent falls. This information is not intended to replace advice given to you by your health care provider. Make sure you discuss any questions you have with your health care provider. Document Released: 11/28/2008 Document Revised: 07/10/2015 Document Reviewed: 03/08/2014 Elsevier Interactive Patient Education  2017 [employer].

## 2022-05-05 NOTE — Patient Instructions (Signed)
Hypertension, Adult ?Hypertension is another name for high blood pressure. High blood pressure forces your heart to work harder to pump blood. This can cause problems over time. ?There are two numbers in a blood pressure reading. There is a top number (systolic) over a bottom number (diastolic). It is best to have a blood pressure that is below 120/80. ?What are the causes? ?The cause of this condition is not known. Some other conditions can lead to high blood pressure. ?What increases the risk? ?Some lifestyle factors can make you more likely to develop high blood pressure: ?Smoking. ?Not getting enough exercise or physical activity. ?Being overweight. ?Having too much fat, sugar, calories, or salt (sodium) in your diet. ?Drinking too much alcohol. ?Other risk factors include: ?Having any of these conditions: ?Heart disease. ?Diabetes. ?High cholesterol. ?Kidney disease. ?Obstructive sleep apnea. ?Having a family history of high blood pressure and high cholesterol. ?Age. The risk increases with age. ?Stress. ?What are the signs or symptoms? ?High blood pressure may not cause symptoms. Very high blood pressure (hypertensive crisis) may cause: ?Headache. ?Fast or uneven heartbeats (palpitations). ?Shortness of breath. ?Nosebleed. ?Vomiting or feeling like you may vomit (nauseous). ?Changes in how you see. ?Very bad chest pain. ?Feeling dizzy. ?Seizures. ?How is this treated? ?This condition is treated by making healthy lifestyle changes, such as: ?Eating healthy foods. ?Exercising more. ?Drinking less alcohol. ?Your doctor may prescribe medicine if lifestyle changes do not help enough and if: ?Your top number is above 130. ?Your bottom number is above 80. ?Your personal target blood pressure may vary. ?Follow these instructions at home: ?Eating and drinking ? ?If told, follow the DASH eating plan. To follow this plan: ?Fill one half of your plate at each meal with fruits and vegetables. ?Fill one fourth of your plate  at each meal with whole grains. Whole grains include whole-wheat pasta, brown rice, and whole-grain bread. ?Eat or drink low-fat dairy products, such as skim milk or low-fat yogurt. ?Fill one fourth of your plate at each meal with low-fat (lean) proteins. Low-fat proteins include fish, chicken without skin, eggs, beans, and tofu. ?Avoid fatty meat, cured and processed meat, or chicken with skin. ?Avoid pre-made or processed food. ?Limit the amount of salt in your diet to less than 1,500 mg each day. ?Do not drink alcohol if: ?Your doctor tells you not to drink. ?You are pregnant, may be pregnant, or are planning to become pregnant. ?If you drink alcohol: ?Limit how much you have to: ?0-1 drink a day for women. ?0-2 drinks a day for men. ?Know how much alcohol is in your drink. In the [place] one drink equals one 12 oz bottle of beer (355 mL), one 5 oz glass of wine (148 mL), or one 1? oz glass of hard liquor (44 mL). ?Lifestyle ? ?Work with your doctor to stay at a healthy weight or to lose weight. Ask your doctor what the best weight is for you. ?Get at least 30 minutes of exercise that causes your heart to beat faster (aerobic exercise) most days of the week. This may include walking, swimming, or biking. ?Get at least 30 minutes of exercise that strengthens your muscles (resistance exercise) at least 3 days a week. This may include lifting weights or doing Pilates. ?Do not smoke or use any products that contain nicotine or tobacco. If you need help quitting, ask your doctor. ?Check your blood pressure at home as told by your doctor. ?Keep all follow-up visits. ?Medicines ?Take over-the-counter and prescription medicines   only as told by your doctor. Follow directions carefully. ?Do not skip doses of blood pressure medicine. The medicine does not work as well if you skip doses. Skipping doses also puts you at risk for problems. ?Ask your doctor about side effects or reactions to medicines that you should watch  for. ?Contact a doctor if: ?You think you are having a reaction to the medicine you are taking. ?You have headaches that keep coming back. ?You feel dizzy. ?You have swelling in your ankles. ?You have trouble with your vision. ?Get help right away if: ?You get a very bad headache. ?You start to feel mixed up (confused). ?You feel weak or numb. ?You feel faint. ?You have very bad pain in your: ?Chest. ?Belly (abdomen). ?You vomit more than once. ?You have trouble breathing. ?These symptoms may be an emergency. Get help right away. Call 911. ?Do not wait to see if the symptoms will go away. ?Do not drive yourself to the hospital. ?Summary ?Hypertension is another name for high blood pressure. ?High blood pressure forces your heart to work harder to pump blood. ?For most people, a normal blood pressure is less than 120/80. ?Making healthy choices can help lower blood pressure. If your blood pressure does not get lower with healthy choices, you may need to take medicine. ?This information is not intended to replace advice given to you by your health care provider. Make sure you discuss any questions you have with your health care provider. ?Document Revised: 11/20/2020 Document Reviewed: 11/20/2020 ?Elsevier Patient Education ? 2023 Elsevier Inc[employer] ? ?

## 2022-05-06 LAB — MICROALBUMIN / CREATININE URINE RATIO
Creatinine, Urine: 98.5 mg/dL
Microalb/Creat Ratio: 4 mg/g creat (ref 0–29)
Microalbumin, Urine: 3.5 ug/mL

## 2022-05-08 ENCOUNTER — Encounter: Payer: Self-pay | Admitting: Internal Medicine

## 2022-05-10 LAB — PROTEIN ELECTROPHORESIS, SERUM
A/G Ratio: 1.1 (ref 0.7–1.7)
Albumin ELP: 3.8 g/dL (ref 2.9–4.4)
Alpha 1: 0.2 g/dL (ref 0.0–0.4)
Alpha 2: 0.8 g/dL (ref 0.4–1.0)
Beta: 1.2 g/dL (ref 0.7–1.3)
Gamma Globulin: 1.3 g/dL (ref 0.4–1.8)
Globulin, Total: 3.5 g/dL (ref 2.2–3.9)

## 2022-05-10 LAB — CMP14+EGFR
ALT: 14 IU/L (ref 0–32)
AST: 22 IU/L (ref 0–40)
Albumin/Globulin Ratio: 1.3 (ref 1.2–2.2)
Albumin: 4.1 g/dL (ref 3.8–4.8)
Alkaline Phosphatase: 69 IU/L (ref 44–121)
BUN/Creatinine Ratio: 19 (ref 12–28)
BUN: 24 mg/dL (ref 8–27)
Bilirubin Total: 0.5 mg/dL (ref 0.0–1.2)
CO2: 22 mmol/L (ref 20–29)
Calcium: 9.4 mg/dL (ref 8.7–10.3)
Chloride: 103 mmol/L (ref 96–106)
Creatinine, Ser: 1.28 mg/dL — ABNORMAL HIGH (ref 0.57–1.00)
Globulin, Total: 3.2 g/dL (ref 1.5–4.5)
Glucose: 81 mg/dL (ref 70–99)
Potassium: 4.3 mmol/L (ref 3.5–5.2)
Sodium: 140 mmol/L (ref 134–144)
Total Protein: 7.3 g/dL (ref 6.0–8.5)
eGFR: 44 mL/min/{1.73_m2} — ABNORMAL LOW (ref >59)

## 2022-05-10 LAB — CBC
Hematocrit: 34.9 % (ref 34.0–46.6)
Hemoglobin: 10.9 g/dL — ABNORMAL LOW (ref 11.1–15.9)
MCH: 25.7 pg — ABNORMAL LOW (ref 26.6–33.0)
MCHC: 31.2 g/dL — ABNORMAL LOW (ref 31.5–35.7)
MCV: 82 fL (ref 79–97)
Platelets: 302 10*3/uL (ref 150–450)
RBC: 4.24 x10E6/uL (ref 3.77–5.28)
RDW: 14.1 % (ref 11.7–15.4)
WBC: 8 10*3/uL (ref 3.4–10.8)

## 2022-05-10 LAB — LIPID PANEL
Chol/HDL Ratio: 2.9 ratio (ref 0.0–4.4)
Cholesterol, Total: 172 mg/dL (ref 100–199)
HDL: 59 mg/dL (ref >39)
LDL Chol Calc (NIH): 86 mg/dL (ref 0–99)
Triglycerides: 160 mg/dL — ABNORMAL HIGH (ref 0–149)
VLDL Cholesterol Cal: 27 mg/dL (ref 5–40)

## 2022-05-10 LAB — HEMOGLOBIN A1C
Est. average glucose Bld gHb Est-mCnc: 128 mg/dL
Hgb A1c MFr Bld: 6.1 % — ABNORMAL HIGH (ref 4.8–5.6)

## 2022-05-10 LAB — PTH, INTACT AND CALCIUM: PTH: 43 pg/mL (ref 15–65)

## 2022-05-10 LAB — PHOSPHORUS: Phosphorus: 3.6 mg/dL (ref 3.0–4.3)

## 2022-05-10 LAB — VITAMIN D 25 HYDROXY (VIT D DEFICIENCY, FRACTURES): Vit D, 25-Hydroxy: 87 ng/mL (ref 30.0–100.0)

## 2022-05-24 ENCOUNTER — Other Ambulatory Visit: Payer: Self-pay | Admitting: Internal Medicine

## 2022-06-28 ENCOUNTER — Other Ambulatory Visit: Payer: Self-pay | Admitting: Internal Medicine

## 2022-07-13 ENCOUNTER — Other Ambulatory Visit: Payer: Self-pay | Admitting: Internal Medicine

## 2022-07-13 DIAGNOSIS — K219 Gastro-esophageal reflux disease without esophagitis: Secondary | ICD-10-CM

## 2022-07-15 ENCOUNTER — Encounter: Payer: Self-pay | Admitting: Internal Medicine

## 2022-07-15 ENCOUNTER — Ambulatory Visit (INDEPENDENT_AMBULATORY_CARE_PROVIDER_SITE_OTHER): Payer: Medicare Other | Admitting: Internal Medicine

## 2022-07-15 VITALS — BP 122/74 | HR 80 | Temp 98.0°F | Ht 66.0 in | Wt 228.6 lb

## 2022-07-15 DIAGNOSIS — N1831 Chronic kidney disease, stage 3a: Secondary | ICD-10-CM | POA: Diagnosis not present

## 2022-07-15 DIAGNOSIS — M7918 Myalgia, other site: Secondary | ICD-10-CM

## 2022-07-15 DIAGNOSIS — E559 Vitamin D deficiency, unspecified: Secondary | ICD-10-CM

## 2022-07-15 DIAGNOSIS — M2550 Pain in unspecified joint: Secondary | ICD-10-CM

## 2022-07-15 DIAGNOSIS — I1310 Hypertensive heart and chronic kidney disease without heart failure, with stage 1 through stage 4 chronic kidney disease, or unspecified chronic kidney disease: Secondary | ICD-10-CM | POA: Diagnosis not present

## 2022-07-15 DIAGNOSIS — Z6836 Body mass index (BMI) 36.0-36.9, adult: Secondary | ICD-10-CM | POA: Diagnosis not present

## 2022-07-15 DIAGNOSIS — I700 Atherosclerosis of aorta: Secondary | ICD-10-CM

## 2022-07-15 DIAGNOSIS — R7309 Other abnormal glucose: Secondary | ICD-10-CM

## 2022-07-15 NOTE — Progress Notes (Signed)
I,[name],acting as a scribe for Gwynneth Aliment, MD.,have documented all relevant documentation on the behalf of Gwynneth Aliment, MD,as directed by  Gwynneth Aliment, MD while in the presence of Gwynneth Aliment, MD.    Subjective:     Patient ID: Vanessa Ward , female    DOB: [birth year] , 77 y.o.   MRN: [id number]   Chief Complaint  Patient presents with   Hypertension    HPI  Patient presents today for a bp check. She reports compliance with meds. She denies headaches, chest pain and palpitation & worsening SOB.  She is happy to report she has been more active.       Hypertension This is a chronic problem. The current episode started more than 1 year ago. The problem has been gradually improving since onset. The problem is controlled. Pertinent negatives include no blurred vision or palpitations. Risk factors for coronary artery disease include dyslipidemia, obesity, post-menopausal state and sedentary lifestyle. Past treatments include ACE inhibitors and diuretics. The current treatment provides moderate improvement. Compliance problems include exercise.  Hypertensive end-organ damage includes kidney disease.     Past Medical History:  Diagnosis Date   Arthritis    Chronic kidney disease    resent diagnosis of hematuria due to benigh kidney tumor   GERD (gastroesophageal reflux disease)    Hypertension    Shingles      Family History  Problem Relation Age of Onset   Hypertension Mother    Heart disease Mother    Hypertension Father    Diabetes Paternal Grandmother      Current Outpatient Medications:    aspirin EC 81 MG tablet, Take 81 mg by mouth daily., Disp: , Rfl:    Cholecalciferol (DIALYVITE VITAMIN D 5000 PO), Take by mouth., Disp: , Rfl:    lisinopril-hydrochlorothiazide (ZESTORETIC) 20-25 MG tablet, Take 1 tablet by mouth once daily, Disp: 90 tablet, Rfl: 0   omeprazole (PRILOSEC) 40 MG capsule, Take 1 capsule by mouth once daily, Disp: 90  capsule, Rfl: 0   rosuvastatin (CRESTOR) 10 MG tablet, Take 1 tablet by mouth once daily, Disp: 90 tablet, Rfl: 0   Tiotropium Bromide-Olodaterol (STIOLTO RESPIMAT) 2.5-2.5 MCG/ACT AERS, Inhale 2 puffs into the lungs daily., Disp: 4 g, Rfl: 11   loratadine (CLARITIN) 10 MG tablet, Take 1 tablet (10 mg total) by mouth daily as needed for allergies., Disp: 30 tablet, Rfl: 2   Allergies  Allergen Reactions   Other Rash    Perfume, scented lotion     Review of Systems  Constitutional: Negative.   Eyes:  Negative for blurred vision.  Respiratory: Negative.    Cardiovascular: Negative.  Negative for palpitations.  Gastrointestinal: Negative.   Musculoskeletal:  Positive for back pain.       She c/o pain above buttocks. Denies recent fall. Not sure what triggered her sx. Denies urinary/fecal incontinence. Denies LE paresthesias.   Skin: Negative.   Neurological: Negative.   Psychiatric/Behavioral: Negative.       Today's Vitals   07/15/22 1601  BP: 122/74  Pulse: 80  Temp: 98 F (36.7 C)  SpO2: 98%  Weight: 228 lb 9.6 oz (103.7 kg)  Height: 5\' 6"  (1.676 m)   Wt Readings from Last 3 Encounters:  07/15/22 228 lb 9.6 oz (103.7 kg)  05/05/22 230 lb (104.3 kg)  05/05/22 230 lb 12.8 oz (104.7 kg)    Body mass index is 36.9 kg/m.  The 10-year ASCVD risk score (Arnett  DK, et al., 2019) is: 11.7%   Values used to calculate the score:     Age: 41 years     Sex: Female     Is Non-Hispanic African American: Yes     Diabetic: No     Tobacco smoker: No     Systolic Blood Pressure: 114 mmHg     Is BP treated: Yes     HDL Cholesterol: 59 mg/dL     Total Cholesterol: 172 mg/dL ++ Objective:  Physical Exam Vitals and nursing note reviewed.  Constitutional:      Appearance: Normal appearance. She is obese.  HENT:     Head: Normocephalic and atraumatic.  Eyes:     Extraocular Movements: Extraocular movements intact.  Cardiovascular:     Rate and Rhythm: Normal rate and regular  rhythm.     Heart sounds: Normal heart sounds.  Pulmonary:     Effort: Pulmonary effort is normal.     Breath sounds: Normal breath sounds.  Musculoskeletal:        General: Tenderness present.     Cervical back: Normal range of motion.  Skin:    General: Skin is warm.  Neurological:     General: No focal deficit present.     Mental Status: She is alert.  Psychiatric:        Mood and Affect: Mood normal.        Behavior: Behavior normal.         Assessment And Plan:     1. Hypertensive heart and renal disease with renal failure, stage 1 through stage 4 or unspecified chronic kidney disease, without heart failure Comments: Chronic, well controlled. She will c/w lisinopril/hctz 20/25mg  daily. Reminded to follow low wodium diet.  She will f/u in 4-6 months. - Amb Referral To Provider Referral Exercise Program (P.R.E.P)  2. Stage 3a chronic kidney disease (HCC) Comments: Chronic, reminded to avoid NSAIDs, stay well hydrated and to keep BP well controlled to decrease risk of CKD progression. - Amb Referral To Provider Referral Exercise Program (P.R.E.P) - BMP8+EGFR; Future  3. Atherosclerosis of aorta (HCC) Comments: Chronic, LDL goal < 70. She will c/w rosuvastatin 10mg  daily and ASA 81mg  daily. She is encouraged to follow a heart healthy lifestyle. - Amb Referral To Provider Referral Exercise Program (P.R.E.P)  4. Bilateral buttock pain Comments: She may benefit from stretching exercises, possibly pelvic floor exercises.  She will let me know if her symptoms persist.  5. Arthralgia, unspecified joint Comments: Chronic, I will check an arthritis panel. Would benefit from an anti-inflammatory diet - diet from of processed, sugary foods. - ANA, IFA (with reflex); Future - CYCLIC CITRUL PEPTIDE ANTIBODY, IGG/IGA; Future - Rheumatoid factor; Future - Sedimentation rate; Future - Uric acid; Future  6. Other abnormal glucose Comments: Previous labs reviewed, does not wish to have  labs performed today. Agrees to rto in 1-2 weeks for lab visit. - Hemoglobin A1c; Future - Amb Referral To Provider Referral Exercise Program (P.R.E.P)  7. Class 2 severe obesity due to excess calories with serious comorbidity and body mass index (BMI) of 36.0 to 36.9 in adult [facility]) Comments: She is encouraged to aim for at least 150 minutes of exercise/week, while striving for BMI<30 to decrease cardiac risk. - Amb Referral To Provider Referral Exercise Program (P.R.E.P)    Return in 4 months (on 11/15/2022), or bp check, for 2 weeks lab visit-orders are in.  Patient was given opportunity to ask questions. Patient verbalized understanding of the plan  and was able to repeat key elements of the plan. All questions were answered to their satisfaction.   I, Gwynneth Aliment, MD, have reviewed all documentation for this visit. The documentation on 07/15/22 for the exam, diagnosis, procedures, and orders are all accurate and complete.   IF YOU HAVE BEEN REFERRED TO A SPECIALIST, IT MAY TAKE 1-2 WEEKS TO SCHEDULE/PROCESS THE REFERRAL. IF YOU HAVE NOT HEARD FROM US/SPECIALIST IN TWO WEEKS, PLEASE GIVE [place] A CALL AT [phone] X [phone].   THE PATIENT IS ENCOURAGED TO PRACTICE SOCIAL DISTANCING DUE TO THE COVID-19 PANDEMIC.

## 2022-07-15 NOTE — Patient Instructions (Signed)
Hypertension, Adult Hypertension is another name for high blood pressure. High blood pressure forces your heart to work harder to pump blood. This can cause problems over time. There are two numbers in a blood pressure reading. There is a top number (systolic) over a bottom number (diastolic). It is best to have a blood pressure that is below 120/80. What are the causes? The cause of this condition is not known. Some other conditions can lead to high blood pressure. What increases the risk? Some lifestyle factors can make you more likely to develop high blood pressure: Smoking. Not getting enough exercise or physical activity. Being overweight. Having too much fat, sugar, calories, or salt (sodium) in your diet. Drinking too much alcohol. Other risk factors include: Having any of these conditions: Heart disease. Diabetes. High cholesterol. Kidney disease. Obstructive sleep apnea. Having a family history of high blood pressure and high cholesterol. Age. The risk increases with age. Stress. What are the signs or symptoms? High blood pressure may not cause symptoms. Very high blood pressure (hypertensive crisis) may cause: Headache. Fast or uneven heartbeats (palpitations). Shortness of breath. Nosebleed. Vomiting or feeling like you may vomit (nauseous). Changes in how you see. Very bad chest pain. Feeling dizzy. Seizures. How is this treated? This condition is treated by making healthy lifestyle changes, such as: Eating healthy foods. Exercising more. Drinking less alcohol. Your doctor may prescribe medicine if lifestyle changes do not help enough and if: Your top number is above 130. Your bottom number is above 80. Your personal target blood pressure may vary. Follow these instructions at home: Eating and drinking  If told, follow the DASH eating plan. To follow this plan: Fill one half of your plate at each meal with fruits and vegetables. Fill one fourth of your plate  at each meal with whole grains. Whole grains include whole-wheat pasta, brown rice, and whole-grain bread. Eat or drink low-fat dairy products, such as skim milk or low-fat yogurt. Fill one fourth of your plate at each meal with low-fat (lean) proteins. Low-fat proteins include fish, chicken without skin, eggs, beans, and tofu. Avoid fatty meat, cured and processed meat, or chicken with skin. Avoid pre-made or processed food. Limit the amount of salt in your diet to less than 1,500 mg each day. Do not drink alcohol if: Your doctor tells you not to drink. You are pregnant, may be pregnant, or are planning to become pregnant. If you drink alcohol: Limit how much you have to: 0-1 drink a day for women. 0-2 drinks a day for men. Know how much alcohol is in your drink. In the [place] one drink equals one 12 oz bottle of beer (355 mL), one 5 oz glass of wine (148 mL), or one 1 oz glass of hard liquor (44 mL). Lifestyle  Work with your doctor to stay at a healthy weight or to lose weight. Ask your doctor what the best weight is for you. Get at least 30 minutes of exercise that causes your heart to beat faster (aerobic exercise) most days of the week. This may include walking, swimming, or biking. Get at least 30 minutes of exercise that strengthens your muscles (resistance exercise) at least 3 days a week. This may include lifting weights or doing Pilates. Do not smoke or use any products that contain nicotine or tobacco. If you need help quitting, ask your doctor. Check your blood pressure at home as told by your doctor. Keep all follow-up visits. Medicines Take over-the-counter and prescription medicines   only as told by your doctor. Follow directions carefully. Do not skip doses of blood pressure medicine. The medicine does not work as well if you skip doses. Skipping doses also puts you at risk for problems. Ask your doctor about side effects or reactions to medicines that you should watch  for. Contact a doctor if: You think you are having a reaction to the medicine you are taking. You have headaches that keep coming back. You feel dizzy. You have swelling in your ankles. You have trouble with your vision. Get help right away if: You get a very bad headache. You start to feel mixed up (confused). You feel weak or numb. You feel faint. You have very bad pain in your: Chest. Belly (abdomen). You vomit more than once. You have trouble breathing. These symptoms may be an emergency. Get help right away. Call 911. Do not wait to see if the symptoms will go away. Do not drive yourself to the hospital. Summary Hypertension is another name for high blood pressure. High blood pressure forces your heart to work harder to pump blood. For most people, a normal blood pressure is less than 120/80. Making healthy choices can help lower blood pressure. If your blood pressure does not get lower with healthy choices, you may need to take medicine. This information is not intended to replace advice given to you by your health care provider. Make sure you discuss any questions you have with your health care provider. Document Revised: 11/20/2020 Document Reviewed: 11/20/2020 Elsevier Patient Education  2024 Elsevier Inc.  

## 2022-07-20 ENCOUNTER — Ambulatory Visit
Admission: EM | Admit: 2022-07-20 | Discharge: 2022-07-20 | Disposition: A | Discharge status: Home / Self Care | Payer: Medicare Other | Attending: Family Medicine | Admitting: Family Medicine

## 2022-07-20 DIAGNOSIS — K115 Sialolithiasis: Secondary | ICD-10-CM

## 2022-07-20 NOTE — Discharge Instructions (Signed)
You can take Tylenol as needed if your salivary gland hurts.  Please review the educational materials enclosed in this discharge summary.  Please call your primary care to follow-up with them about this issue

## 2022-07-20 NOTE — ED Provider Notes (Signed)
[facility]    CSN: [id number] Arrival date & time: 07/20/22  1542      History   Chief Complaint Chief Complaint  Patient presents with   Insect Bite    HPI Vanessa Ward is a 77 y.o. female.   HPI Here for swelling around her posterior jaw and near her right ear.  She first noticed it this afternoon when she was eating something.  It is not hurt and actually is little less puffy now than it was when she was eating.  No fever or chills.  No tooth pain and no ear pain    Past Medical History:  Diagnosis Date   Arthritis    Chronic kidney disease    resent diagnosis of hematuria due to benigh kidney tumor   GERD (gastroesophageal reflux disease)    Hypertension    Shingles     Patient Active Problem List   Diagnosis Date Noted   Centrilobular emphysema (HCC) 12/15/2021   Dyspnea on exertion 07/23/2020   Atherosclerosis of aorta (HCC) 07/09/2019   Cigarette nicotine dependence in remission 07/09/2019   Other abnormal glucose 07/09/2019   Chronic renal disease, stage II 07/09/2019   Hypertensive nephropathy 07/09/2019   Class 3 severe obesity due to excess calories with serious comorbidity and body mass index (BMI) of 40.0 to 44.9 in adult (HCC) 01/18/2018   Essential hypertension, benign 01/04/2018   Hyperlipidemia 12/03/2017   Cervical dysplasia, moderate 12/17/2011   Cervical polyp 10/15/2011   Papanicolaou smear of cervix with low grade squamous intraepithelial lesion (LGSIL) 10/15/2011    Past Surgical History:  Procedure Laterality Date   BIOPSY  10/15/2011   Procedure: BIOPSY;  Surgeon: Antionette Char, MD;  Location: [facility];  Service: Gynecology;;  Cervical   CERVICAL CONIZATION W/BX  12/17/2011   Procedure: CONIZATION CERVIX WITH BIOPSY;  Surgeon: Antionette Char, MD;  Location: [facility];  Service: Gynecology;  Laterality: N/A;   COLPOSCOPY  10/15/2011   Procedure: COLPOSCOPY;  Surgeon: Antionette Char, MD;  Location: [facility];   Service: Gynecology;  Laterality: N/A;   DIAGNOSTIC LAPAROSCOPY     TUBAL LIGATION      OB History   No obstetric history on file.      Home Medications    Prior to Admission medications   Medication Sig Start Date End Date Taking? Authorizing Provider  aspirin EC 81 MG tablet Take 81 mg by mouth daily.   Yes [provider]  Cholecalciferol (DIALYVITE VITAMIN D 5000 PO) Take by mouth.   Yes [provider]  lisinopril-hydrochlorothiazide (ZESTORETIC) 20-25 MG tablet Take 1 tablet by mouth once daily 06/28/22  Yes Dorothyann Peng, MD  omeprazole (PRILOSEC) 40 MG capsule Take 1 capsule by mouth once daily 07/13/22  Yes Dorothyann Peng, MD  rosuvastatin (CRESTOR) 10 MG tablet Take 1 tablet by mouth once daily 05/27/22  Yes Dorothyann Peng, MD  Tiotropium Bromide-Olodaterol (STIOLTO RESPIMAT) 2.5-2.5 MCG/ACT AERS Inhale 2 puffs into the lungs daily. 03/03/22  Yes Martina Sinner, MD  loratadine (CLARITIN) 10 MG tablet Take 1 tablet (10 mg total) by mouth daily as needed for allergies. 10/09/20 03/03/22  Dorothyann Peng, MD    Family History Family History  Problem Relation Age of Onset   Hypertension Mother    Heart disease Mother    Hypertension Father    Diabetes Paternal Grandmother     Social History Social History   Tobacco Use   Smoking status: Former    Packs/day: 0.50  Years: 46.00    Additional pack years: 0.00    Total pack years: 23.00    Types: Cigarettes, Pipe    Start date: 16    Quit date: 2016    Years since quitting: 8.4   Smokeless tobacco: Never   Tobacco comments:    Quit 8 years ago,   Vaping Use   Vaping Use: Never used  Substance Use Topics   Alcohol use: Yes    Alcohol/week: 2.0 standard drinks of alcohol    Types: 2 Standard drinks or equivalent per week   Drug use: No     Allergies   Other   Review of Systems Review of Systems   Physical Exam Triage Vital Signs ED Triage Vitals  Enc Vitals Group     BP  07/20/22 1612 (!) 114/51     Pulse Rate 07/20/22 1610 93     Resp 07/20/22 1610 16     Temp 07/20/22 1610 98.3 F (36.8 C)     Temp Source 07/20/22 1610 Oral     SpO2 07/20/22 1610 93 %     Weight --      Height --      Head Circumference --      Peak Flow --      Pain Score 07/20/22 1543 0     Pain Loc --      Pain Edu? --      Excl. in GC? --    No data found.  Updated Vital Signs BP (!) 114/51 (BP Location: Left Arm)   Pulse 93   Temp 98.3 F (36.8 C) (Oral)   Resp 16   SpO2 93%   Visual Acuity Right Eye Distance:   Left Eye Distance:   Bilateral Distance:    Right Eye Near:   Left Eye Near:    Bilateral Near:     Physical Exam Vitals reviewed.  Constitutional:      General: She is not in acute distress.    Appearance: She is not ill-appearing, toxic-appearing or diaphoretic.  HENT:     Head:     Comments: The parotid gland on the right is mildly swollen from her cheek onto her angle of the right jaw.  It is nontender and is fairly soft.  There is no fluctuance or warmth    Mouth/Throat:     Mouth: Mucous membranes are moist.     Pharynx: No oropharyngeal exudate or posterior oropharyngeal erythema.  Eyes:     Extraocular Movements: Extraocular movements intact.     Conjunctiva/sclera: Conjunctivae normal.     Pupils: Pupils are equal, round, and reactive to light.  Cardiovascular:     Rate and Rhythm: Normal rate and regular rhythm.  Pulmonary:     Effort: Pulmonary effort is normal.     Breath sounds: Normal breath sounds.  Neurological:     General: No focal deficit present.     Mental Status: She is alert and oriented to person, place, and time.  Psychiatric:        Behavior: Behavior normal.      UC Treatments / Results  Labs (all labs ordered are listed, but only abnormal results are displayed) Labs Reviewed - No data to display  EKG   Radiology No results found.  Procedures Procedures (including critical care time)  Medications  Ordered in UC Medications - No data to display  Initial Impression / Assessment and Plan / UC Course  I have reviewed the triage vital  signs and the nursing notes.  Pertinent labs & imaging results that were available during my care of the patient were reviewed by me and considered in my medical decision making (see chart for details).        Discussed with her that this seems to be a salivary gland stone.  Education is printed for her and she will follow-up with primary care.  She will take Tylenol as needed if it hurts. Final Clinical Impressions(s) / UC Diagnoses   Final diagnoses:  Salivary gland stone     Discharge Instructions      You can take Tylenol as needed if your salivary gland hurts.  Please review the educational materials enclosed in this discharge summary.  Please call your primary care to follow-up with them about this issue     ED Prescriptions   None    PDMP not reviewed this encounter.   Zenia Resides, MD 07/20/22 [phone]

## 2022-07-20 NOTE — ED Triage Notes (Signed)
Pt is here for insect bite/ swollen neck on her right side.

## 2022-07-26 DIAGNOSIS — M7918 Myalgia, other site: Secondary | ICD-10-CM | POA: Insufficient documentation

## 2022-07-26 DIAGNOSIS — M2550 Pain in unspecified joint: Secondary | ICD-10-CM | POA: Insufficient documentation

## 2022-07-26 DIAGNOSIS — N1831 Chronic kidney disease, stage 3a: Secondary | ICD-10-CM | POA: Insufficient documentation

## 2022-07-29 ENCOUNTER — Other Ambulatory Visit: Payer: Medicare Other

## 2022-07-29 DIAGNOSIS — M2550 Pain in unspecified joint: Secondary | ICD-10-CM | POA: Diagnosis not present

## 2022-07-29 DIAGNOSIS — R7309 Other abnormal glucose: Secondary | ICD-10-CM

## 2022-07-29 DIAGNOSIS — N1831 Chronic kidney disease, stage 3a: Secondary | ICD-10-CM | POA: Diagnosis not present

## 2022-08-03 LAB — BMP8+EGFR
BUN/Creatinine Ratio: 20 (ref 12–28)
BUN: 26 mg/dL (ref 8–27)
CO2: 22 mmol/L (ref 20–29)
Calcium: 9 mg/dL (ref 8.7–10.3)
Chloride: 103 mmol/L (ref 96–106)
Creatinine, Ser: 1.3 mg/dL — ABNORMAL HIGH (ref 0.57–1.00)
Glucose: 104 mg/dL — ABNORMAL HIGH (ref 70–99)
Potassium: 3.9 mmol/L (ref 3.5–5.2)
Sodium: 141 mmol/L (ref 134–144)
eGFR: 43 mL/min/{1.73_m2} — ABNORMAL LOW (ref >59)

## 2022-08-03 LAB — URIC ACID: Uric Acid: 8 mg/dL — ABNORMAL HIGH (ref 3.1–7.9)

## 2022-08-03 LAB — HEMOGLOBIN A1C
Est. average glucose Bld gHb Est-mCnc: 131 mg/dL
Hgb A1c MFr Bld: 6.2 % — ABNORMAL HIGH (ref 4.8–5.6)

## 2022-08-03 LAB — ANTINUCLEAR ANTIBODIES, IFA: ANA Titer 1: NEGATIVE

## 2022-08-03 LAB — CYCLIC CITRUL PEPTIDE ANTIBODY, IGG/IGA: Cyclic Citrullin Peptide Ab: 6 units (ref 0–19)

## 2022-08-03 LAB — SEDIMENTATION RATE: Sed Rate: 16 mm/hr (ref 0–40)

## 2022-08-03 LAB — RHEUMATOID FACTOR: Rheumatoid fact SerPl-aCnc: 11.9 IU/mL (ref <14.0)

## 2022-08-13 DIAGNOSIS — M79604 Pain in right leg: Secondary | ICD-10-CM | POA: Diagnosis not present

## 2022-08-13 DIAGNOSIS — M545 Low back pain, unspecified: Secondary | ICD-10-CM | POA: Diagnosis not present

## 2022-08-13 DIAGNOSIS — M79605 Pain in left leg: Secondary | ICD-10-CM | POA: Diagnosis not present

## 2022-08-26 ENCOUNTER — Other Ambulatory Visit: Payer: Self-pay | Admitting: Internal Medicine

## 2022-08-30 ENCOUNTER — Ambulatory Visit
Admission: RE | Admit: 2022-08-30 | Discharge: 2022-08-30 | Disposition: A | Discharge status: Home / Self Care | Payer: Medicare Other | Source: Ambulatory Visit | Attending: Internal Medicine | Admitting: Internal Medicine

## 2022-08-30 DIAGNOSIS — F17211 Nicotine dependence, cigarettes, in remission: Secondary | ICD-10-CM

## 2022-08-30 DIAGNOSIS — Z87891 Personal history of nicotine dependence: Secondary | ICD-10-CM | POA: Diagnosis not present

## 2022-09-20 ENCOUNTER — Other Ambulatory Visit: Payer: Self-pay | Admitting: Internal Medicine

## 2022-09-20 DIAGNOSIS — Z1231 Encounter for screening mammogram for malignant neoplasm of breast: Secondary | ICD-10-CM

## 2022-09-29 ENCOUNTER — Encounter: Payer: Self-pay | Admitting: Pulmonary Disease

## 2022-09-29 ENCOUNTER — Ambulatory Visit: Payer: Medicare Other | Admitting: Pulmonary Disease

## 2022-09-29 VITALS — BP 112/80 | HR 76 | Temp 97.6°F | Ht 64.0 in | Wt 229.0 lb

## 2022-09-29 DIAGNOSIS — J432 Centrilobular emphysema: Secondary | ICD-10-CM | POA: Diagnosis not present

## 2022-09-29 MED ORDER — ALBUTEROL SULFATE HFA 108 (90 BASE) MCG/ACT IN AERS: 2.0000 | INHALATION_SPRAY | Freq: Four times a day (QID) | RESPIRATORY_TRACT | 6 refills | Status: DC | PRN

## 2022-09-29 NOTE — Progress Notes (Signed)
Synopsis: Referred in November 2023 for shortness of breath by Vanessa Peng, MD  Subjective:   PATIENT ID: Vanessa Ward GENDER: female DOB: [birth year], MRN: [id number]  HPI  Chief Complaint  Patient presents with   Follow-up    SOB    Vanessa Ward is a 77 year old woman, former smoker with GERD, hypertension and CKD who returns to pulmonary clinic for centrilobular emphysema.   She is taking stiolto intermittently with some effect. She continues to have exertional dyspnea on some days where she needs to take frequent breaks. Overall she remains very independent in her daily activities.    OV 03/03/22 She felt some improvement in her shortness of breath with trial of trelegy ellipta 1 puff but it irritated her throat.   PFTs today show moderate obstruction, no significant bronchodilator effect, air trapping and moderate diffusion defect.  Intial OV 12/30/21 She reports developing shortness of breath over recent months. The dyspnea is with exertion. She denies wheezing or cough. She denies night time awakenings due to shortness of breath. She does report snoring. She does feel rested a majority of the time after a full night of sleep. Denies daytime sleepiness. She does not take naps.   She has been evaluated by cardiology with normal echo. Stress test did not show ischemic changes but she has poor exercise capacity and blood pressure was 198/85 during exercise.  She quit smoking 10 years ago. She smoked for 30 years, less than a pack per day. She is enrolled in lung cancer screening through her PCP. 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 She lives alone and has children that live in the area.   Past Medical History:  Diagnosis Date   Arthritis    Chronic kidney disease    resent diagnosis of hematuria due to benigh kidney tumor   GERD (gastroesophageal reflux disease)     Hypertension    Shingles      Family History  Problem Relation Age of Onset   Hypertension Mother    Heart disease Mother    Hypertension Father    Diabetes Paternal Grandmother      Social History   Socioeconomic History   Marital status: Single    Spouse name: Not on file   Number of children: Not on file   Years of education: Not on file   Highest education level: Not on file  Occupational History   Not on file  Tobacco Use   Smoking status: Former    Current packs/day: 0.00    Average packs/day: 0.5 packs/day for 49.0 years (24.5 ttl pk-yrs)    Types: Cigarettes, Pipe    Start date: 70    Quit date: 2016    Years since quitting: 8.6   Smokeless tobacco: Never   Tobacco comments:    Quit 8 years ago,   Vaping Use   Vaping status: Never Used  Substance and Sexual Activity   Alcohol use: Yes    Alcohol/week: 2.0 standard drinks of alcohol    Types: 2 Standard drinks or equivalent per week   Drug use: No   Sexual activity: Not Currently    Birth control/protection: Post-menopausal  Other Topics Concern   Not on file  Social History Narrative   Not on file   Social Determinants of Health   Financial Resource Strain: Low Risk  (05/05/2022)   Overall Financial Resource Strain (CARDIA)  Difficulty of Paying Living Expenses: Not hard at all  Food Insecurity: No Food Insecurity (05/05/2022)   Hunger Vital Sign    Worried About Running Out of Food in the Last Year: Never true    Ran Out of Food in the Last Year: Never true  Transportation Needs: No Transportation Needs (05/05/2022)   PRAPARE - Administrator, Civil Service (Medical): No    Lack of Transportation (Non-Medical): No  Physical Activity: Inactive (05/05/2022)   Exercise Vital Sign    Days of Exercise per Week: 0 days    Minutes of Exercise per Session: 0 min  Stress: No Stress Concern Present (05/05/2022)   [name]     Feeling of Stress : Not at all  Social Connections: Not on file  Intimate Partner Violence: Not At Risk (12/21/2017)   Humiliation, Afraid, Rape, and Kick questionnaire    Fear of Current or Ex-Partner: No    Emotionally Abused: No    Physically Abused: No    Sexually Abused: No     Allergies  Allergen Reactions   Other Rash    Perfume, scented lotion     Outpatient Medications Prior to Visit  Medication Sig Dispense Refill   aspirin EC 81 MG tablet Take 81 mg by mouth daily.     Cholecalciferol (DIALYVITE VITAMIN D 5000 PO) Take by mouth.     EQ LORATADINE 10 MG tablet TAKE 1 TABLET BY MOUTH ONCE DAILY AS NEEDED FOR ALLERGIES 30 tablet 0   lisinopril-hydrochlorothiazide (ZESTORETIC) 20-25 MG tablet Take 1 tablet by mouth once daily 90 tablet 0   omeprazole (PRILOSEC) 40 MG capsule Take 1 capsule by mouth once daily 90 capsule 0   rosuvastatin (CRESTOR) 10 MG tablet Take 1 tablet by mouth once daily 90 tablet 0   Tiotropium Bromide-Olodaterol (STIOLTO RESPIMAT) 2.5-2.5 MCG/ACT AERS Inhale 2 puffs into the lungs daily. 4 g 11   No facility-administered medications prior to visit.    Review of Systems  Constitutional:  Negative for chills, fever, malaise/fatigue and weight loss.  HENT:  Negative for congestion, sinus pain and sore throat.   Eyes: Negative.   Respiratory:  Positive for shortness of breath. Negative for cough, hemoptysis, sputum production and wheezing.   Cardiovascular:  Negative for chest pain, palpitations, orthopnea, claudication and leg swelling.  Gastrointestinal:  Negative for abdominal pain, heartburn, nausea and vomiting.  Genitourinary: Negative.   Musculoskeletal:  Negative for joint pain and myalgias.  Skin:  Negative for rash.  Neurological:  Negative for weakness.  Endo/Heme/Allergies: Negative.   Psychiatric/Behavioral: Negative.     Objective:   Vitals:   09/29/22 1438  BP: 112/80  Pulse: 76  Temp: 97.6 F (36.4 C)  TempSrc: Temporal   SpO2: 95%  Weight: 229 lb (103.9 kg)  Height: 5\' 4"  (1.626 m)   Physical Exam Constitutional:      General: She is not in acute distress.    Appearance: She is not ill-appearing.  HENT:     Head: Normocephalic and atraumatic.  Eyes:     General: No scleral icterus.    Conjunctiva/sclera: Conjunctivae normal.  Cardiovascular:     Rate and Rhythm: Normal rate and regular rhythm.     Pulses: Normal pulses.     Heart sounds: Normal heart sounds. No murmur heard. Pulmonary:     Effort: Pulmonary effort is normal.     Breath sounds: Normal breath sounds. No wheezing, rhonchi or  rales.  Musculoskeletal:     Right lower leg: No edema.     Left lower leg: No edema.  Skin:    General: Skin is warm and dry.  Neurological:     Mental Status: She is alert.    CBC    Component Value Date/Time   WBC 8.0 05/05/2022 1610   WBC 7.1 12/13/2011 1424   RBC 4.24 05/05/2022 1610   RBC 4.16 04/09/2021 0000   HGB 10.9 (L) 05/05/2022 1610   HCT 34.9 05/05/2022 1610   PLT 302 05/05/2022 1610   MCV 82 05/05/2022 1610   MCH 25.7 (L) 05/05/2022 1610   MCH 26.2 12/13/2011 1424   MCHC 31.2 (L) 05/05/2022 1610   MCHC 31.7 12/13/2011 1424   RDW 14.1 05/05/2022 1610      Latest Ref Rng & Units 07/29/2022   10:07 AM 05/05/2022    4:10 PM 12/15/2021    5:11 PM  BMP  Glucose 70 - 99 mg/dL 191  81  86   BUN 8 - 27 mg/dL 26  24  24    Creatinine 0.57 - 1.00 mg/dL 4.78  2.95  6.21   BUN/Creat Ratio 12 - 28 20  19  18    Sodium 134 - 144 mmol/L 141  140  139   Potassium 3.5 - 5.2 mmol/L 3.9  4.3  4.2   Chloride 96 - 106 mmol/L 103  103  103   CO2 20 - 29 mmol/L 22  22  24    Calcium 8.7 - 10.3 mg/dL 9.0  9.4  9.1    Chest imaging: LCS CT Chest 08/30/22 1. Lung-RADS 1, negative. Continue annual screening with low-dose chest CT without contrast in 12 months. 2. Cholelithiasis. 3. Aortic Atherosclerosis (ICD10-I70.0) and Emphysema (ICD10-J43.9).  [facility] CT Chest 08/26/21 1. Lung-RADS 1, negative.  Continue annual screening with low-dose chest CT without contrast in 12 months. 2. Aortic atherosclerosis. 3. Mild diffuse bronchial wall thickening with very mild centrilobular and paraseptal emphysema; imaging findings suggestive of underlying COPD. 4. Cholelithiasis.  PFT:    Latest Ref Rng & Units 03/03/2022    9:54 AM  PFT Results  FVC-Pre L 1.74   FVC-Predicted Pre % 63   FVC-Post L 1.87   FVC-Predicted Post % 67   Pre FEV1/FVC % % 67   Post FEV1/FCV % % 68   FEV1-Pre L 1.17   FEV1-Predicted Pre % 56   FEV1-Post L 1.28   DLCO uncorrected ml/min/mmHg 10.18   DLCO UNC% % 54   DLCO corrected ml/min/mmHg 10.18   DLCO COR %Predicted % 54   DLVA Predicted % 77   TLC L 5.48   TLC % Predicted % 110   RV % Predicted % 170   PFT 2024: moderate obstruction, no significant bronchodilator effect, air trapping and moderate diffusion defect.  Labs:  Path:  Echo 08/21/20:  1. Left ventricular ejection fraction, by estimation, is 60 to 65%. The  left ventricle has normal function. The left ventricle has no regional  wall motion abnormalities. Left ventricular diastolic parameters were  normal. The average left ventricular  global longitudinal strain is -22.9 %. The global longitudinal strain is  normal.   2. Right ventricular systolic function is normal. The right ventricular  size is normal. Tricuspid regurgitation signal is inadequate for assessing  PA pressure.   3. The mitral valve is grossly normal. No evidence of mitral valve  regurgitation. No evidence of mitral stenosis.   4. The aortic valve  is tricuspid. Aortic valve regurgitation is not  visualized. No aortic stenosis is present.   5. The inferior vena cava is normal in size with greater than 50%  respiratory variability, suggesting right atrial pressure of 3 mmHg.  Heart Catheterization:  Assessment & Plan:   No diagnosis found.  Discussion: Amiliyah Delmonaco is a 77 year old woman, former smoker with GERD,  hypertension and CKD who returns to pulmonary clinic for COPD.   She has centrilobular emphysema based on CT Chest scan and moderate obstruction, no significant bronchodilator effect, air trapping and moderate diffusion defect based on PFTs.   She is to continue stiolto 2 puffs daily. Use albuterol inhaler as needed.  Follow up in 1 year.  Melody Comas, MD [facility] Office: [phone]   Current Outpatient Medications:    aspirin EC 81 MG tablet, Take 81 mg by mouth daily., Disp: , Rfl:    Cholecalciferol (DIALYVITE VITAMIN D 5000 PO), Take by mouth., Disp: , Rfl:    EQ LORATADINE 10 MG tablet, TAKE 1 TABLET BY MOUTH ONCE DAILY AS NEEDED FOR ALLERGIES, Disp: 30 tablet, Rfl: 0   lisinopril-hydrochlorothiazide (ZESTORETIC) 20-25 MG tablet, Take 1 tablet by mouth once daily, Disp: 90 tablet, Rfl: 0   omeprazole (PRILOSEC) 40 MG capsule, Take 1 capsule by mouth once daily, Disp: 90 capsule, Rfl: 0   rosuvastatin (CRESTOR) 10 MG tablet, Take 1 tablet by mouth once daily, Disp: 90 tablet, Rfl: 0   Tiotropium Bromide-Olodaterol (STIOLTO RESPIMAT) 2.5-2.5 MCG/ACT AERS, Inhale 2 puffs into the lungs daily., Disp: 4 g, Rfl: 11

## 2022-09-29 NOTE — Patient Instructions (Addendum)
Continue stiolto inhaler 2 puffs daily  Use albuterol inhaler 1-2 puffs every 4-6 hours as needed  Follow up in 1 year, call sooner if needed

## 2022-09-30 ENCOUNTER — Other Ambulatory Visit: Payer: Self-pay | Admitting: Internal Medicine

## 2022-10-03 ENCOUNTER — Encounter: Payer: Self-pay | Admitting: Pulmonary Disease

## 2022-10-13 ENCOUNTER — Other Ambulatory Visit: Payer: Self-pay | Admitting: Internal Medicine

## 2022-10-13 DIAGNOSIS — K219 Gastro-esophageal reflux disease without esophagitis: Secondary | ICD-10-CM

## 2022-11-01 ENCOUNTER — Ambulatory Visit: Payer: Medicare Other

## 2022-11-10 ENCOUNTER — Ambulatory Visit
Admission: RE | Admit: 2022-11-10 | Discharge: 2022-11-10 | Disposition: A | Discharge status: Home / Self Care | Payer: Medicare Other | Source: Ambulatory Visit | Attending: Internal Medicine | Admitting: Internal Medicine

## 2022-11-10 DIAGNOSIS — Z1231 Encounter for screening mammogram for malignant neoplasm of breast: Secondary | ICD-10-CM

## 2022-11-24 ENCOUNTER — Other Ambulatory Visit: Payer: Self-pay | Admitting: Internal Medicine

## 2022-11-30 DIAGNOSIS — Z23 Encounter for immunization: Secondary | ICD-10-CM | POA: Diagnosis not present

## 2022-12-30 ENCOUNTER — Other Ambulatory Visit: Payer: Self-pay | Admitting: Internal Medicine

## 2023-01-12 ENCOUNTER — Other Ambulatory Visit: Payer: Self-pay | Admitting: Internal Medicine

## 2023-01-12 DIAGNOSIS — K219 Gastro-esophageal reflux disease without esophagitis: Secondary | ICD-10-CM

## 2023-01-18 DIAGNOSIS — D3002 Benign neoplasm of left kidney: Secondary | ICD-10-CM | POA: Diagnosis not present

## 2023-01-27 ENCOUNTER — Telehealth: Payer: Self-pay | Admitting: *Deleted

## 2023-01-27 NOTE — Telephone Encounter (Signed)
Contacted regarding PREP Class referral. Informed we will not have a class at [employer]. Judie Grieve or [employer] available. She is not interested in participating at this time.

## 2023-02-23 ENCOUNTER — Other Ambulatory Visit: Payer: Self-pay | Admitting: Internal Medicine

## 2023-03-04 ENCOUNTER — Other Ambulatory Visit: Payer: Self-pay

## 2023-03-04 MED ORDER — ROSUVASTATIN CALCIUM 10 MG PO TABS: 10.0000 mg | ORAL_TABLET | Freq: Every day | ORAL | 2 refills | Status: DC

## 2023-03-29 ENCOUNTER — Other Ambulatory Visit: Payer: Self-pay | Admitting: Internal Medicine

## 2023-04-21 ENCOUNTER — Other Ambulatory Visit: Payer: Self-pay | Admitting: Internal Medicine

## 2023-04-21 DIAGNOSIS — K219 Gastro-esophageal reflux disease without esophagitis: Secondary | ICD-10-CM

## 2023-05-11 ENCOUNTER — Ambulatory Visit: Payer: Self-pay | Admitting: Internal Medicine

## 2023-05-11 ENCOUNTER — Ambulatory Visit (INDEPENDENT_AMBULATORY_CARE_PROVIDER_SITE_OTHER): Payer: Self-pay

## 2023-05-11 ENCOUNTER — Encounter: Payer: Self-pay | Admitting: Internal Medicine

## 2023-05-11 VITALS — BP 122/70 | HR 76 | Temp 98.9°F | Ht 64.0 in | Wt 233.6 lb

## 2023-05-11 VITALS — BP 122/70 | HR 76 | Temp 98.9°F | Ht 64.0 in | Wt 233.0 lb

## 2023-05-11 DIAGNOSIS — I1310 Hypertensive heart and chronic kidney disease without heart failure, with stage 1 through stage 4 chronic kidney disease, or unspecified chronic kidney disease: Secondary | ICD-10-CM | POA: Diagnosis not present

## 2023-05-11 DIAGNOSIS — Z0000 Encounter for general adult medical examination without abnormal findings: Secondary | ICD-10-CM | POA: Diagnosis not present

## 2023-05-11 DIAGNOSIS — I700 Atherosclerosis of aorta: Secondary | ICD-10-CM | POA: Diagnosis not present

## 2023-05-11 DIAGNOSIS — R7309 Other abnormal glucose: Secondary | ICD-10-CM

## 2023-05-11 DIAGNOSIS — E66812 Obesity, class 2: Secondary | ICD-10-CM

## 2023-05-11 DIAGNOSIS — F17211 Nicotine dependence, cigarettes, in remission: Secondary | ICD-10-CM

## 2023-05-11 DIAGNOSIS — N1831 Chronic kidney disease, stage 3a: Secondary | ICD-10-CM | POA: Diagnosis not present

## 2023-05-11 DIAGNOSIS — Z6839 Body mass index (BMI) 39.0-39.9, adult: Secondary | ICD-10-CM

## 2023-05-11 DIAGNOSIS — J432 Centrilobular emphysema: Secondary | ICD-10-CM | POA: Diagnosis not present

## 2023-05-11 NOTE — Patient Instructions (Signed)
 Hypertension, Adult Hypertension is another name for high blood pressure. High blood pressure forces your heart to work harder to pump blood. This can cause problems over time. There are two numbers in a blood pressure reading. There is a top number (systolic) over a bottom number (diastolic). It is best to have a blood pressure that is below 120/80. What are the causes? The cause of this condition is not known. Some other conditions can lead to high blood pressure. What increases the risk? Some lifestyle factors can make you more likely to develop high blood pressure: Smoking. Not getting enough exercise or physical activity. Being overweight. Having too much fat, sugar, calories, or salt (sodium) in your diet. Drinking too much alcohol. Other risk factors include: Having any of these conditions: Heart disease. Diabetes. High cholesterol. Kidney disease. Obstructive sleep apnea. Having a family history of high blood pressure and high cholesterol. Age. The risk increases with age. Stress. What are the signs or symptoms? High blood pressure may not cause symptoms. Very high blood pressure (hypertensive crisis) may cause: Headache. Fast or uneven heartbeats (palpitations). Shortness of breath. Nosebleed. Vomiting or feeling like you may vomit (nauseous). Changes in how you see. Very bad chest pain. Feeling dizzy. Seizures. How is this treated? This condition is treated by making healthy lifestyle changes, such as: Eating healthy foods. Exercising more. Drinking less alcohol. Your doctor may prescribe medicine if lifestyle changes do not help enough and if: Your top number is above 130. Your bottom number is above 80. Your personal target blood pressure may vary. Follow these instructions at home: Eating and drinking  If told, follow the DASH eating plan. To follow this plan: Fill one half of your plate at each meal with fruits and vegetables. Fill one fourth of your plate  at each meal with whole grains. Whole grains include whole-wheat pasta, brown rice, and whole-grain bread. Eat or drink low-fat dairy products, such as skim milk or low-fat yogurt. Fill one fourth of your plate at each meal with low-fat (lean) proteins. Low-fat proteins include fish, chicken without skin, eggs, beans, and tofu. Avoid fatty meat, cured and processed meat, or chicken with skin. Avoid pre-made or processed food. Limit the amount of salt in your diet to less than 1,500 mg each day. Do not drink alcohol if: Your doctor tells you not to drink. You are pregnant, may be pregnant, or are planning to become pregnant. If you drink alcohol: Limit how much you have to: 0-1 drink a day for women. 0-2 drinks a day for men. Know how much alcohol is in your drink. In the [place] one drink equals one 12 oz bottle of beer (355 mL), one 5 oz glass of wine (148 mL), or one 1 oz glass of hard liquor (44 mL). Lifestyle  Work with your doctor to stay at a healthy weight or to lose weight. Ask your doctor what the best weight is for you. Get at least 30 minutes of exercise that causes your heart to beat faster (aerobic exercise) most days of the week. This may include walking, swimming, or biking. Get at least 30 minutes of exercise that strengthens your muscles (resistance exercise) at least 3 days a week. This may include lifting weights or doing Pilates. Do not smoke or use any products that contain nicotine or tobacco. If you need help quitting, ask your doctor. Check your blood pressure at home as told by your doctor. Keep all follow-up visits. Medicines Take over-the-counter and prescription medicines  only as told by your doctor. Follow directions carefully. Do not skip doses of blood pressure medicine. The medicine does not work as well if you skip doses. Skipping doses also puts you at risk for problems. Ask your doctor about side effects or reactions to medicines that you should watch  for. Contact a doctor if: You think you are having a reaction to the medicine you are taking. You have headaches that keep coming back. You feel dizzy. You have swelling in your ankles. You have trouble with your vision. Get help right away if: You get a very bad headache. You start to feel mixed up (confused). You feel weak or numb. You feel faint. You have very bad pain in your: Chest. Belly (abdomen). You vomit more than once. You have trouble breathing. These symptoms may be an emergency. Get help right away. Call 911. Do not wait to see if the symptoms will go away. Do not drive yourself to the hospital. Summary Hypertension is another name for high blood pressure. High blood pressure forces your heart to work harder to pump blood. For most people, a normal blood pressure is less than 120/80. Making healthy choices can help lower blood pressure. If your blood pressure does not get lower with healthy choices, you may need to take medicine. This information is not intended to replace advice given to you by your health care provider. Make sure you discuss any questions you have with your health care provider. Document Revised: 11/20/2020 Document Reviewed: 11/20/2020 Elsevier Patient Education  2024 [employer].

## 2023-05-11 NOTE — Progress Notes (Signed)
 Subjective:   Vanessa Ward is a 78 y.o. who presents for a Medicare Wellness preventive visit.  Visit Complete: In person    Persons Participating in Visit: Patient.  AWV Questionnaire: No: Patient Medicare AWV questionnaire was not completed prior to this visit.  Cardiac Risk Factors include: advanced age (>26men, >54 women);dyslipidemia;hypertension     Objective:    Today's Vitals   05/11/23 1509  BP: 122/70  Pulse: 76  Temp: 98.9 F (37.2 C)  TempSrc: Oral  SpO2: 98%  Weight: 233 lb 9.6 oz (106 kg)  Height: 5\' 4"  (1.626 m)   Body mass index is 40.1 kg/m.     05/11/2023    3:28 PM 05/05/2022    2:46 PM 04/22/2021    2:43 PM 01/03/2020    2:43 PM 12/27/2018   10:46 AM 12/21/2017   11:02 AM 02/06/2014   10:11 AM  Advanced Directives  Does Patient Have a Medical Advance Directive? No No No No No No No  Would patient like information on creating a medical advance directive?  No - Patient declined No - Patient declined No - Patient declined  Yes (MAU/Ambulatory/Procedural Areas - Information given) No - patient declined information    Current Medications (verified) Outpatient Encounter Medications as of 05/11/2023  Medication Sig   albuterol (VENTOLIN HFA) 108 (90 Base) MCG/ACT inhaler Inhale 2 puffs into the lungs every 6 (six) hours as needed for wheezing or shortness of breath.   aspirin EC 81 MG tablet Take 81 mg by mouth daily.   Calcium-Vitamin D-Vitamin K (CALCIUM + D PO) Take 2 tablets by mouth every other day.   Cholecalciferol (DIALYVITE VITAMIN D 5000 PO) Take by mouth. Takes every other day   EQ LORATADINE 10 MG tablet TAKE 1 TABLET BY MOUTH ONCE DAILY AS NEEDED FOR ALLERGIES   lisinopril-hydrochlorothiazide (ZESTORETIC) 20-25 MG tablet Take 1 tablet by mouth once daily   omeprazole (PRILOSEC) 40 MG capsule Take 1 capsule by mouth once daily   rosuvastatin (CRESTOR) 10 MG tablet Take 1 tablet (10 mg total) by mouth daily.   Tiotropium  Bromide-Olodaterol (STIOLTO RESPIMAT) 2.5-2.5 MCG/ACT AERS Inhale 2 puffs into the lungs daily. (Patient not taking: Reported on 05/11/2023)   No facility-administered encounter medications on file as of 05/11/2023.    Allergies (verified) Other   History: Past Medical History:  Diagnosis Date   Arthritis    Chronic kidney disease    resent diagnosis of hematuria due to benigh kidney tumor   GERD (gastroesophageal reflux disease)    Hypertension    Shingles    Past Surgical History:  Procedure Laterality Date   BIOPSY  10/15/2011   Procedure: BIOPSY;  Surgeon: Antionette Char, MD;  Location: [facility];  Service: Gynecology;;  Cervical   CERVICAL CONIZATION W/BX  12/17/2011   Procedure: CONIZATION CERVIX WITH BIOPSY;  Surgeon: Antionette Char, MD;  Location: [facility];  Service: Gynecology;  Laterality: N/A;   COLPOSCOPY  10/15/2011   Procedure: COLPOSCOPY;  Surgeon: Antionette Char, MD;  Location: [facility];  Service: Gynecology;  Laterality: N/A;   DIAGNOSTIC LAPAROSCOPY     TUBAL LIGATION     Family History  Problem Relation Age of Onset   Hypertension Mother    Heart disease Mother    Hypertension Father    Diabetes Paternal Grandmother    Social History   Socioeconomic History   Marital status: Single    Spouse name: Not on file   Number of children: Not on  file   Years of education: Not on file   Highest education level: Not on file  Occupational History   Not on file  Tobacco Use   Smoking status: Former    Current packs/day: 0.00    Average packs/day: 0.5 packs/day for 49.0 years (24.5 ttl pk-yrs)    Types: Cigarettes, Pipe    Start date: 30    Quit date: 2016    Years since quitting: 9.2   Smokeless tobacco: Never   Tobacco comments:    Quit 8 years ago,   Vaping Use   Vaping status: Never Used  Substance and Sexual Activity   Alcohol use: Yes    Alcohol/week: 2.0 standard drinks of alcohol    Types: 2 Standard drinks or equivalent per week   Drug  use: No   Sexual activity: Not Currently    Birth control/protection: Post-menopausal  Other Topics Concern   Not on file  Social History Narrative   Not on file   Social Drivers of Health   Financial Resource Strain: Low Risk  (05/11/2023)   Overall Financial Resource Strain (CARDIA)    Difficulty of Paying Living Expenses: Not hard at all  Food Insecurity: No Food Insecurity (05/11/2023)   Hunger Vital Sign    Worried About Running Out of Food in the Last Year: Never true    Ran Out of Food in the Last Year: Never true  Transportation Needs: No Transportation Needs (05/11/2023)   PRAPARE - Administrator, Civil Service (Medical): No    Lack of Transportation (Non-Medical): No  Physical Activity: Inactive (05/11/2023)   Exercise Vital Sign    Days of Exercise per Week: 0 days    Minutes of Exercise per Session: 0 min  Stress: No Stress Concern Present (05/11/2023)   [name]    Feeling of Stress : Not at all  Social Connections: Socially Isolated (05/11/2023)   Social Connection and Isolation Panel [NHANES]    Frequency of Communication with Friends and Family: More than three times a week    Frequency of Social Gatherings with Friends and Family: More than three times a week    Attends Religious Services: Never    Database administrator or Organizations: No    Attends Engineer, structural: Never    Marital Status: Never married    Tobacco Counseling Counseling given: Not Answered Tobacco comments: Quit 8 years ago,     Clinical Intake:  Pre-visit preparation completed: Yes  Pain : No/denies pain     Nutritional Status: BMI > 30  Obese Nutritional Risks: None Diabetes: No  Lab Results  Component Value Date   HGBA1C 6.2 (H) 07/29/2022   HGBA1C 6.1 (H) 05/05/2022   HGBA1C 5.9 (H) 12/15/2021     How often do you need to have someone help you when you read instructions,  pamphlets, or other written materials from your doctor or pharmacy?: 1 - Never  Interpreter Needed?: No  Information entered by :: NAllen LPN   Activities of Daily Living     05/11/2023    3:11 PM  In your present state of health, do you have any difficulty performing the following activities:  Hearing? 0  Vision? 0  Difficulty concentrating or making decisions? 0  Walking or climbing stairs? 0  Dressing or bathing? 0  Doing errands, shopping? 0  Preparing Food and eating ? N  Using the Toilet? N  In the  past six months, have you accidently leaked urine? N  Do you have problems with loss of bowel control? N  Managing your Medications? N  Managing your Finances? N  Housekeeping or managing your Housekeeping? N    Patient Care Team: Dorothyann Peng, MD as PCP - General (Internal Medicine) Jodelle Red, MD as PCP - Cardiology (Cardiology)  Indicate any recent Medical Services you may have received from other than [facility] providers in the past year (date may be approximate).     Assessment:   This is a routine wellness examination for Liann.  Hearing/Vision screen Hearing Screening - Comments:: Denies hearing issues Vision Screening - Comments:: No regular eye exams, [facility]   Goals Addressed             This Visit's Progress    Patient Stated       05/11/2023, wants to breathe       Depression Screen     05/11/2023    3:32 PM 07/15/2022    4:02 PM 05/05/2022    2:50 PM 12/15/2021    3:59 PM 04/22/2021    2:44 PM 01/03/2020    2:43 PM 01/03/2020    2:08 PM  PHQ 2/9 Scores  PHQ - 2 Score 1 0 0 0 0 0 0  PHQ- 9 Score  0         Fall Risk     05/11/2023    3:31 PM 07/15/2022    4:02 PM 05/05/2022    2:49 PM 12/15/2021    3:59 PM 04/22/2021    2:43 PM  Fall Risk   Falls in the past year? 0 0 0 1 0  Number falls in past yr: 0 0 0 0   Injury with Fall? 0 0 0 0   Risk for fall due to : Medication side effect No Fall Risks Medication side effect No  Fall Risks Medication side effect  Follow up Falls prevention discussed;Falls evaluation completed Falls evaluation completed Falls prevention discussed;Education provided;Falls evaluation completed Falls evaluation completed Falls evaluation completed;Education provided;Falls prevention discussed    MEDICARE RISK AT HOME:  Medicare Risk at Home Any stairs in or around the home?: Yes If so, are there any without handrails?: Yes Home free of loose throw rugs in walkways, pet beds, electrical cords, etc?: Yes Adequate lighting in your home to reduce risk of falls?: Yes Life alert?: No Use of a cane, walker or w/c?: No Grab bars in the bathroom?: No Shower chair or bench in shower?: No Elevated toilet seat or a handicapped toilet?: Yes  TIMED UP AND GO:  Was the test performed?  Yes  Length of time to ambulate 10 feet: 5 sec Gait slow and steady without use of assistive device  Cognitive Function: 6CIT completed        05/11/2023    3:32 PM 05/05/2022    2:52 PM 04/22/2021    2:48 PM 01/03/2020    2:45 PM 12/27/2018   10:49 AM  6CIT Screen  What Year? 0 points 0 points 0 points 0 points 0 points  What month? 0 points 0 points 0 points 0 points 0 points  What time? 0 points 0 points 0 points 0 points 0 points  Count back from 20 0 points 0 points 0 points 0 points 0 points  Months in reverse 2 points 0 points 0 points 0 points 0 points  Repeat phrase 0 points 0 points 0 points 0 points 2 points  Total  Score 2 points 0 points 0 points 0 points 2 points    Immunizations Immunization History  Administered Date(s) Administered   Fluad Quad(high Dose 65+) 01/15/2020, 10/09/2020   Influenza, High Dose Seasonal PF 12/21/2017, 12/27/2018   Influenza-Unspecified 11/18/2021   Moderna Covid-19 Vaccine Bivalent Booster 70yrs & up 11/18/2021   PFIZER(Purple Top)SARS-COV-2 Vaccination 04/12/2019, 05/08/2019, 12/01/2019   Pneumococcal Conjugate-13 07/12/2013   Pneumococcal  Polysaccharide-23 04/16/2010   Tdap 06/12/2012, 07/01/2022   Zoster Recombinant(Shingrix) 10/09/2020, 04/04/2021   Zoster, Live 09/25/2013    Screening Tests Health Maintenance  Topic Date Due   Pneumonia Vaccine 35+ Years old (3 of 3 - PPSV23 or PCV20) 04/16/2015   COVID-19 Vaccine (5 - 2024-25 season) 10/17/2022   Lung Cancer Screening  08/30/2023   Medicare Annual Wellness (AWV)  05/10/2024   DTaP/Tdap/Td (3 - Td or Tdap) 06/30/2032   INFLUENZA VACCINE  Completed   DEXA SCAN  Completed   Hepatitis C Screening  Completed   Zoster Vaccines- Shingrix  Completed   HPV VACCINES  Aged Out   Colonoscopy  Discontinued   Fecal DNA (Cologuard)  Discontinued    Health Maintenance  Health Maintenance Due  Topic Date Due   Pneumonia Vaccine 30+ Years old (3 of 3 - PPSV23 or PCV20) 04/16/2015   COVID-19 Vaccine (5 - 2024-25 season) 10/17/2022   Health Maintenance Items Addressed: Due for covid vaccine  Additional Screening:  Vision Screening: Recommended annual ophthalmology exams for early detection of glaucoma and other disorders of the eye.  Dental Screening: Recommended annual dental exams for proper oral hygiene  Community Resource Referral / Chronic Care Management: CRR required this visit?  No   CCM required this visit?  No     Plan:     I have personally reviewed and noted the following in the patient's chart:   Medical and social history Use of alcohol, tobacco or illicit drugs  Current medications and supplements including opioid prescriptions. Patient is not currently taking opioid prescriptions. Functional ability and status Nutritional status Physical activity Advanced directives List of other physicians Hospitalizations, surgeries, and ER visits in previous 12 months Vitals Screenings to include cognitive, depression, and falls Referrals and appointments  In addition, I have reviewed and discussed with patient certain preventive protocols, quality  metrics, and best practice recommendations. A written personalized care plan for preventive services as well as general preventive health recommendations were provided to patient.     Barb Merino, LPN   1/61/0960   After Visit Summary: (In Person-Printed) AVS printed and given to the patient  Notes: Nothing significant to report at this time.

## 2023-05-11 NOTE — Progress Notes (Signed)
 I,Victoria T Deloria Lair, CMA,acting as a Neurosurgeon for Gwynneth Aliment, MD.,have documented all relevant documentation on the behalf of Gwynneth Aliment, MD,as directed by  Gwynneth Aliment, MD while in the presence of Gwynneth Aliment, MD.  Subjective:  Patient ID: Vanessa Ward , female    DOB: 10-28-1945 , 78 y.o.   MRN: [id number]  Chief Complaint  Patient presents with   Hypertension    HPI  Patient presents today for a bp check. She reports compliance with meds. She denies headaches, chest pain and palpitation & worsening SOB.  She has no specific concerns at this time. Unfortunately, her best friend passed and her funeral is this weekend.   AWV completed with [facility] Advisor: [place]ke[place].       Hypertension This is a chronic problem. The current episode started more than 1 year ago. The problem has been gradually improving since onset. The problem is controlled. Pertinent negatives include no blurred vision or palpitations. Risk factors for coronary artery disease include dyslipidemia, obesity, post-menopausal state and sedentary lifestyle. Past treatments include ACE inhibitors and diuretics. The current treatment provides moderate improvement. Compliance problems include exercise.  Hypertensive end-organ damage includes kidney disease.     Past Medical History:  Diagnosis Date   Arthritis    Chronic kidney disease    resent diagnosis of hematuria due to benigh kidney tumor   GERD (gastroesophageal reflux disease)    Hypertension    Shingles      Family History  Problem Relation Age of Onset   Hypertension Mother    Heart disease Mother    Hypertension Father    Diabetes Paternal Grandmother      Current Outpatient Medications:    albuterol (VENTOLIN HFA) 108 (90 Base) MCG/ACT inhaler, Inhale 2 puffs into the lungs every 6 (six) hours as needed for wheezing or shortness of breath., Disp: 8 g, Rfl: 6   aspirin EC 81 MG tablet, Take 81 mg by mouth daily., Disp: , Rfl:     Calcium-Vitamin D-Vitamin K (CALCIUM + D PO), Take 2 tablets by mouth every other day., Disp: , Rfl:    Cholecalciferol (DIALYVITE VITAMIN D 5000 PO), Take by mouth. Takes every other day, Disp: , Rfl:    EQ LORATADINE 10 MG tablet, TAKE 1 TABLET BY MOUTH ONCE DAILY AS NEEDED FOR ALLERGIES, Disp: 30 tablet, Rfl: 0   lisinopril-hydrochlorothiazide (ZESTORETIC) 20-25 MG tablet, Take 1 tablet by mouth once daily, Disp: 90 tablet, Rfl: 0   omeprazole (PRILOSEC) 40 MG capsule, Take 1 capsule by mouth once daily, Disp: 90 capsule, Rfl: 2   rosuvastatin (CRESTOR) 10 MG tablet, Take 1 tablet (10 mg total) by mouth daily., Disp: 90 tablet, Rfl: 2   Tiotropium Bromide-Olodaterol (STIOLTO RESPIMAT) 2.5-2.5 MCG/ACT AERS, Inhale 2 puffs into the lungs daily. (Patient not taking: Reported on 05/11/2023), Disp: 4 g, Rfl: 11   Allergies  Allergen Reactions   Other Rash    Perfume, scented lotion     Review of Systems  Constitutional: Negative.   Eyes:  Negative for blurred vision.  Respiratory: Negative.    Cardiovascular: Negative.  Negative for palpitations.  Gastrointestinal: Negative.   Neurological: Negative.   Psychiatric/Behavioral: Negative.       Today's Vitals   05/11/23 1517  BP: 122/70  Pulse: 76  Temp: 98.9 F (37.2 C)  SpO2: 98%  Weight: 233 lb (105.7 kg)  Height: 5\' 4"  (1.626 m)   Body mass index is 39.99 kg/m.  Wt  Readings from Last 3 Encounters:  05/11/23 233 lb (105.7 kg)  05/11/23 233 lb 9.6 oz (106 kg)  09/29/22 229 lb (103.9 kg)     Objective:  Physical Exam Vitals and nursing note reviewed.  Constitutional:      Appearance: Normal appearance. She is obese.  HENT:     Head: Normocephalic and atraumatic.  Eyes:     Extraocular Movements: Extraocular movements intact.  Cardiovascular:     Rate and Rhythm: Normal rate and regular rhythm.     Heart sounds: Normal heart sounds.  Pulmonary:     Effort: Pulmonary effort is normal.     Breath sounds: Normal breath  sounds.  Musculoskeletal:     Cervical back: Normal range of motion.  Skin:    General: Skin is warm.  Neurological:     General: No focal deficit present.     Mental Status: She is alert.  Psychiatric:        Mood and Affect: Mood normal.        Behavior: Behavior normal.         Assessment And Plan:  Hypertensive heart and renal disease with renal failure, stage 1 through stage 4 or unspecified chronic kidney disease, without heart failure Assessment & Plan: Chronic, well controlled.  She will continue with lisinopril 20/25mg  daily. She is encouraged to follow low sodium diet. She will f/u in four to six months for re-evaluation.   Orders: -     CBC -     CMP14+EGFR -     Lipid panel  Atherosclerosis of aorta (HCC) Assessment & Plan: Chronic, LDL goal is less than 70.  She is encouraged to follow heart healthy lifestyle. Clean eating, regular exercise and stress management are all a part of this lifestyle. She is also encouraged to continue with rosuvastatin 10mg  daily.    Orders: -     TSH  Stage 3a chronic kidney disease (HCC) Assessment & Plan: Chronic, reminded to avoid NSAIDs, stay well hydrated and to keep BP well controlled to decrease risk of CKD progression.    Orders: -     CMP14+EGFR  Centrilobular emphysema (HCC) Assessment & Plan: Chronic, she has been evaluated by Pulmonary.  She is not taking Stiolto that was prescribed, she feels it was ineffective.    Cigarette nicotine dependence in remission Assessment & Plan: She is due for LDCT in July 2025. Order placed, previous results reviewed in detail.   Orders: -     CT CHEST LUNG CANCER SCREENING LOW DOSE WO CONTRAST; Future  Other abnormal glucose Assessment & Plan: Previous labs reviewed, her A1c has been elevated in the past. I will check an A1c today. Reminded to avoid refined sugars including sugary drinks/foods and processed meats including bacon, sausages and deli meats.    Orders: -      CMP14+EGFR -     Hemoglobin A1c  Class 2 severe obesity due to excess calories with serious comorbidity and body mass index (BMI) of 39.0 to 39.9 in adult [facility]) Assessment & Plan: She has gained 4lbs since August 2025. She is encouraged to gradually increase her exercise, aiming for at least 150 minutes of exercise per week.    She is encouraged to strive for BMI less than 30 to decrease cardiac risk. Advised to aim for at least 150 minutes of exercise per week.    Return in 6 months (on 11/11/2023), or physical, for 1 year ov w thn & RS bp check.  Patient was given opportunity to ask questions. Patient verbalized understanding of the plan and was able to repeat key elements of the plan. All questions were answered to their satisfaction.    I, Gwynneth Aliment, MD, have reviewed all documentation for this visit. The documentation on 05/11/23 for the exam, diagnosis, procedures, and orders are all accurate and complete.  IF YOU HAVE BEEN REFERRED TO A SPECIALIST, IT MAY TAKE 1-2 WEEKS TO SCHEDULE/PROCESS THE REFERRAL. IF YOU HAVE NOT HEARD FROM US/SPECIALIST IN TWO WEEKS, PLEASE GIVE [place] A CALL AT [phone].   THE PATIENT IS ENCOURAGED TO PRACTICE SOCIAL DISTANCING DUE TO THE COVID-19 PANDEMIC.

## 2023-05-11 NOTE — Patient Instructions (Signed)
 Vanessa Ward , Thank you for taking time to come for your Medicare Wellness Visit. I appreciate your ongoing commitment to your health goals. Please review the following plan we discussed and let me know if I can assist you in the future.   Referrals/Orders/Follow-Ups/Clinician Recommendations: none  This is a list of the screening recommended for you and due dates:  Health Maintenance  Topic Date Due   COVID-19 Vaccine (5 - 2024-25 season) 05/27/2023*   Screening for Lung Cancer  08/30/2023   Medicare Annual Wellness Visit  05/10/2024   DTaP/Tdap/Td vaccine (3 - Td or Tdap) 06/30/2032   Pneumonia Vaccine  Completed   Flu Shot  Completed   DEXA scan (bone density measurement)  Completed   Hepatitis C Screening  Completed   Zoster (Shingles) Vaccine  Completed   HPV Vaccine  Aged Out   Colon Cancer Screening  Discontinued   Cologuard (Stool DNA test)  Discontinued  *Topic was postponed. The date shown is not the original due date.    Advanced directives: (Provided) Advance directive discussed with you today. I have provided a copy for you to complete at home and have notarized. Once this is complete, please bring a copy in to our office so we can scan it into your chart.   Next Medicare Annual Wellness Visit scheduled for next year: Yes  insert Preventive Care attachment Insert FALL PREVENTION attachment if needed

## 2023-05-12 LAB — CMP14+EGFR
ALT: 12 IU/L (ref 0–32)
AST: 17 IU/L (ref 0–40)
Albumin: 4.3 g/dL (ref 3.8–4.8)
Alkaline Phosphatase: 64 IU/L (ref 44–121)
BUN/Creatinine Ratio: 34 — ABNORMAL HIGH (ref 12–28)
BUN: 41 mg/dL — ABNORMAL HIGH (ref 8–27)
Bilirubin Total: 0.3 mg/dL (ref 0.0–1.2)
CO2: 22 mmol/L (ref 20–29)
Calcium: 9.4 mg/dL (ref 8.7–10.3)
Chloride: 105 mmol/L (ref 96–106)
Creatinine, Ser: 1.19 mg/dL — ABNORMAL HIGH (ref 0.57–1.00)
Globulin, Total: 3 g/dL (ref 1.5–4.5)
Glucose: 88 mg/dL (ref 70–99)
Potassium: 4.5 mmol/L (ref 3.5–5.2)
Sodium: 140 mmol/L (ref 134–144)
Total Protein: 7.3 g/dL (ref 6.0–8.5)
eGFR: 47 mL/min/{1.73_m2} — ABNORMAL LOW (ref >59)

## 2023-05-12 LAB — LIPID PANEL
Chol/HDL Ratio: 2.7 ratio (ref 0.0–4.4)
Cholesterol, Total: 162 mg/dL (ref 100–199)
HDL: 59 mg/dL (ref >39)
LDL Chol Calc (NIH): 81 mg/dL (ref 0–99)
Triglycerides: 124 mg/dL (ref 0–149)
VLDL Cholesterol Cal: 22 mg/dL (ref 5–40)

## 2023-05-12 LAB — CBC
Hematocrit: 35.8 % (ref 34.0–46.6)
Hemoglobin: 10.9 g/dL — ABNORMAL LOW (ref 11.1–15.9)
MCH: 25.2 pg — ABNORMAL LOW (ref 26.6–33.0)
MCHC: 30.4 g/dL — ABNORMAL LOW (ref 31.5–35.7)
MCV: 83 fL (ref 79–97)
Platelets: 306 10*3/uL (ref 150–450)
RBC: 4.33 x10E6/uL (ref 3.77–5.28)
RDW: 13.7 % (ref 11.7–15.4)
WBC: 8 10*3/uL (ref 3.4–10.8)

## 2023-05-12 LAB — HEMOGLOBIN A1C
Est. average glucose Bld gHb Est-mCnc: 126 mg/dL
Hgb A1c MFr Bld: 6 % — ABNORMAL HIGH (ref 4.8–5.6)

## 2023-05-12 LAB — TSH: TSH: 0.767 u[IU]/mL (ref 0.450–4.500)

## 2023-05-15 NOTE — Assessment & Plan Note (Signed)
 Chronic, well controlled.  She will continue with lisinopril 20/25mg  daily. She is encouraged to follow low sodium diet. She will f/u in four to six months for re-evaluation.

## 2023-05-15 NOTE — Assessment & Plan Note (Signed)
 Chronic, she has been evaluated by Pulmonary.  She is not taking Stiolto that was prescribed, she feels it was ineffective.

## 2023-05-15 NOTE — Assessment & Plan Note (Signed)
 She is due for LDCT in July 2025. Order placed, previous results reviewed in detail.

## 2023-05-15 NOTE — Assessment & Plan Note (Signed)
 She has gained 4lbs since August 2025. She is encouraged to gradually increase her exercise, aiming for at least 150 minutes of exercise per week.

## 2023-05-15 NOTE — Assessment & Plan Note (Signed)
 Previous labs reviewed, her A1c has been elevated in the past. I will check an A1c today. Reminded to avoid refined sugars including sugary drinks/foods and processed meats including bacon, sausages and deli meats.

## 2023-05-15 NOTE — Assessment & Plan Note (Signed)
 Chronic, LDL goal is less than 70.  She is encouraged to follow heart healthy lifestyle. Clean eating, regular exercise and stress management are all a part of this lifestyle. She is also encouraged to continue with rosuvastatin 10mg  daily.

## 2023-05-15 NOTE — Assessment & Plan Note (Signed)
 Chronic, reminded to avoid NSAIDs, stay well hydrated and to keep BP well controlled to decrease risk of CKD progression.

## 2023-05-16 ENCOUNTER — Encounter: Payer: Self-pay | Admitting: Internal Medicine

## 2023-05-20 ENCOUNTER — Other Ambulatory Visit: Payer: Self-pay

## 2023-05-20 ENCOUNTER — Inpatient Hospital Stay (HOSPITAL_COMMUNITY)
Admission: EM | Admit: 2023-05-20 | Discharge: 2023-05-22 | DRG: 392 | Disposition: A | Discharge status: Home / Self Care | Attending: Internal Medicine | Admitting: Internal Medicine

## 2023-05-20 ENCOUNTER — Ambulatory Visit (INDEPENDENT_AMBULATORY_CARE_PROVIDER_SITE_OTHER): Admitting: Radiology

## 2023-05-20 ENCOUNTER — Emergency Department (HOSPITAL_COMMUNITY)

## 2023-05-20 ENCOUNTER — Ambulatory Visit
Admission: EM | Admit: 2023-05-20 | Discharge: 2023-05-20 | Disposition: A | Discharge status: Home / Self Care | Source: Home / Self Care

## 2023-05-20 ENCOUNTER — Encounter (HOSPITAL_COMMUNITY): Payer: Self-pay

## 2023-05-20 DIAGNOSIS — Z8249 Family history of ischemic heart disease and other diseases of the circulatory system: Secondary | ICD-10-CM

## 2023-05-20 DIAGNOSIS — Z7982 Long term (current) use of aspirin: Secondary | ICD-10-CM | POA: Diagnosis not present

## 2023-05-20 DIAGNOSIS — M1990 Unspecified osteoarthritis, unspecified site: Secondary | ICD-10-CM | POA: Diagnosis present

## 2023-05-20 DIAGNOSIS — I129 Hypertensive chronic kidney disease with stage 1 through stage 4 chronic kidney disease, or unspecified chronic kidney disease: Secondary | ICD-10-CM | POA: Diagnosis not present

## 2023-05-20 DIAGNOSIS — I700 Atherosclerosis of aorta: Secondary | ICD-10-CM | POA: Diagnosis present

## 2023-05-20 DIAGNOSIS — N1831 Chronic kidney disease, stage 3a: Secondary | ICD-10-CM | POA: Diagnosis not present

## 2023-05-20 DIAGNOSIS — J432 Centrilobular emphysema: Secondary | ICD-10-CM | POA: Diagnosis not present

## 2023-05-20 DIAGNOSIS — Z833 Family history of diabetes mellitus: Secondary | ICD-10-CM | POA: Diagnosis not present

## 2023-05-20 DIAGNOSIS — D1771 Benign lipomatous neoplasm of kidney: Secondary | ICD-10-CM | POA: Diagnosis not present

## 2023-05-20 DIAGNOSIS — Z87891 Personal history of nicotine dependence: Secondary | ICD-10-CM

## 2023-05-20 DIAGNOSIS — K449 Diaphragmatic hernia without obstruction or gangrene: Secondary | ICD-10-CM | POA: Diagnosis not present

## 2023-05-20 DIAGNOSIS — D3501 Benign neoplasm of right adrenal gland: Secondary | ICD-10-CM | POA: Diagnosis not present

## 2023-05-20 DIAGNOSIS — K8020 Calculus of gallbladder without cholecystitis without obstruction: Secondary | ICD-10-CM | POA: Diagnosis not present

## 2023-05-20 DIAGNOSIS — K5732 Diverticulitis of large intestine without perforation or abscess without bleeding: Secondary | ICD-10-CM | POA: Diagnosis not present

## 2023-05-20 DIAGNOSIS — I10 Essential (primary) hypertension: Secondary | ICD-10-CM | POA: Diagnosis not present

## 2023-05-20 DIAGNOSIS — N390 Urinary tract infection, site not specified: Secondary | ICD-10-CM | POA: Diagnosis present

## 2023-05-20 DIAGNOSIS — Z6839 Body mass index (BMI) 39.0-39.9, adult: Secondary | ICD-10-CM

## 2023-05-20 DIAGNOSIS — B9620 Unspecified Escherichia coli [E. coli] as the cause of diseases classified elsewhere: Secondary | ICD-10-CM | POA: Diagnosis present

## 2023-05-20 DIAGNOSIS — K5792 Diverticulitis of intestine, part unspecified, without perforation or abscess without bleeding: Secondary | ICD-10-CM | POA: Diagnosis not present

## 2023-05-20 DIAGNOSIS — K5720 Diverticulitis of large intestine with perforation and abscess without bleeding: Secondary | ICD-10-CM | POA: Diagnosis not present

## 2023-05-20 DIAGNOSIS — K219 Gastro-esophageal reflux disease without esophagitis: Secondary | ICD-10-CM | POA: Diagnosis not present

## 2023-05-20 DIAGNOSIS — E785 Hyperlipidemia, unspecified: Secondary | ICD-10-CM | POA: Diagnosis present

## 2023-05-20 DIAGNOSIS — R1030 Lower abdominal pain, unspecified: Secondary | ICD-10-CM

## 2023-05-20 DIAGNOSIS — E66812 Obesity, class 2: Secondary | ICD-10-CM | POA: Diagnosis present

## 2023-05-20 LAB — CBC
HCT: 32.1 % — ABNORMAL LOW (ref 36.0–46.0)
Hemoglobin: 9.8 g/dL — ABNORMAL LOW (ref 12.0–15.0)
MCH: 25.3 pg — ABNORMAL LOW (ref 26.0–34.0)
MCHC: 30.5 g/dL (ref 30.0–36.0)
MCV: 82.9 fL (ref 80.0–100.0)
Platelets: 292 10*3/uL (ref 150–400)
RBC: 3.87 MIL/uL (ref 3.87–5.11)
RDW: 15.2 % (ref 11.5–15.5)
WBC: 12.8 10*3/uL — ABNORMAL HIGH (ref 4.0–10.5)
nRBC: 0 % (ref 0.0–0.2)

## 2023-05-20 LAB — COMPREHENSIVE METABOLIC PANEL WITH GFR
ALT: 20 U/L (ref 0–44)
AST: 21 U/L (ref 15–41)
Albumin: 3.2 g/dL — ABNORMAL LOW (ref 3.5–5.0)
Alkaline Phosphatase: 44 U/L (ref 38–126)
Anion gap: 8 (ref 5–15)
BUN: 33 mg/dL — ABNORMAL HIGH (ref 8–23)
CO2: 23 mmol/L (ref 22–32)
Calcium: 8.9 mg/dL (ref 8.9–10.3)
Chloride: 108 mmol/L (ref 98–111)
Creatinine, Ser: 1.55 mg/dL — ABNORMAL HIGH (ref 0.44–1.00)
GFR, Estimated: 35 mL/min — ABNORMAL LOW (ref >60)
Glucose, Bld: 126 mg/dL — ABNORMAL HIGH (ref 70–99)
Potassium: 3.6 mmol/L (ref 3.5–5.1)
Sodium: 139 mmol/L (ref 135–145)
Total Bilirubin: 0.9 mg/dL (ref 0.0–1.2)
Total Protein: 7.5 g/dL (ref 6.5–8.1)

## 2023-05-20 LAB — POCT URINALYSIS DIP (MANUAL ENTRY)
Bilirubin, UA: NEGATIVE
Glucose, UA: NEGATIVE mg/dL
Ketones, POC UA: NEGATIVE mg/dL
Nitrite, UA: NEGATIVE
Spec Grav, UA: 1.02 (ref 1.010–1.025)
Urobilinogen, UA: 0.2 U/dL
pH, UA: 5.5 (ref 5.0–8.0)

## 2023-05-20 LAB — URINALYSIS, ROUTINE W REFLEX MICROSCOPIC
Bilirubin Urine: NEGATIVE
Glucose, UA: NEGATIVE mg/dL
Ketones, ur: NEGATIVE mg/dL
Nitrite: NEGATIVE
Protein, ur: NEGATIVE mg/dL
Specific Gravity, Urine: 1.019 (ref 1.005–1.030)
pH: 5 (ref 5.0–8.0)

## 2023-05-20 LAB — LIPASE, BLOOD: Lipase: 29 U/L (ref 11–51)

## 2023-05-20 MED ORDER — IOHEXOL 350 MG/ML SOLN
60.0000 mL | Freq: Once | INTRAVENOUS | Status: AC | PRN
  Administered 2023-05-20: 60 mL via INTRAVENOUS

## 2023-05-20 MED ORDER — LACTATED RINGERS IV BOLUS
1000.0000 mL | Freq: Once | INTRAVENOUS | Status: AC
  Administered 2023-05-20: 1000 mL via INTRAVENOUS

## 2023-05-20 MED ORDER — METRONIDAZOLE 500 MG/100ML IV SOLN
500.0000 mg | Freq: Once | INTRAVENOUS | Status: AC
  Administered 2023-05-21: 500 mg via INTRAVENOUS
  Filled 2023-05-20: qty 100

## 2023-05-20 MED ORDER — MORPHINE SULFATE (PF) 4 MG/ML IV SOLN
4.0000 mg | Freq: Once | INTRAVENOUS | Status: AC
  Administered 2023-05-20: 4 mg via INTRAVENOUS
  Filled 2023-05-20: qty 1

## 2023-05-20 MED ORDER — MORPHINE SULFATE (PF) 4 MG/ML IV SOLN: 4.0000 mg | INTRAVENOUS | Status: DC | PRN

## 2023-05-20 MED ORDER — SODIUM CHLORIDE 0.9 % IV SOLN
2.0000 g | Freq: Once | INTRAVENOUS | Status: AC
  Administered 2023-05-21: 2 g via INTRAVENOUS
  Filled 2023-05-20: qty 12.5

## 2023-05-20 MED ORDER — LACTATED RINGERS IV BOLUS
1000.0000 mL | Freq: Once | INTRAVENOUS | Status: AC
  Administered 2023-05-21: 1000 mL via INTRAVENOUS

## 2023-05-20 NOTE — ED Triage Notes (Signed)
 Pt here for abd pain and shob that started a week ago. Denies n/v/d/fevers. Denies CP

## 2023-05-20 NOTE — ED Provider Notes (Signed)
 [facility]    CSN: [id number] Arrival date & time: 05/20/23  1054      History   Chief Complaint Chief Complaint  Patient presents with   Abdominal Pain    HPI Vanessa Ward is a 78 y.o. female.   The history is provided by the patient.  Abdominal Pain Abdominal suprapubic pain onset 1 week ago, pain described as crampy, pain is constant but waxes and wanes in intensity at worst 10 out of 10 currently 4 out of 10.  Pain seems to be worse when she eats, no palliative factors.  She admits constipation x 1 week.  Last bowel movement today but not normal.  Mitts decreased appetite. Denies fever, chills, sweats, nausea, vomiting, diarrhea, bloody stool, tarry colored stools, urinary symptoms, vaginal discharge or bleeding, history of similar symptoms in the past.  Has had a tubal ligation.  Has never had a colonoscopy but has had multiple Cologuard tests normal per patient report  Past Medical History:  Diagnosis Date   Arthritis    Chronic kidney disease    resent diagnosis of hematuria due to benigh kidney tumor   GERD (gastroesophageal reflux disease)    Hypertension    Shingles     Patient Active Problem List   Diagnosis Date Noted   Bilateral buttock pain 07/26/2022   Arthralgia 07/26/2022   Stage 3a chronic kidney disease (HCC) 07/26/2022   Centrilobular emphysema (HCC) 12/15/2021   Dyspnea on exertion 07/23/2020   Atherosclerosis of aorta (HCC) 07/09/2019   Cigarette nicotine dependence in remission 07/09/2019   Other abnormal glucose 07/09/2019   Chronic renal disease, stage II 07/09/2019   Hypertensive heart and renal disease 07/09/2019   Class 2 severe obesity due to excess calories with serious comorbidity and body mass index (BMI) of 39.0 to 39.9 in adult (HCC) 01/18/2018   Essential hypertension, benign 01/04/2018   Hyperlipidemia 12/03/2017   Cervical dysplasia, moderate 12/17/2011   Cervical polyp 10/15/2011   Papanicolaou smear of cervix  with low grade squamous intraepithelial lesion (LGSIL) 10/15/2011    Past Surgical History:  Procedure Laterality Date   BIOPSY  10/15/2011   Procedure: BIOPSY;  Surgeon: Antionette Char, MD;  Location: [facility];  Service: Gynecology;;  Cervical   CERVICAL CONIZATION W/BX  12/17/2011   Procedure: CONIZATION CERVIX WITH BIOPSY;  Surgeon: Antionette Char, MD;  Location: [facility];  Service: Gynecology;  Laterality: N/A;   COLPOSCOPY  10/15/2011   Procedure: COLPOSCOPY;  Surgeon: Antionette Char, MD;  Location: [facility];  Service: Gynecology;  Laterality: N/A;   DIAGNOSTIC LAPAROSCOPY     TUBAL LIGATION      OB History   No obstetric history on file.      Home Medications    Prior to Admission medications   Medication Sig Start Date End Date Taking? Authorizing Provider  albuterol (VENTOLIN HFA) 108 (90 Base) MCG/ACT inhaler Inhale 2 puffs into the lungs every 6 (six) hours as needed for wheezing or shortness of breath. 09/29/22   Martina Sinner, MD  aspirin EC 81 MG tablet Take 81 mg by mouth daily.    [provider]  Calcium-Vitamin D-Vitamin K (CALCIUM + D PO) Take 2 tablets by mouth every other day.    [provider]  Cholecalciferol (DIALYVITE VITAMIN D 5000 PO) Take by mouth. Takes every other day    [provider]  EQ LORATADINE 10 MG tablet TAKE 1 TABLET BY MOUTH ONCE DAILY AS NEEDED FOR ALLERGIES 08/27/22  Dorothyann Peng, MD  lisinopril-hydrochlorothiazide (ZESTORETIC) 20-25 MG tablet Take 1 tablet by mouth once daily 03/29/23   Dorothyann Peng, MD  omeprazole (PRILOSEC) 40 MG capsule Take 1 capsule by mouth once daily 04/22/23   Dorothyann Peng, MD  rosuvastatin (CRESTOR) 10 MG tablet Take 1 tablet (10 mg total) by mouth daily. 03/04/23   Dorothyann Peng, MD  Tiotropium Bromide-Olodaterol (STIOLTO RESPIMAT) 2.5-2.5 MCG/ACT AERS Inhale 2 puffs into the lungs daily. Patient not taking: Reported on 05/11/2023 03/03/22   Martina Sinner, MD    Family  History Family History  Problem Relation Age of Onset   Hypertension Mother    Heart disease Mother    Hypertension Father    Diabetes Paternal Grandmother     Social History Social History   Tobacco Use   Smoking status: Former    Current packs/day: 0.00    Average packs/day: 0.5 packs/day for 49.0 years (24.5 ttl pk-yrs)    Types: Cigarettes, Pipe    Start date: 70    Quit date: 2016    Years since quitting: 9.2   Smokeless tobacco: Never   Tobacco comments:    Quit 8 years ago,   Vaping Use   Vaping status: Never Used  Substance Use Topics   Alcohol use: Yes    Alcohol/week: 2.0 standard drinks of alcohol    Types: 2 Standard drinks or equivalent per week   Drug use: No     Allergies   Other   Review of Systems Review of Systems  Gastrointestinal:  Positive for abdominal pain.     Physical Exam Triage Vital Signs ED Triage Vitals  Encounter Vitals Group     BP 05/20/23 1115 (!) 93/59     Systolic BP Percentile --      Diastolic BP Percentile --      Pulse Rate 05/20/23 1115 90     Resp 05/20/23 1115 18     Temp 05/20/23 1115 98.7 F (37.1 C)     Temp Source 05/20/23 1115 Oral     SpO2 05/20/23 1115 95 %     Weight 05/20/23 1116 225 lb (102.1 kg)     Height 05/20/23 1116 5\' 4"  (1.626 m)     Head Circumference --      Peak Flow --      Pain Score 05/20/23 1116 7     Pain Loc --      Pain Education --      Exclude from Growth Chart --    No data found.  Updated Vital Signs BP (!) 93/59 (BP Location: Right Arm)   Pulse 90   Temp 98.7 F (37.1 C) (Oral)   Resp 18   Ht 5\' 4"  (1.626 m)   Wt 225 lb (102.1 kg)   SpO2 95%   BMI 38.62 kg/m   Visual Acuity Right Eye Distance:   Left Eye Distance:   Bilateral Distance:    Right Eye Near:   Left Eye Near:    Bilateral Near:     Physical Exam Vitals and nursing note reviewed.  Constitutional:      Appearance: She is not ill-appearing.  HENT:     Head: Normocephalic and atraumatic.   Cardiovascular:     Rate and Rhythm: Normal rate and regular rhythm.  Pulmonary:     Breath sounds: Normal breath sounds. No wheezing or rales.  Abdominal:     General: Bowel sounds are decreased.     Palpations: Abdomen is soft.  Tenderness: There is abdominal tenderness in the suprapubic area. There is no guarding or rebound.  Skin:    General: Skin is warm and dry.  Neurological:     Mental Status: She is alert.      UC Treatments / Results  Labs (all labs ordered are listed, but only abnormal results are displayed) Labs Reviewed  POCT URINALYSIS DIP (MANUAL ENTRY) - Abnormal; Notable for the following components:      Result Value   Clarity, UA turbid (*)    Blood, UA small (*)    Protein Ur, POC trace (*)    Leukocytes, UA Small (1+) (*)    All other components within normal limits  URINE CULTURE    EKG   Radiology No results found.  Procedures Procedures (including critical care time)  Medications Ordered in UC Medications - No data to display  Initial Impression / Assessment and Plan / UC Course  I have reviewed the triage vital signs and the nursing notes.  Pertinent labs & imaging results that were available during my care of the patient were reviewed by me and considered in my medical decision making (see chart for details).     78 year old female reports lower abdominal pain for 1 week associated with decreased bowel movements.  She initially had low blood pressure but repeat blood pressure was fine, she has lost 8 pounds since her visit 05/11/2023, she has suprapubic tenderness that is vague.  Obstruction series obtained decreased calcified lesions and questionable stool in the suprapubic region, point-of-care urine dipstick suspicious for UTI will send urine culture. Discussed with patient feels she needs to go to ED for possible CT scan, concern for obstruction Final Clinical Impressions(s) / UC Diagnoses   Final diagnoses:  Lower abdominal pain    Discharge Instructions   None    ED Prescriptions   None    PDMP not reviewed this encounter.   Meliton Rattan, [place] 05/20/23 1215

## 2023-05-20 NOTE — Discharge Instructions (Signed)
 Go to the emergency department for further evaluation and treatment.

## 2023-05-20 NOTE — ED Notes (Signed)
 Patient transported to CT via stretcher.

## 2023-05-20 NOTE — ED Notes (Signed)
 Marylene Land PA made aware of pt's orthostatic vital signs.

## 2023-05-20 NOTE — ED Notes (Signed)
 Patient is being discharged from the Urgent Care and sent to the Emergency Department via private vehicle . Per Marylene Land PA, patient is in need of higher level of care due to lower abdominal pain & orthostatics. Patient is aware and verbalizes understanding of plan of care.  Vitals:   05/20/23 1115 05/20/23 1150  BP: (!) 93/59 117/83  Pulse: 90   Resp: 18   Temp: 98.7 F (37.1 C)   SpO2: 95%

## 2023-05-20 NOTE — ED Provider Notes (Signed)
 [place] [facility] AT [facility] Provider Note   CSN: [id number] Arrival date & time: 05/20/23  1725     History  Chief Complaint  Patient presents with   Abdominal Pain    Vanessa Ward is a 78 y.o. female.  HPI    78 year old female comes in with chief complaint of abdominal pain. Patient has history of CKD, hypertension, tubal ligation surgical history.  She indicates that she has been having abdominal pain in the lower quadrant for the last week.  Her pain is constant, and has overtime become more intense and severe.  She denies any burning with urination, urinary frequency.  Patient has had abnormal bowel movements, but no blood in the stool.  No history of similar pain in the past.  She went to the urgent care, and was advised to come to the ER because of severity of pain.   Home Medications Prior to Admission medications   Medication Sig Start Date End Date Taking? Authorizing Provider  albuterol (VENTOLIN HFA) 108 (90 Base) MCG/ACT inhaler Inhale 2 puffs into the lungs every 6 (six) hours as needed for wheezing or shortness of breath. 09/29/22   Martina Sinner, MD  aspirin EC 81 MG tablet Take 81 mg by mouth daily.    [provider]  Calcium-Vitamin D-Vitamin K (CALCIUM + D PO) Take 2 tablets by mouth every other day.    [provider]  Cholecalciferol (DIALYVITE VITAMIN D 5000 PO) Take by mouth. Takes every other day    [provider]  EQ LORATADINE 10 MG tablet TAKE 1 TABLET BY MOUTH ONCE DAILY AS NEEDED FOR ALLERGIES 08/27/22   Dorothyann Peng, MD  lisinopril-hydrochlorothiazide (ZESTORETIC) 20-25 MG tablet Take 1 tablet by mouth once daily 03/29/23   Dorothyann Peng, MD  omeprazole (PRILOSEC) 40 MG capsule Take 1 capsule by mouth once daily 04/22/23   Dorothyann Peng, MD  rosuvastatin (CRESTOR) 10 MG tablet Take 1 tablet (10 mg total) by mouth daily. 03/04/23   Dorothyann Peng, MD  Tiotropium Bromide-Olodaterol  (STIOLTO RESPIMAT) 2.5-2.5 MCG/ACT AERS Inhale 2 puffs into the lungs daily. Patient not taking: Reported on 05/11/2023 03/03/22   Martina Sinner, MD      Allergies    Other    Review of Systems   Review of Systems  All other systems reviewed and are negative.   Physical Exam Updated Vital Signs BP (!) 111/90 (BP Location: Left Arm)   Pulse (!) 102   Temp 98.4 F (36.9 C)   Resp 18   Ht 5\' 4"  (1.626 m)   Wt 102.1 kg   SpO2 100%   BMI 38.62 kg/m  Physical Exam Vitals and nursing note reviewed.  Constitutional:      Appearance: She is well-developed.  HENT:     Head: Atraumatic.  Cardiovascular:     Rate and Rhythm: Normal rate.  Pulmonary:     Effort: Pulmonary effort is normal.  Abdominal:     Tenderness: There is abdominal tenderness in the right lower quadrant, suprapubic area and left lower quadrant. There is guarding. There is no rebound.  Musculoskeletal:     Cervical back: Normal range of motion and neck supple.  Skin:    General: Skin is warm and dry.  Neurological:     Mental Status: She is alert and oriented to person, place, and time.     ED Results / Procedures / Treatments   Labs (all labs ordered are listed, but  only abnormal results are displayed) Labs Reviewed  COMPREHENSIVE METABOLIC PANEL WITH GFR - Abnormal; Notable for the following components:      Result Value   Glucose, Bld 126 (*)    BUN 33 (*)    Creatinine, Ser 1.55 (*)    Albumin 3.2 (*)    GFR, Estimated 35 (*)    All other components within normal limits  CBC - Abnormal; Notable for the following components:   WBC 12.8 (*)    Hemoglobin 9.8 (*)    HCT 32.1 (*)    MCH 25.3 (*)    All other components within normal limits  URINALYSIS, ROUTINE W REFLEX MICROSCOPIC - Abnormal; Notable for the following components:   APPearance HAZY (*)    Hgb urine dipstick MODERATE (*)    Leukocytes,Ua MODERATE (*)    Bacteria, UA MANY (*)    All other components within normal limits   CULTURE, BLOOD (ROUTINE X 2)  CULTURE, BLOOD (ROUTINE X 2)  LIPASE, BLOOD    EKG None  Radiology CT ABDOMEN PELVIS W CONTRAST Result Date: 05/20/2023 CLINICAL DATA:  Lower abdominal pain. EXAM: CT ABDOMEN AND PELVIS WITH CONTRAST TECHNIQUE: Multidetector CT imaging of the abdomen and pelvis was performed using the standard protocol following bolus administration of intravenous contrast. RADIATION DOSE REDUCTION: This exam was performed according to the departmental dose-optimization program which includes automated exposure control, adjustment of the mA and/or kV according to patient size and/or use of iterative reconstruction technique. CONTRAST:  60mL OMNIPAQUE IOHEXOL 350 MG/ML SOLN COMPARISON:  CT abdomen and pelvis 04/28/2011 FINDINGS: Lower chest: No acute abnormality. Hepatobiliary: Gallstones are present. There is no biliary ductal dilatation. There are several rounded hypodense lesions in the liver measuring up to 18 mm which are favored as cysts. These are similar to the prior study. Pancreas: Unremarkable. No pancreatic ductal dilatation or surrounding inflammatory changes. Spleen: Normal in size without focal abnormality. Adrenals/Urinary Tract: There is a stable right adrenal adenoma measuring 9 mm. The left adrenal gland is within normal limits. Fat containing lesion in the inferior pole of the left kidney measures 1 cm and appears grossly unchanged. There is no hydronephrosis or perinephric fat stranding. The bladder is within normal limits. Stomach/Bowel: There is sigmoid and descending colon diverticulosis. There is wall thickening and inflammation of the mid sigmoid colon compatible with acute diverticulitis. Findings suspicious for micro perforation without abscess formation. No bowel obstruction. The appendix is not visualized. Small bowel is within normal limits. There is a small hiatal hernia. The stomach is decompressed. Vascular/Lymphatic: Aortic atherosclerosis. No enlarged  abdominal or pelvic lymph nodes. Reproductive: Weekly depth there is an anterior calcified uterine fibroid measuring 13 mm. The ovaries are nonenlarged. Skip Other: There is trace free fluid in the pelvis. Musculoskeletal: No acute or significant osseous findings. IMPRESSION: 1. Acute sigmoid colon diverticulitis with likely micro perforation. No abscess. Follow-up colonoscopy the recommended to exclude underlying pathology at this level. 2.  Cholelithiasis. 3.  Small hiatal hernia. 4.  Unchanged left renal angiomyolipoma and right adrenal adenoma. 5.  Calcified uterine fibroid. Electronically Signed   By: Darliss Cheney M.D.   On: 05/20/2023 23:03   DG Abd Acute W/Chest Result Date: 05/20/2023 CLINICAL DATA:  Intermittent lower abdominal pain. EXAM: DG ABDOMEN ACUTE WITH 1 VIEW CHEST COMPARISON:  Chest CT dated 08/25/2022. FINDINGS: No focal consolidation, pleural effusion or pneumothorax. The cardiac silhouette is within limits. No acute osseous pathology. No bowel dilatation or evidence of obstruction. No free air.  Gallstones noted in the right upper quadrant. Degenerative changes of the spine. Small calcified fibroid. No acute osseous pathology. IMPRESSION: 1. No acute cardiopulmonary process. 2. Nonobstructive bowel gas pattern. 3. Cholelithiasis. Electronically Signed   By: Elgie Collard M.D.   On: 05/20/2023 12:54    Procedures .Critical Care  Performed by: Derwood Kaplan, MD Authorized by: Derwood Kaplan, MD   Critical care provider statement:    Critical care time (minutes):  32   Critical care was necessary to treat or prevent imminent or life-threatening deterioration of the following conditions: Perforated diverticulitis.   Critical care was time spent personally by me on the following activities:  Development of treatment plan with patient or surrogate, discussions with consultants, evaluation of patient's response to treatment, examination of patient, ordering and review of laboratory  studies, ordering and review of radiographic studies, ordering and performing treatments and interventions, pulse oximetry, re-evaluation of patient's condition, review of old charts and obtaining history from patient or surrogate     Medications Ordered in ED Medications  ceFEPIme (MAXIPIME) 2 g in sodium chloride 0.9 % 100 mL IVPB (has no administration in time range)    And  metroNIDAZOLE (FLAGYL) IVPB 500 mg (has no administration in time range)  morphine (PF) 4 MG/ML injection 4 mg (has no administration in time range)  lactated ringers bolus 1,000 mL (has no administration in time range)  lactated ringers bolus 1,000 mL (1,000 mLs Intravenous New Bag/Given 05/20/23 2107)  morphine (PF) 4 MG/ML injection 4 mg (4 mg Intravenous Given 05/20/23 2156)  iohexol (OMNIPAQUE) 350 MG/ML injection 60 mL (60 mLs Intravenous Contrast Given 05/20/23 2245)    ED Course/ Medical Decision Making/ A&P                                 Medical Decision Making Amount and/or Complexity of Data Reviewed Labs: ordered. Radiology: ordered.  Risk Prescription drug management. Decision regarding hospitalization.    This patient presents to the ED with chief complaint(s) of abdominal pain with pertinent past medical history of CKD.The complaint involves an extensive differential diagnosis and also carries with it a high risk of complications and morbidity.    I saw the patient in triage.  She is complaining of lower quadrant pain, she was seen by urgent care and sent to the ER.  Her urine shows positive bacteriuria.  I requested that the patient be pulled back as potential quick discharge, however upon getting history it appears that it is possible that she has appendicitis, diverticulitis or intra-abdominal infection as a cause.  The differential diagnosis includes appendicitis, appendicitis with complication such as perforation, diverticulitis, diverticulitis with complication such as perforation or abscess,  acute cystitis.  The initial plan is to get CT abdomen pelvis with contrast.  Patient does have CKD.  Her creatinine is slightly elevated and the creatinine she had previously. Patient has mild tachycardia. She denies any fevers.  White count is 12.6.  Clinically, it does not appear that she is septic at this time.    Additional history obtained: Additional history obtained from family Records reviewed Primary Care Documents  Independent labs interpretation:  The following labs were independently interpreted: Elevated white count of 12.6.  Creatinine is 1.55, it was 1.2 previously.  UA with bacteriuria.  Lipase is fine.  Independent visualization and interpretation of imaging: - I independently visualized the following imaging with scope of interpretation limited to determining acute  life threatening conditions related to emergency care: CT abdomen pelvis with contrast, which revealed no evidence of large free air.  However patient does have diverticulitis with possible microperforation.  Based on exam, patient did have some guarding.  Family indicates that patient has not slept well the last couple of nights.  My suspicion is that this is likely diverticulitis with perforation, but no abscess.  We will proceed with admission.  Cefepime and Flagyl ordered.  Pain medicine ordered.  She has received 1 L of IV fluid, will add 1 more liter of fluid now.  Results of the ED workup discussed with the patient along with the plan to admit. I do not think surgical consultation is needed at this time.  Surgery can be consulted if patient is worsening by the admitting team. Final Clinical Impression(s) / ED Diagnoses Final diagnoses:  Perforated diverticulum of large intestine    Rx / DC Orders ED Discharge Orders     None         Derwood Kaplan, MD 05/20/23 2357

## 2023-05-20 NOTE — ED Triage Notes (Addendum)
 Pt presents with complaints of intermittent lower abdominal pain x 1 week. Last BM this morning however pt feels like she is constipated. Currently rates her overall pain a 7/10, describes as cramping. Decrease in appetite.Tylenol taken with little relief. Denies urinary symptoms.

## 2023-05-21 DIAGNOSIS — Z8249 Family history of ischemic heart disease and other diseases of the circulatory system: Secondary | ICD-10-CM | POA: Diagnosis not present

## 2023-05-21 DIAGNOSIS — I129 Hypertensive chronic kidney disease with stage 1 through stage 4 chronic kidney disease, or unspecified chronic kidney disease: Secondary | ICD-10-CM | POA: Diagnosis present

## 2023-05-21 DIAGNOSIS — B9620 Unspecified Escherichia coli [E. coli] as the cause of diseases classified elsewhere: Secondary | ICD-10-CM | POA: Diagnosis present

## 2023-05-21 DIAGNOSIS — M1990 Unspecified osteoarthritis, unspecified site: Secondary | ICD-10-CM | POA: Diagnosis present

## 2023-05-21 DIAGNOSIS — Z7982 Long term (current) use of aspirin: Secondary | ICD-10-CM | POA: Diagnosis not present

## 2023-05-21 DIAGNOSIS — D1771 Benign lipomatous neoplasm of kidney: Secondary | ICD-10-CM | POA: Diagnosis present

## 2023-05-21 DIAGNOSIS — J432 Centrilobular emphysema: Secondary | ICD-10-CM | POA: Diagnosis present

## 2023-05-21 DIAGNOSIS — R1030 Lower abdominal pain, unspecified: Secondary | ICD-10-CM | POA: Diagnosis present

## 2023-05-21 DIAGNOSIS — Z87891 Personal history of nicotine dependence: Secondary | ICD-10-CM | POA: Diagnosis not present

## 2023-05-21 DIAGNOSIS — K5792 Diverticulitis of intestine, part unspecified, without perforation or abscess without bleeding: Secondary | ICD-10-CM | POA: Diagnosis not present

## 2023-05-21 DIAGNOSIS — Z6839 Body mass index (BMI) 39.0-39.9, adult: Secondary | ICD-10-CM | POA: Diagnosis not present

## 2023-05-21 DIAGNOSIS — N1831 Chronic kidney disease, stage 3a: Secondary | ICD-10-CM | POA: Diagnosis present

## 2023-05-21 DIAGNOSIS — K5720 Diverticulitis of large intestine with perforation and abscess without bleeding: Secondary | ICD-10-CM | POA: Diagnosis present

## 2023-05-21 DIAGNOSIS — I700 Atherosclerosis of aorta: Secondary | ICD-10-CM | POA: Diagnosis present

## 2023-05-21 DIAGNOSIS — E785 Hyperlipidemia, unspecified: Secondary | ICD-10-CM | POA: Diagnosis present

## 2023-05-21 DIAGNOSIS — Z833 Family history of diabetes mellitus: Secondary | ICD-10-CM | POA: Diagnosis not present

## 2023-05-21 DIAGNOSIS — D3501 Benign neoplasm of right adrenal gland: Secondary | ICD-10-CM | POA: Diagnosis present

## 2023-05-21 DIAGNOSIS — E66812 Obesity, class 2: Secondary | ICD-10-CM | POA: Diagnosis present

## 2023-05-21 DIAGNOSIS — N390 Urinary tract infection, site not specified: Secondary | ICD-10-CM | POA: Diagnosis present

## 2023-05-21 DIAGNOSIS — K219 Gastro-esophageal reflux disease without esophagitis: Secondary | ICD-10-CM | POA: Diagnosis present

## 2023-05-21 LAB — PHOSPHORUS: Phosphorus: 3.9 mg/dL (ref 2.5–4.6)

## 2023-05-21 LAB — CBC
HCT: 27.4 % — ABNORMAL LOW (ref 36.0–46.0)
Hemoglobin: 8.7 g/dL — ABNORMAL LOW (ref 12.0–15.0)
MCH: 25.5 pg — ABNORMAL LOW (ref 26.0–34.0)
MCHC: 31.8 g/dL (ref 30.0–36.0)
MCV: 80.4 fL (ref 80.0–100.0)
Platelets: 276 10*3/uL (ref 150–400)
RBC: 3.41 MIL/uL — ABNORMAL LOW (ref 3.87–5.11)
RDW: 15.2 % (ref 11.5–15.5)
WBC: 14.6 10*3/uL — ABNORMAL HIGH (ref 4.0–10.5)
nRBC: 0 % (ref 0.0–0.2)

## 2023-05-21 LAB — BASIC METABOLIC PANEL WITH GFR
Anion gap: 10 (ref 5–15)
BUN: 27 mg/dL — ABNORMAL HIGH (ref 8–23)
CO2: 23 mmol/L (ref 22–32)
Calcium: 8.5 mg/dL — ABNORMAL LOW (ref 8.9–10.3)
Chloride: 105 mmol/L (ref 98–111)
Creatinine, Ser: 1.31 mg/dL — ABNORMAL HIGH (ref 0.44–1.00)
GFR, Estimated: 42 mL/min — ABNORMAL LOW (ref >60)
Glucose, Bld: 93 mg/dL (ref 70–99)
Potassium: 3.6 mmol/L (ref 3.5–5.1)
Sodium: 138 mmol/L (ref 135–145)

## 2023-05-21 LAB — MAGNESIUM: Magnesium: 1.3 mg/dL — ABNORMAL LOW (ref 1.7–2.4)

## 2023-05-21 MED ORDER — ASPIRIN 81 MG PO TBEC
81.0000 mg | DELAYED_RELEASE_TABLET | Freq: Every day | ORAL | Status: DC
  Administered 2023-05-21: 81 mg via ORAL
  Filled 2023-05-21 (×2): qty 1

## 2023-05-21 MED ORDER — METRONIDAZOLE 500 MG/100ML IV SOLN
500.0000 mg | Freq: Two times a day (BID) | INTRAVENOUS | Status: DC
  Administered 2023-05-21 (×2): 500 mg via INTRAVENOUS
  Filled 2023-05-21 (×3): qty 100

## 2023-05-21 MED ORDER — ENOXAPARIN SODIUM 40 MG/0.4ML IJ SOSY
40.0000 mg | PREFILLED_SYRINGE | Freq: Every day | INTRAMUSCULAR | Status: DC
  Administered 2023-05-21: 40 mg via SUBCUTANEOUS
  Filled 2023-05-21 (×2): qty 0.4

## 2023-05-21 MED ORDER — MELATONIN 5 MG PO TABS
5.0000 mg | ORAL_TABLET | Freq: Every evening | ORAL | Status: DC | PRN
  Administered 2023-05-21: 5 mg via ORAL
  Filled 2023-05-21: qty 1

## 2023-05-21 MED ORDER — SODIUM CHLORIDE 0.9 % IV SOLN
2.0000 g | Freq: Two times a day (BID) | INTRAVENOUS | Status: DC
  Administered 2023-05-21 (×2): 2 g via INTRAVENOUS
  Filled 2023-05-21 (×3): qty 12.5

## 2023-05-21 MED ORDER — PROCHLORPERAZINE EDISYLATE 10 MG/2ML IJ SOLN: 5.0000 mg | Freq: Four times a day (QID) | INTRAMUSCULAR | Status: DC | PRN

## 2023-05-21 MED ORDER — POLYETHYLENE GLYCOL 3350 17 G PO PACK: 17.0000 g | PACK | Freq: Every day | ORAL | Status: DC | PRN

## 2023-05-21 MED ORDER — ROSUVASTATIN CALCIUM 5 MG PO TABS
10.0000 mg | ORAL_TABLET | Freq: Every day | ORAL | Status: DC
  Administered 2023-05-21: 10 mg via ORAL
  Filled 2023-05-21 (×3): qty 2

## 2023-05-21 MED ORDER — OXYCODONE HCL 5 MG PO TABS
5.0000 mg | ORAL_TABLET | Freq: Four times a day (QID) | ORAL | Status: DC | PRN
  Administered 2023-05-21: 5 mg via ORAL
  Filled 2023-05-21: qty 1

## 2023-05-21 MED ORDER — MAGNESIUM SULFATE 2 GM/50ML IV SOLN
2.0000 g | Freq: Once | INTRAVENOUS | Status: AC
  Administered 2023-05-21: 2 g via INTRAVENOUS
  Filled 2023-05-21: qty 50

## 2023-05-21 MED ORDER — LACTATED RINGERS IV SOLN: INTRAVENOUS | Status: AC

## 2023-05-21 MED ORDER — ACETAMINOPHEN 325 MG PO TABS
650.0000 mg | ORAL_TABLET | Freq: Four times a day (QID) | ORAL | Status: DC | PRN
  Administered 2023-05-21: 650 mg via ORAL
  Filled 2023-05-21: qty 2

## 2023-05-21 MED ORDER — PANTOPRAZOLE SODIUM 40 MG PO TBEC
40.0000 mg | DELAYED_RELEASE_TABLET | Freq: Every day | ORAL | Status: DC
  Administered 2023-05-21: 40 mg via ORAL
  Filled 2023-05-21 (×2): qty 1

## 2023-05-21 NOTE — ED Notes (Signed)
 Pt declined to dress in hospital gown.

## 2023-05-21 NOTE — H&P (Signed)
 History and Physical  Vanessa Ward:[id number] DOB: 1945-07-22 DOA: 05/20/2023  Referring physician: Dr. Rhunette Croft, EDP  PCP: Dorothyann Peng, MD  Outpatient Specialists: None Patient coming from: Home  Chief Complaint: Abdominal pain.  HPI: Vanessa Ward is a 78 y.o. female with medical history significant for hypertension, obesity, who presents with complaints of left lower quadrant abdominal pain that started about a week ago and gradually worsened.  Today the pain was unbearable 15 out of 10.  The patient decided to present to the ER for further evaluation.  She has only been able to tolerate small amount of a liquid diet for the past couple of days due to the worsening of pain after a solid diet.  She has taken Tylenol with minimal relief of her pain.  Denies vomiting or diarrhea.  No reported subjective fevers or chills.  In the ER, tachycardic and tachypneic.  Received IV opioid-based analgesics with improvement of her symptomatology.  CT abdomen and pelvis revealed acute diverticulitis with suspected microperforation and no abscesses.  She was started on IV cefepime and Flagyl.  Admitted by [facility], hospitalist service.  ED Course: Temperature 98.6.  BP 109/92, pulse 80, respiratory rate 20, O2 saturation 100% on room air  Review of Systems: Review of systems as noted in the HPI. All other systems reviewed and are negative.   Past Medical History:  Diagnosis Date   Arthritis    Chronic kidney disease    resent diagnosis of hematuria due to benigh kidney tumor   GERD (gastroesophageal reflux disease)    Hypertension    Shingles    Past Surgical History:  Procedure Laterality Date   BIOPSY  10/15/2011   Procedure: BIOPSY;  Surgeon: Antionette Char, MD;  Location: [facility];  Service: Gynecology;;  Cervical   CERVICAL CONIZATION W/BX  12/17/2011   Procedure: CONIZATION CERVIX WITH BIOPSY;  Surgeon: Antionette Char, MD;  Location: [facility];  Service: Gynecology;  Laterality:  N/A;   COLPOSCOPY  10/15/2011   Procedure: COLPOSCOPY;  Surgeon: Antionette Char, MD;  Location: [facility];  Service: Gynecology;  Laterality: N/A;   DIAGNOSTIC LAPAROSCOPY     TUBAL LIGATION      Social History:  reports that she quit smoking about 9 years ago. Her smoking use included cigarettes and pipe. She started smoking about 58 years ago. She has a 24.5 pack-year smoking history. She has never used smokeless tobacco. She reports current alcohol use of about 2.0 standard drinks of alcohol per week. She reports that she does not use drugs.   Allergies  Allergen Reactions   Other     Perfume, scented lotion - flares up eczema    Family History  Problem Relation Age of Onset   Hypertension Mother    Heart disease Mother    Hypertension Father    Diabetes Paternal Grandmother       Prior to Admission medications   Medication Sig Start Date End Date Taking? Authorizing Provider  albuterol (VENTOLIN HFA) 108 (90 Base) MCG/ACT inhaler Inhale 2 puffs into the lungs every 6 (six) hours as needed for wheezing or shortness of breath. 09/29/22  Yes Martina Sinner, MD  aspirin EC 81 MG tablet Take 81 mg by mouth daily.   Yes [provider]  Cholecalciferol (DIALYVITE VITAMIN D 5000 PO) Take 5,000 Units by mouth daily.   Yes [provider]  EQ LORATADINE 10 MG tablet TAKE 1 TABLET BY MOUTH ONCE DAILY AS NEEDED FOR ALLERGIES 08/27/22  Yes Dorothyann Peng, MD  lisinopril-hydrochlorothiazide (ZESTORETIC) 20-25 MG tablet Take 1 tablet by mouth once daily 03/29/23  Yes Dorothyann Peng, MD  omeprazole (PRILOSEC) 40 MG capsule Take 1 capsule by mouth once daily 04/22/23  Yes Dorothyann Peng, MD  rosuvastatin (CRESTOR) 10 MG tablet Take 1 tablet (10 mg total) by mouth daily. 03/04/23  Yes Dorothyann Peng, MD  Tiotropium Bromide-Olodaterol (STIOLTO RESPIMAT) 2.5-2.5 MCG/ACT AERS Inhale 2 puffs into the lungs daily. Patient not taking: Reported on 05/11/2023 03/03/22   Martina Sinner, MD    Physical Exam: BP (!) 111/90 (BP Location: Left Arm)   Pulse (!) 102   Temp 98.4 F (36.9 C)   Resp 18   Ht 5\' 4"  (1.626 m)   Wt 102.1 kg   SpO2 100%   BMI 38.62 kg/m   General: 78 y.o. year-old female well developed well nourished in no acute distress.  Alert and oriented x3. Cardiovascular: Tachycardic with no rubs or gallops.  No thyromegaly or JVD noted.  No lower extremity edema. 2/4 pulses in all 4 extremities. Respiratory: Clear to auscultation with no wheezes or rales. Good inspiratory effort. Abdomen: Soft left lower quadrant tenderness nondistended with normal bowel sounds x4 quadrants. Muskuloskeletal: No cyanosis, clubbing or edema noted bilaterally Neuro: CN II-XII intact, strength, sensation, reflexes Skin: No ulcerative lesions noted or rashes Psychiatry: Judgement and insight appear normal. Mood is appropriate for condition and setting          Labs on Admission:  Basic Metabolic Panel: Recent Labs  Lab 05/20/23 1744  NA 139  K 3.6  CL 108  CO2 23  GLUCOSE 126*  BUN 33*  CREATININE 1.55*  CALCIUM 8.9   Liver Function Tests: Recent Labs  Lab 05/20/23 1744  AST 21  ALT 20  ALKPHOS 44  BILITOT 0.9  PROT 7.5  ALBUMIN 3.2*   Recent Labs  Lab 05/20/23 1744  LIPASE 29   No results for input(s): "AMMONIA" in the last 168 hours. CBC: Recent Labs  Lab 05/20/23 1744  WBC 12.8*  HGB 9.8*  HCT 32.1*  MCV 82.9  PLT 292   Cardiac Enzymes: No results for input(s): "CKTOTAL", "CKMB", "CKMBINDEX", "TROPONINI" in the last 168 hours.  BNP (last 3 results) No results for input(s): "BNP" in the last 8760 hours.  ProBNP (last 3 results) No results for input(s): "PROBNP" in the last 8760 hours.  CBG: No results for input(s): "GLUCAP" in the last 168 hours.  Radiological Exams on Admission: CT ABDOMEN PELVIS W CONTRAST Result Date: 05/20/2023 CLINICAL DATA:  Lower abdominal pain. EXAM: CT ABDOMEN AND PELVIS WITH CONTRAST TECHNIQUE:  Multidetector CT imaging of the abdomen and pelvis was performed using the standard protocol following bolus administration of intravenous contrast. RADIATION DOSE REDUCTION: This exam was performed according to the departmental dose-optimization program which includes automated exposure control, adjustment of the mA and/or kV according to patient size and/or use of iterative reconstruction technique. CONTRAST:  60mL OMNIPAQUE IOHEXOL 350 MG/ML SOLN COMPARISON:  CT abdomen and pelvis 04/28/2011 FINDINGS: Lower chest: No acute abnormality. Hepatobiliary: Gallstones are present. There is no biliary ductal dilatation. There are several rounded hypodense lesions in the liver measuring up to 18 mm which are favored as cysts. These are similar to the prior study. Pancreas: Unremarkable. No pancreatic ductal dilatation or surrounding inflammatory changes. Spleen: Normal in size without focal abnormality. Adrenals/Urinary Tract: There is a stable right adrenal adenoma measuring 9 mm. The left adrenal gland is within normal limits.  Fat containing lesion in the inferior pole of the left kidney measures 1 cm and appears grossly unchanged. There is no hydronephrosis or perinephric fat stranding. The bladder is within normal limits. Stomach/Bowel: There is sigmoid and descending colon diverticulosis. There is wall thickening and inflammation of the mid sigmoid colon compatible with acute diverticulitis. Findings suspicious for micro perforation without abscess formation. No bowel obstruction. The appendix is not visualized. Small bowel is within normal limits. There is a small hiatal hernia. The stomach is decompressed. Vascular/Lymphatic: Aortic atherosclerosis. No enlarged abdominal or pelvic lymph nodes. Reproductive: Weekly depth there is an anterior calcified uterine fibroid measuring 13 mm. The ovaries are nonenlarged. Skip Other: There is trace free fluid in the pelvis. Musculoskeletal: No acute or significant osseous  findings. IMPRESSION: 1. Acute sigmoid colon diverticulitis with likely micro perforation. No abscess. Follow-up colonoscopy the recommended to exclude underlying pathology at this level. 2.  Cholelithiasis. 3.  Small hiatal hernia. 4.  Unchanged left renal angiomyolipoma and right adrenal adenoma. 5.  Calcified uterine fibroid. Electronically Signed   By: Darliss Cheney M.D.   On: 05/20/2023 23:03   DG Abd Acute W/Chest Result Date: 05/20/2023 CLINICAL DATA:  Intermittent lower abdominal pain. EXAM: DG ABDOMEN ACUTE WITH 1 VIEW CHEST COMPARISON:  Chest CT dated 08/25/2022. FINDINGS: No focal consolidation, pleural effusion or pneumothorax. The cardiac silhouette is within limits. No acute osseous pathology. No bowel dilatation or evidence of obstruction. No free air. Gallstones noted in the right upper quadrant. Degenerative changes of the spine. Small calcified fibroid. No acute osseous pathology. IMPRESSION: 1. No acute cardiopulmonary process. 2. Nonobstructive bowel gas pattern. 3. Cholelithiasis. Electronically Signed   By: Elgie Collard M.D.   On: 05/20/2023 12:54    EKG: I independently viewed the EKG done and my findings are as followed: Sinus tachycardia, QTc 343.  Assessment/Plan Present on Admission:  Acute diverticulitis  Principal Problem:   Acute diverticulitis  Acute sigmoid colon diverticulitis with likely microperforation Continue cefepime and IV Flagyl started in the ER Follow peripheral blood cultures x 2 Monitor fever curve and WBCs Will need to follow-up with GI outpatient for colonoscopy 4-6 weeks post completion of course of antibiotics  Hypertension BPs are stable Monitor vital signs Maintain MAP greater than 65  Hyperlipidemia Resume home Crestor  Obesity BMI 39 Recommend weight loss outpatient with regular physical activity and healthy dieting.  Left renal angiomyolipoma and right adrenal adenoma, unchanged on CT scan    Time, 75 minutes.   DVT  prophylaxis: Subcu Lovenox daily.  Code Status: Full code.  Family Communication: Daughter and granddaughter at bedside.  Disposition Plan: Admitted to MedSurg unit.  Consults called: None.  Admission status: Inpatient status.   Status is: Inpatient The patient quires at least 2 midnights for further evaluation and treatment of present condition.   Darlin Drop MD [facility] Pager [phone]  If 7PM-7AM, please contact night-coverage [email or web address] Password [facility]  05/21/2023, 12:26 AM

## 2023-05-21 NOTE — Progress Notes (Signed)
 PROGRESS NOTE  Vanessa Ward  DOB: 1945/12/19  PCP: Dorothyann Peng, MD RUE:[id number]  DOA: 05/20/2023  LOS: 0 days  Hospital Day: 2  Brief narrative: Vanessa Ward is a 77 y.o. female with PMH significant for obesity, HTN, CKD, arthritis, GERD 4/4, patient presented to the ED with complaint of left lower quadrant abdominal pain that started about a week ago and gradually worsened in severity.  A sitter with nausea, poor intake and pain with bowel movement.  In the ED, patient was afebrile, tachycardic and tachypneic CT abdomen pelvis showed acute diverticulitis of sigmoid colon with likely microperforation, no abscess. Patient was started on IV cefepime and IV Flagyl Admitted to [facility]  Subjective: Patient was seen and examined this morning. Elderly African-American female.  Lying down in bed. Able to tolerate clear liquid diet.  Abdominal pain improving. Had a bowel movement this morning and it was pain-free unlike last few days  Assessment and plan: Acute sigmoid colon diverticulitis with likely microperforation Presented with progressively worsening left lower quadrant abdomen pain  Had colonoscopy in the past with Dr. Loreta Ave 10 years ago.  Denies having any knowledge of diverticulosis.  Never had diverticulitis before.   Currently on IV cefepime and IV Flagyl.  Continue same  Clinically improving.   Continue clear liquid diet for today.  If continues to improve, will advance diet tomorrow. Will need to follow-up with GI outpatient for colonoscopy 4-6 weeks post completion of course of antibiotics   Hypertension PTA meds lisinopril, hydrochlorothiazide Currently blood pressure is good without meds. Continue to monitor.  IV hydralazine as needed  Hypomagnesemia Magnesium level low today.  Replacement ordered. Recent Labs  Lab 05/20/23 1744 05/21/23 0447  K 3.6 3.6  MG  --  1.3*  PHOS  --  3.9   Hyperlipidemia Continue Crestor  Obesity  Body mass index is 39.73  kg/m. Patient has been advised to make an attempt to improve diet and exercise patterns to aid in weight loss.  Left renal angiomyolipoma and right adrenal adenoma unchanged on CT scan Continue to follow-up as an outpatient.     Mobility: Encourage ambulation  Goals of care   Code Status: Full Code     DVT prophylaxis:  enoxaparin (LOVENOX) injection 40 mg Start: 05/21/23 1000   Antimicrobials: IV cefepime, IV Flagyl Fluid: LR at 50 mL/h Consultants: None Family Communication: None at bedside  Status: Inpatient Level of care:  Med-Surg   Patient is from: Home Needs to continue in-hospital care: Currently being monitored on IV antibiotics Anticipated d/c to: Hopefully home in 1 to 2 days      Diet:  Diet Order             Diet clear liquid Room service appropriate? Yes; Fluid consistency: Thin  Diet effective now                   Scheduled Meds:  aspirin EC  81 mg Oral Daily   enoxaparin (LOVENOX) injection  40 mg Subcutaneous Daily   pantoprazole  40 mg Oral Daily   rosuvastatin  10 mg Oral Daily    PRN meds: acetaminophen, melatonin, morphine injection, oxyCODONE, polyethylene glycol, prochlorperazine   Infusions:   ceFEPime (MAXIPIME) IV 2 g (05/21/23 1478)   And   metronidazole 500 mg (05/21/23 1014)   lactated ringers 50 mL/hr at 05/21/23 0251   magnesium sulfate bolus IVPB      Antimicrobials: Anti-infectives (From admission, onward)    Start  Dose/Rate Route Frequency Ordered Stop   05/21/23 1000  ceFEPIme (MAXIPIME) 2 g in sodium chloride 0.9 % 100 mL IVPB       Note to Pharmacy: Cefepime 2 g IV q12 for CrCl < 60 mL/min  Placed in "And" Linked Group   2 g 200 mL/hr over 30 Minutes Intravenous 2 times daily 05/21/23 0643     05/21/23 1000  metroNIDAZOLE (FLAGYL) IVPB 500 mg       Placed in "And" Linked Group   500 mg 100 mL/hr over 60 Minutes Intravenous 2 times daily 05/21/23 0643     05/20/23 2315  ceFEPIme (MAXIPIME) 2 g in  sodium chloride 0.9 % 100 mL IVPB       Placed in "And" Linked Group   2 g 200 mL/hr over 30 Minutes Intravenous  Once 05/20/23 2311 05/21/23 0052   05/20/23 2315  metroNIDAZOLE (FLAGYL) IVPB 500 mg       Placed in "And" Linked Group   500 mg 100 mL/hr over 60 Minutes Intravenous  Once 05/20/23 2311 05/21/23 0200       Objective: Vitals:   05/21/23 0159 05/21/23 0753  BP: (!) 109/92 (!) 116/53  Pulse: 80 77  Resp: 20 16  Temp: 98.6 F (37 C) 98.9 F (37.2 C)  SpO2: 100% 97%    Intake/Output Summary (Last 24 hours) at 05/21/2023 1159 Last data filed at 05/21/2023 0400 Gross per 24 hour  Intake 2249.9 ml  Output --  Net 2249.9 ml   Filed Weights   05/20/23 1741 05/21/23 0159  Weight: 102.1 kg 105 kg   Weight change:  Body mass index is 39.73 kg/m.   Physical Exam: General exam: Pleasant, obese female.  Not in distress Skin: No rashes, lesions or ulcers. HEENT: Atraumatic, normocephalic, no obvious bleeding Lungs: Clear to auscultation bilaterally,  CVS: S1, S2, no murmur,   GI/Abd: Soft, mild tenderness in lower abdomen present, nondistended, bowel sound present,   CNS: Alert, awake, oriented x 3 Psychiatry: Mood appropriate,  Extremities: No pedal edema, no calf tenderness,   Data Review: I have personally reviewed the laboratory data and studies available.  F/u labs ordered Unresulted Labs (From admission, onward)     Start     Ordered   05/28/23 0500  Creatinine, serum  (enoxaparin (LOVENOX)    CrCl >/= 30 ml/min)  Weekly,   R     Comments: while on enoxaparin therapy    05/21/23 0026   05/22/23 0500  Basic metabolic panel with GFR  Tomorrow morning,   R       Question:  Specimen collection method  Answer:  Lab=Lab collect   05/21/23 1159   05/22/23 0500  CBC with Differential/Platelet  Tomorrow morning,   R       Question:  Specimen collection method  Answer:  Lab=Lab collect   05/21/23 1159   05/20/23 2345  Blood culture (routine x 2)  BLOOD CULTURE X  2,   R      05/20/23 2344            Total time spent in review of labs and imaging, patient evaluation, formulation of plan, documentation and communication with family: 45 minutes  Signed, Lorin Glass, MD [facility] 05/21/2023

## 2023-05-21 NOTE — Plan of Care (Signed)

## 2023-05-21 NOTE — ED Notes (Signed)
 5C notified transport at bedside to take pt upstairs.

## 2023-05-22 DIAGNOSIS — K5792 Diverticulitis of intestine, part unspecified, without perforation or abscess without bleeding: Secondary | ICD-10-CM | POA: Diagnosis not present

## 2023-05-22 LAB — CBC WITH DIFFERENTIAL/PLATELET
Abs Immature Granulocytes: 0.02 10*3/uL (ref 0.00–0.07)
Basophils Absolute: 0 10*3/uL (ref 0.0–0.1)
Basophils Relative: 0 %
Eosinophils Absolute: 0.4 10*3/uL (ref 0.0–0.5)
Eosinophils Relative: 5 %
HCT: 29.7 % — ABNORMAL LOW (ref 36.0–46.0)
Hemoglobin: 9.4 g/dL — ABNORMAL LOW (ref 12.0–15.0)
Immature Granulocytes: 0 %
Lymphocytes Relative: 46 %
Lymphs Abs: 3.9 10*3/uL (ref 0.7–4.0)
MCH: 25.5 pg — ABNORMAL LOW (ref 26.0–34.0)
MCHC: 31.6 g/dL (ref 30.0–36.0)
MCV: 80.5 fL (ref 80.0–100.0)
Monocytes Absolute: 0.6 10*3/uL (ref 0.1–1.0)
Monocytes Relative: 7 %
Neutro Abs: 3.6 10*3/uL (ref 1.7–7.7)
Neutrophils Relative %: 42 %
Platelets: 263 10*3/uL (ref 150–400)
RBC: 3.69 MIL/uL — ABNORMAL LOW (ref 3.87–5.11)
RDW: 15 % (ref 11.5–15.5)
WBC: 8.5 10*3/uL (ref 4.0–10.5)
nRBC: 0 % (ref 0.0–0.2)

## 2023-05-22 LAB — BASIC METABOLIC PANEL WITH GFR
Anion gap: 9 (ref 5–15)
BUN: 22 mg/dL (ref 8–23)
CO2: 23 mmol/L (ref 22–32)
Calcium: 8.7 mg/dL — ABNORMAL LOW (ref 8.9–10.3)
Chloride: 107 mmol/L (ref 98–111)
Creatinine, Ser: 1.23 mg/dL — ABNORMAL HIGH (ref 0.44–1.00)
GFR, Estimated: 46 mL/min — ABNORMAL LOW (ref >60)
Glucose, Bld: 88 mg/dL (ref 70–99)
Potassium: 3.6 mmol/L (ref 3.5–5.1)
Sodium: 139 mmol/L (ref 135–145)

## 2023-05-22 LAB — URINE CULTURE: Culture: >=100000 — AB

## 2023-05-22 MED ORDER — CEFDINIR 300 MG PO CAPS
300.0000 mg | ORAL_CAPSULE | Freq: Two times a day (BID) | ORAL | 0 refills | Status: AC
Start: 2023-05-22 — End: 2023-06-01

## 2023-05-22 MED ORDER — METRONIDAZOLE 500 MG PO TABS: 500.0000 mg | ORAL_TABLET | Freq: Two times a day (BID) | ORAL | 0 refills | Status: AC

## 2023-05-22 NOTE — Plan of Care (Signed)

## 2023-05-22 NOTE — Discharge Summary (Signed)
 Physician Discharge Summary  Vanessa Ward:[id number] DOB: 04-03-1945 DOA: 05/20/2023  PCP: Dorothyann Peng, MD  Admit date: 05/20/2023 Discharge date: 05/22/2023  Admitted From: Home Discharge disposition: Home  Recommendations at discharge:  Complete the course of antibiotics with 10 more days of oral Omnicef and oral Flagyl Stay on full liquid diet for next 1 to 2 days and gradually advance as tolerated Follow-up with GI as an outpatient, you may need repeat colonoscopy.   Brief narrative: Vanessa Ward is a 78 y.o. female with PMH significant for obesity, HTN, CKD, arthritis, GERD 4/4, patient presented to the ED with complaint of left lower quadrant abdominal pain that started about a week ago and gradually worsened in severity.  A sitter with nausea, poor intake and pain with bowel movement.  In the ED, patient was afebrile, tachycardic and tachypneic CT abdomen pelvis showed acute diverticulitis of sigmoid colon with likely microperforation, no abscess. Patient was started on IV cefepime and IV Flagyl Admitted to [facility]  Subjective: Patient was seen and examined this morning. Sitting up in recliner.  Not in distress.  Able to tolerate soup last night. Abdominal pain is still there but better. Not getting good sleep at night because of constant noise Wants to go home.  Assessment and plan: Acute sigmoid colon diverticulitis with likely microperforation Presented with progressively worsening left lower quadrant abdomen pain  Had colonoscopy in the past with Dr. Loreta Ave 10 years ago.  Denies having any knowledge of diverticulosis.  Never had diverticulitis before.   She was started on IV cefepime and IV Flagyl.  Will conservative measures, she is clinically improving.  Able to tolerate full liquid diet. Abdominal pain still present but better. Wants to go home. Will discharge on 10 more days of oral Omnicef and Flagyl. I would suggest to continue full liquid diet for  next 1 to 2 days and gradually advance as tolerated. Will need to follow-up with GI outpatient for colonoscopy 4-6 weeks post completion of course of antibiotics.  Have sent a staff message to Dr. Loreta Ave   Hypertension PTA meds lisinopril, hydrochlorothiazide Resume both post discharge  Hyperlipidemia Continue Crestor  Obesity  Body mass index is 39.73 kg/m. Patient has been advised to make an attempt to improve diet and exercise patterns to aid in weight loss.  Left renal angiomyolipoma and right adrenal adenoma unchanged on CT scan Continue to follow-up as an outpatient.     Mobility: Encourage ambulation  Goals of care   Code Status: Full Code    DVT prophylaxis:  enoxaparin (LOVENOX) injection 40 mg Start: 05/21/23 1000   Antimicrobials: Oral Omnicef and oral Flagyl for 10 more days Consultants: None Family Communication: None at bedside  Diet:  Diet Order             Diet general           Diet full liquid Fluid consistency: Thin  Diet effective now                   Nutritional status:  Body mass index is 39.73 kg/m.       Wounds:  -    Discharge Exam:   Vitals:   05/21/23 2051 05/22/23 0014 05/22/23 0100 05/22/23 0423  BP: 117/61 (!) 87/45 (!) 108/58 100/67  Pulse: 74 66  67  Resp: 19 18  18   Temp: 97.9 F (36.6 C) (!) 97.5 F (36.4 C)  98 F (36.7 C)  TempSrc:  Oral  SpO2: 99% 97%  98%  Weight:      Height:        Body mass index is 39.73 kg/m.  General exam: Pleasant, obese female.  Not in distress Skin: No rashes, lesions or ulcers. HEENT: Atraumatic, normocephalic, no obvious bleeding Lungs: Clear to auscultation bilaterally,  CVS: S1, S2, no murmur,   GI/Abd: Soft, improving tenderness in lower abdomen present, nondistended, bowel sound present,   CNS: Alert, awake, oriented x 3 Psychiatry: Mood appropriate,  Extremities: No pedal edema, no calf tenderness,   Follow ups:    Follow-up Information     Dorothyann Peng,  MD Follow up.   Specialty: Internal Medicine Contact information: [address] [place] [address] [phone]         Charna Elizabeth, MD Follow up.   Specialty: Gastroenterology Contact information: [address] [place] [address] [phone]                 Discharge Instructions:   Discharge Instructions     Call MD for:  difficulty breathing, headache or visual disturbances   Complete by: As directed    Call MD for:  extreme fatigue   Complete by: As directed    Call MD for:  hives   Complete by: As directed    Call MD for:  persistant dizziness or light-headedness   Complete by: As directed    Call MD for:  persistant nausea and vomiting   Complete by: As directed    Call MD for:  severe uncontrolled pain   Complete by: As directed    Call MD for:  temperature >100.4   Complete by: As directed    Diet general   Complete by: As directed    Stay on full liquid diet for 1 to 2 days and gradually advance.   Discharge instructions   Complete by: As directed    Recommendations at discharge:   Complete the course of antibiotics with 10 more days of oral Omnicef and oral Flagyl  Stay on full liquid diet for next 1 to 2 days and gradually advance as tolerated  Follow-up with GI as an outpatient, you may need repeat colonoscopy.  General discharge instructions: Follow with Primary MD Dorothyann Peng, MD in 7 days  Please request your PCP  to go over your hospital tests, procedures, radiology results at the follow up. Please get your medicines reviewed and adjusted.  Your PCP may decide to repeat certain labs or tests as needed. Do not drive, operate heavy machinery, perform activities at heights, swimming or participation in water activities or provide baby sitting services if your were admitted for syncope or siezures until you have seen by Primary MD or a Neurologist and advised to do so again. [place] Controlled  Substance Reporting System database was reviewed. Do not drive, operate heavy machinery, perform activities at heights, swim, participate in water activities or provide baby-sitting services while on medications for pain, sleep and mood until your outpatient physician has reevaluated you and advised to do so again.  You are strongly recommended to comply with the dose, frequency and duration of prescribed medications. Activity: As tolerated with Full fall precautions use walker/cane & assistance as needed Avoid using any recreational substances like cigarette, tobacco, alcohol, or non-prescribed drug. If you experience worsening of your admission symptoms, develop shortness of breath, life threatening emergency, suicidal or homicidal thoughts you must seek medical attention immediately by calling 911  or calling your MD immediately  if symptoms less severe. You must read complete instructions/literature along with all the possible adverse reactions/side effects for all the medicines you take and that have been prescribed to you. Take any new medicine only after you have completely understood and accepted all the possible adverse reactions/side effects.  Wear Seat belts while driving. You were cared for by a hospitalist during your hospital stay. If you have any questions about your discharge medications or the care you received while you were in the hospital after you are discharged, you can call the unit and ask to speak with the hospitalist or the covering physician. Once you are discharged, your primary care physician will handle any further medical issues. Please note that NO REFILLS for any discharge medications will be authorized once you are discharged, as it is imperative that you return to your primary care physician (or establish a relationship with a primary care physician if you do not have one).   Increase activity slowly   Complete by: As directed        Discharge Medications:   Allergies  as of 05/22/2023       Reactions   Other    Perfume, scented lotion - flares up eczema        Medication List     TAKE these medications    albuterol 108 (90 Base) MCG/ACT inhaler Commonly known as: VENTOLIN HFA Inhale 2 puffs into the lungs every 6 (six) hours as needed for wheezing or shortness of breath.   aspirin EC 81 MG tablet Take 81 mg by mouth daily.   cefdinir 300 MG capsule Commonly known as: OMNICEF Take 1 capsule (300 mg total) by mouth 2 (two) times daily for 10 days.   DIALYVITE VITAMIN D 5000 PO Take 5,000 Units by mouth daily.   EQ Loratadine 10 MG tablet Generic drug: loratadine TAKE 1 TABLET BY MOUTH ONCE DAILY AS NEEDED FOR ALLERGIES   lisinopril-hydrochlorothiazide 20-25 MG tablet Commonly known as: ZESTORETIC Take 1 tablet by mouth once daily   metroNIDAZOLE 500 MG tablet Commonly known as: Flagyl Take 1 tablet (500 mg total) by mouth 2 (two) times daily for 10 days.   omeprazole 40 MG capsule Commonly known as: PRILOSEC Take 1 capsule by mouth once daily   rosuvastatin 10 MG tablet Commonly known as: CRESTOR Take 1 tablet (10 mg total) by mouth daily.   Stiolto Respimat 2.5-2.5 MCG/ACT Aers Generic drug: Tiotropium Bromide-Olodaterol Inhale 2 puffs into the lungs daily.         The results of significant diagnostics from this hospitalization (including imaging, microbiology, ancillary and laboratory) are listed below for reference.    Procedures and Diagnostic Studies:   CT ABDOMEN PELVIS W CONTRAST Result Date: 05/20/2023 CLINICAL DATA:  Lower abdominal pain. EXAM: CT ABDOMEN AND PELVIS WITH CONTRAST TECHNIQUE: Multidetector CT imaging of the abdomen and pelvis was performed using the standard protocol following bolus administration of intravenous contrast. RADIATION DOSE REDUCTION: This exam was performed according to the departmental dose-optimization program which includes automated exposure control, adjustment of the mA and/or kV  according to patient size and/or use of iterative reconstruction technique. CONTRAST:  60mL OMNIPAQUE IOHEXOL 350 MG/ML SOLN COMPARISON:  CT abdomen and pelvis 04/28/2011 FINDINGS: Lower chest: No acute abnormality. Hepatobiliary: Gallstones are present. There is no biliary ductal dilatation. There are several rounded hypodense lesions in the liver measuring up to 18 mm which are favored as cysts. These are similar to the prior  study. Pancreas: Unremarkable. No pancreatic ductal dilatation or surrounding inflammatory changes. Spleen: Normal in size without focal abnormality. Adrenals/Urinary Tract: There is a stable right adrenal adenoma measuring 9 mm. The left adrenal gland is within normal limits. Fat containing lesion in the inferior pole of the left kidney measures 1 cm and appears grossly unchanged. There is no hydronephrosis or perinephric fat stranding. The bladder is within normal limits. Stomach/Bowel: There is sigmoid and descending colon diverticulosis. There is wall thickening and inflammation of the mid sigmoid colon compatible with acute diverticulitis. Findings suspicious for micro perforation without abscess formation. No bowel obstruction. The appendix is not visualized. Small bowel is within normal limits. There is a small hiatal hernia. The stomach is decompressed. Vascular/Lymphatic: Aortic atherosclerosis. No enlarged abdominal or pelvic lymph nodes. Reproductive: Weekly depth there is an anterior calcified uterine fibroid measuring 13 mm. The ovaries are nonenlarged. Skip Other: There is trace free fluid in the pelvis. Musculoskeletal: No acute or significant osseous findings. IMPRESSION: 1. Acute sigmoid colon diverticulitis with likely micro perforation. No abscess. Follow-up colonoscopy the recommended to exclude underlying pathology at this level. 2.  Cholelithiasis. 3.  Small hiatal hernia. 4.  Unchanged left renal angiomyolipoma and right adrenal adenoma. 5.  Calcified uterine fibroid.  Electronically Signed   By: Darliss Cheney M.D.   On: 05/20/2023 23:03   DG Abd Acute W/Chest Result Date: 05/20/2023 CLINICAL DATA:  Intermittent lower abdominal pain. EXAM: DG ABDOMEN ACUTE WITH 1 VIEW CHEST COMPARISON:  Chest CT dated 08/25/2022. FINDINGS: No focal consolidation, pleural effusion or pneumothorax. The cardiac silhouette is within limits. No acute osseous pathology. No bowel dilatation or evidence of obstruction. No free air. Gallstones noted in the right upper quadrant. Degenerative changes of the spine. Small calcified fibroid. No acute osseous pathology. IMPRESSION: 1. No acute cardiopulmonary process. 2. Nonobstructive bowel gas pattern. 3. Cholelithiasis. Electronically Signed   By: Elgie Collard M.D.   On: 05/20/2023 12:54     Labs:   Basic Metabolic Panel: Recent Labs  Lab 05/20/23 1744 05/21/23 0447 05/22/23 0418  NA 139 138 139  K 3.6 3.6 3.6  CL 108 105 107  CO2 23 23 23   GLUCOSE 126* 93 88  BUN 33* 27* 22  CREATININE 1.55* 1.31* 1.23*  CALCIUM 8.9 8.5* 8.7*  MG  --  1.3*  --   PHOS  --  3.9  --    GFR Estimated Creatinine Clearance: 45.9 mL/min (A) (by C-G formula based on SCr of 1.23 mg/dL (H)). Liver Function Tests: Recent Labs  Lab 05/20/23 1744  AST 21  ALT 20  ALKPHOS 44  BILITOT 0.9  PROT 7.5  ALBUMIN 3.2*   Recent Labs  Lab 05/20/23 1744  LIPASE 29   No results for input(s): "AMMONIA" in the last 168 hours. Coagulation profile No results for input(s): "INR", "PROTIME" in the last 168 hours.  CBC: Recent Labs  Lab 05/20/23 1744 05/21/23 0447 05/22/23 0418  WBC 12.8* 14.6* 8.5  NEUTROABS  --   --  3.6  HGB 9.8* 8.7* 9.4*  HCT 32.1* 27.4* 29.7*  MCV 82.9 80.4 80.5  PLT 292 276 263   Cardiac Enzymes: No results for input(s): "CKTOTAL", "CKMB", "CKMBINDEX", "TROPONINI" in the last 168 hours. BNP: Invalid input(s): "POCBNP" CBG: No results for input(s): "GLUCAP" in the last 168 hours. D-Dimer No results for input(s):  "DDIMER" in the last 72 hours. Hgb A1c No results for input(s): "HGBA1C" in the last 72 hours. Lipid Profile No results  for input(s): "CHOL", "HDL", "LDLCALC", "TRIG", "CHOLHDL", "LDLDIRECT" in the last 72 hours. Thyroid function studies No results for input(s): "TSH", "T4TOTAL", "T3FREE", "THYROIDAB" in the last 72 hours.  Invalid input(s): "FREET3" Anemia work up No results for input(s): "VITAMINB12", "FOLATE", "FERRITIN", "TIBC", "IRON", "RETICCTPCT" in the last 72 hours. Microbiology Recent Results (from the past 240 hours)  Blood culture (routine x 2)     Status: None (Preliminary result)   Collection Time: 05/20/23 12:15 AM   Specimen: BLOOD  Result Value Ref Range Status   Specimen Description BLOOD RIGHT ANTECUBITAL  Final   Special Requests   Final    BOTTLES DRAWN AEROBIC AND ANAEROBIC Blood Culture results may not be optimal due to an inadequate volume of blood received in culture bottles   Culture   Final    NO GROWTH 1 DAY Performed at [facility], [address] [place], [place] [address]    Report Status PENDING  Incomplete  Urine Culture     Status: Abnormal (Preliminary result)   Collection Time: 05/20/23 11:32 AM   Specimen: Urine, Clean Catch  Result Value Ref Range Status   Specimen Description URINE, CLEAN CATCH  Final   Special Requests NONE  Final   Culture (A)  Final    >=100,000 COLONIES/mL ESCHERICHIA COLI SUSCEPTIBILITIES TO FOLLOW Performed at [facility], [address] [place], [place] [address]    Report Status PENDING  Incomplete  Blood culture (routine x 2)     Status: None (Preliminary result)   Collection Time: 05/21/23 12:20 AM   Specimen: BLOOD  Result Value Ref Range Status   Specimen Description BLOOD LEFT ANTECUBITAL  Final   Special Requests   Final    BOTTLES DRAWN AEROBIC AND ANAEROBIC Blood Culture results may not be optimal due to an inadequate volume of blood received in culture bottles   Culture   Final    NO  GROWTH 1 DAY Performed at [facility], [address] [place], [place] [address]    Report Status PENDING  Incomplete    Time coordinating discharge: 45 minutes  Signed: Demonta Wombles  [facility] 05/22/2023, 11:19 AM

## 2023-05-23 ENCOUNTER — Telehealth: Payer: Self-pay

## 2023-05-23 NOTE — Transitions of Care (Post Inpatient/ED Visit) (Signed)
 05/23/2023  Name: Vanessa Ward MRN: [id number] DOB: 1945-12-14  Today's TOC FU Call Status: Today's TOC FU Call Status:: Successful TOC FU Call Completed TOC FU Call Complete Date: 05/23/23 Patient's Name and Date of Birth confirmed.  Transition Care Management Follow-up Telephone Call Date of Discharge: 05/22/23 Discharge Facility: [facility]) Type of Discharge: Inpatient Admission Primary Inpatient Discharge Diagnosis:: Acute diverticulitis How have you been since you were released from the hospital?: Better Any questions or concerns?: No (reviewed diet and advancing liquids)  Items Reviewed: Did you receive and understand the discharge instructions provided?: Yes Medications obtained,verified, and reconciled?: Yes (Medications Reviewed) Any new allergies since your discharge?: No Dietary orders reviewed?: Yes Type of Diet Ordered:: Stay on full liquid diet for next 1 to 2 days and gradually advance as tolerated Do you have support at home?: Yes People in Home [RPT]: alone Name of Support/Comfort Primary Source: Daughter Archie Patten , 3 Grandchildren , Neighbors  Medications Reviewed Today: Medications Reviewed Today     Reviewed by Johnnette Barrios, RN (Registered Nurse) on 05/23/23 at 1446  Med List Status: <None>   Medication Order Taking? Sig Documenting Provider Last Dose Status Informant  albuterol (VENTOLIN HFA) 108 (90 Base) MCG/ACT inhaler [id number] Yes Inhale 2 puffs into the lungs every 6 (six) hours as needed for wheezing or shortness of breath. Martina Sinner, MD Taking Active Self  aspirin EC 81 MG tablet [id number] Yes Take 81 mg by mouth daily. [provider] Taking Active Self  cefdinir (OMNICEF) 300 MG capsule [id number] Yes Take 1 capsule (300 mg total) by mouth 2 (two) times daily for 10 days. Lorin Glass, MD Taking Active   Cholecalciferol (DIALYVITE VITAMIN D 5000 PO) [id number] Yes Take 5,000 Units by mouth daily. [provider]  Taking Active Self  EQ LORATADINE 10 MG tablet [id number] Yes TAKE 1 TABLET BY MOUTH ONCE DAILY AS NEEDED FOR ALLERGIES Dorothyann Peng, MD Taking Active Self  lisinopril-hydrochlorothiazide (ZESTORETIC) 20-25 MG tablet [id number] Yes Take 1 tablet by mouth once daily Dorothyann Peng, MD Taking Active Self  metroNIDAZOLE (FLAGYL) 500 MG tablet [id number] Yes Take 1 tablet (500 mg total) by mouth 2 (two) times daily for 10 days. Lorin Glass, MD Taking Active   omeprazole (PRILOSEC) 40 MG capsule [id number] Yes Take 1 capsule by mouth once daily Dorothyann Peng, MD Taking Active Self  rosuvastatin (CRESTOR) 10 MG tablet [id number] Yes Take 1 tablet (10 mg total) by mouth daily. Dorothyann Peng, MD Taking Active Self  Tiotropium Bromide-Olodaterol (STIOLTO RESPIMAT) 2.5-2.5 MCG/ACT AERS [id number] No Inhale 2 puffs into the lungs daily.  Patient not taking: Reported on 05/23/2023   Martina Sinner, MD Not Taking Active Self           Med Note Sharon Seller, Halen Antenucci L   Mon May 23, 2023  2:46 PM) Has Pulmonologist follow-up 5/16 Did not renew this yet          Medication reconciliation / review completed based on most recent discharge summary and EHR medication list. Confirmed patient is taking all newly prescribed medications as instructed (any discrepancies are noted in review section)   Patient  is aware of any changes to and / or  any dosage adjustments to medication regimen. Patient denies questions at this time and reports no barriers to medication adherence.   Updates reviewed with patient  START taking: cefdinir (OMNICEF) metroNIDAZOLE (Flagyl) Recommendations at discharge:  Complete the course of antibiotics with 10 more days of oral Omnicef  and oral Flagyl  Stay on full liquid diet for next 1 to 2 days and gradually advance as tolerated Activity: As tolerated with Full fall precautions use walker/cane & assistance as needed  Home Care and Equipment/Supplies: Were Home Health Services Ordered?:  No Any new equipment or medical supplies ordered?: No  Functional Questionnaire: Do you need assistance with bathing/showering or dressing?: No Do you need assistance with meal preparation?: No Do you need assistance with eating?: No Do you have difficulty maintaining continence: No Do you need assistance with getting out of bed/getting out of a chair/moving?: Yes (Full fall precautions use walker/cane & assistance as needed) Do you have difficulty managing or taking your medications?: No  Follow up appointments reviewed: PCP Follow-up appointment confirmed?: Yes Date of PCP follow-up appointment?: 05/25/23 Follow-up Provider: Dorothyann Peng, MD Specialist Hospital Follow-up appointment confirmed?: Yes Date of Specialist follow-up appointment?: 07/01/23 Follow-Up Specialty Provider:: Martina Sinner  Friday Jul 01, 2023 9:30 AM  [facility] Follow up with Charna Elizabeth Specialty: Gastroenterology [address] [place] [address] [phone] Will schedule after she sees PCP   Do you need transportation to your follow-up appointment?: No Do you understand care options if your condition(s) worsen?: Yes-patient verbalized understanding  SDOH Interventions Today    Flowsheet Row Most Recent Value  SDOH Interventions   Food Insecurity Interventions Intervention Not Indicated  Housing Interventions Intervention Not Indicated  Transportation Interventions Intervention Not Indicated, Patient Resources (Friends/Family), Payor Benefit  Utilities Interventions Intervention Not Indicated        Benefits reviewed  Based on current information and Insurance plan -Reviewed benefits accessible to patient, including details about eligibility options for care and  available value based care options  if any areas of needs were identified.  Reviewed patient/  caregiver's ability to access and / or  ability with navigating the benefits  system..Amb Referral made if indicted , refer to orders section of note for details   Reviewed goals for care  Patient / Caregiver was encouraged to make informed decisions about their care, actively participate in managing their health condition, and implement lifestyle changes as needed to promote independence and self-management of health care. There were no reported  barriers to care.   TOC program  Patient is at  risk for readmission and / or has history of  high utilization  Discussed VBCI  TOC program and weekly calls to patient to assess condition/status, medication management  and provide support/education as indicated . Patient  and / or Caregive voiced understanding and declined enrollment in the 30-day TOC Program at this time . She feels she is doing well and has strong support at home . She [place] well versed on her medications and is aware of how to advance diet and plans to advance slowly    Follow-up Plan Scheduled a post hospital Follow up appointment  with your  PCP within in 1-2 weeks of your hospital discharge date - Scheduled 05/25/23  Take all your medications and your discharge paperwork with you for your next visit with your Primary MD-   Be sure to request your Primary MD to go over all hospital tests and procedure/radiological results at the follow up appointment ,  2.  Follow up with Charna Elizabeth Specialty: Gastroenterology [address], [place] [address] [phone] Will schedule after she sees PCP   3. Martina Sinner Friday Jul 01, 2023 9:30 AM (Arrive by  9:15 AM) [facility] at [facility] [address] [place] [address] [place] [address] [phone]  4.  Patient was encouraged to Contact PCP with any questions or concerns regarding ongoing medical care, any difficulty obtaining or picking up prescriptions, any changes or worsening in condition including signs / symptoms not relieved  with interventions Patient had no  additional questions or concerns at this time.    The patient has been provided with contact information for the care management team and has been advised to call with any health-related questions or concerns. Follow up with their  PCP, Specialists or  additional Healthcare Providers as scheduled,  sooner if concerns arise    Susa Loffler , BSN, RN [facility]   VBCI-Population Health RN Care Manager Direct Dial [phone]  Fax: [phone] Website: [email or web address]

## 2023-05-23 NOTE — Transitions of Care (Post Inpatient/ED Visit) (Signed)
   05/23/2023  Name: Vanessa Ward MRN: [id number] DOB: 1945-10-06  Today's TOC FU Call Status: Today's TOC FU Call Status:: Successful TOC FU Call Completed TOC FU Call Complete Date: 05/23/23 Patient's Name and Date of Birth confirmed.  Transition Care Management Follow-up Telephone Call Date of Discharge: 05/22/23 Discharge Facility: [facility]) Type of Discharge: Inpatient Admission Primary Inpatient Discharge Diagnosis:: acute diverticulitis How have you been since you were released from the hospital?: Same  Items Reviewed: Did you receive and understand the discharge instructions provided?: Yes Medications obtained,verified, and reconciled?: Yes (Medications Reviewed) Any new allergies since your discharge?: No Dietary orders reviewed?: Yes Type of Diet Ordered:: liquid diet Do you have support at home?: Yes People in Home [RPT]: child(ren), adult Name of Support/Comfort Primary Source: [name]  Medications Reviewed Today: Medications Reviewed Today   Medications were not reviewed in this encounter     Home Care and Equipment/Supplies: Were Home Health Services Ordered?: NA Any new equipment or medical supplies ordered?: NA  Functional Questionnaire: Do you need assistance with bathing/showering or dressing?: No Do you need assistance with meal preparation?: No Do you need assistance with eating?: No Do you have difficulty maintaining continence: No Do you need assistance with getting out of bed/getting out of a chair/moving?: No Do you have difficulty managing or taking your medications?: No  Follow up appointments reviewed: PCP Follow-up appointment confirmed?: Yes Date of PCP follow-up appointment?: 05/25/23 Follow-up Provider: Dorothyann Peng MD [facility] Follow-up appointment confirmed?: NA Do you need transportation to your follow-up appointment?: No Do you understand care options if your condition(s) worsen?: Yes-patient verbalized  understanding    SIGNATUREYL,RMA

## 2023-05-25 ENCOUNTER — Ambulatory Visit (INDEPENDENT_AMBULATORY_CARE_PROVIDER_SITE_OTHER): Admitting: Internal Medicine

## 2023-05-25 ENCOUNTER — Encounter: Payer: Self-pay | Admitting: Internal Medicine

## 2023-05-25 ENCOUNTER — Inpatient Hospital Stay: Admitting: Internal Medicine

## 2023-05-25 VITALS — BP 118/82 | HR 89 | Temp 98.4°F | Ht 64.0 in | Wt 230.4 lb

## 2023-05-25 DIAGNOSIS — N1831 Chronic kidney disease, stage 3a: Secondary | ICD-10-CM | POA: Diagnosis not present

## 2023-05-25 DIAGNOSIS — K5792 Diverticulitis of intestine, part unspecified, without perforation or abscess without bleeding: Secondary | ICD-10-CM | POA: Diagnosis not present

## 2023-05-25 DIAGNOSIS — D1771 Benign lipomatous neoplasm of kidney: Secondary | ICD-10-CM | POA: Diagnosis not present

## 2023-05-25 DIAGNOSIS — I1310 Hypertensive heart and chronic kidney disease without heart failure, with stage 1 through stage 4 chronic kidney disease, or unspecified chronic kidney disease: Secondary | ICD-10-CM

## 2023-05-25 DIAGNOSIS — I700 Atherosclerosis of aorta: Secondary | ICD-10-CM

## 2023-05-25 DIAGNOSIS — E7800 Pure hypercholesterolemia, unspecified: Secondary | ICD-10-CM

## 2023-05-25 NOTE — Patient Instructions (Signed)
 Diverticulitis  Diverticulitis is when small pouches in your colon get infected or swollen. This causes pain in your belly (abdomen) and watery poop (diarrhea). The small pouches are called diverticula. They may form if you have a condition called diverticulosis. What are the causes? You may get this condition if poop (stool) gets trapped in the pouches in your colon. The poop lets germs (bacteria) grow. This causes an infection. What increases the risk? You are more likely to get this condition if you have small pouches in your colon. You are also more likely to get it if: You are overweight or very overweight (obese). You do not exercise enough. You drink alcohol. You smoke. You eat a lot of red meat, like beef, pork, or lamb. You do not eat enough fiber. You are older than 77 years of age. What are the signs or symptoms? Pain in your belly. Pain is often on the left side, but it may be felt in other spots too. Fever and chills. Feeling like you may vomit. Vomiting. Having cramps. Feeling full. Changes in how often you poop. Blood in your poop. How is this treated? Most cases are treated at home. You may be told to: Take over-the-counter pain medicines. Only eat and drink clear liquids. Take antibiotics. Rest. Very bad cases may need to be treated at a hospital. Treatment may include: Not eating or drinking. Taking pain medicines. Getting antibiotics through an IV tube. Getting fluid and food through an IV tube. Having surgery. When you are feeling better, you may need to have a test to look at your colon (colonoscopy). Follow these instructions at home: Medicines Take over-the-counter and prescription medicines only as told by your doctor. These include: Fiber pills. Probiotics. Medicines to make your poop soft (stool softeners). If you were prescribed antibiotics, take them as told by your doctor. Do not stop taking them even if you start to feel better. Ask your  doctor if you should avoid driving or using machines while you are taking your medicine. Eating and drinking  Follow the diet told by your doctor. You may need to only eat and drink liquids. When you feel better, you may be able to eat more foods. You may also be told to eat a lot of fiber. Fiber helps you poop. Foods with fiber include berries, beans, lentils, and green vegetables. Try not to eat red meat. General instructions Do not smoke or use any products that contain nicotine or tobacco. If you need help quitting, ask your doctor. Exercise 3 or more times a week. Try to go for 30 minutes each time. Exercise enough to sweat and make your heart beat faster. Contact a doctor if: Your pain gets worse. You are not pooping like normal. Your symptoms do not get better. Your symptoms get worse very fast. You have a fever. You vomit more than one time. You have poop that is: Bloody. Black. Tarry. This information is not intended to replace advice given to you by your health care provider. Make sure you discuss any questions you have with your health care provider. Document Revised: 10/29/2021 Document Reviewed: 10/29/2021 Elsevier Patient Education  2024 [employer].

## 2023-05-25 NOTE — Assessment & Plan Note (Signed)
 Chronic, stable. She is already followed by Urology. She is now under observation.  She is scheduled for every other year imaging.

## 2023-05-25 NOTE — Progress Notes (Signed)
 I,Victoria T Basil Lim, CMA,acting as a Neurosurgeon for Smiley Dung, MD.,have documented all relevant documentation on the behalf of Smiley Dung, MD,as directed by  Smiley Dung, MD while in the presence of Smiley Dung, MD.  Subjective:  Patient ID: Vanessa Ward , female    DOB: 12/08/1945 , 78 y.o.   MRN: [id number]  Chief Complaint  Patient presents with   Hospitalization Follow-up    Patient presents today for hospital follow up. She went to [facility] on 4/4 for Diverticulitis & discharged on 4/6. Today she reports feeling : woozy". She is accompanied by her daughter today.  She has no specific questions or concerns.     HPI  Discussed the use of AI scribe software for clinical note transcription with the patient, who gave verbal consent to proceed.  History of Present Illness Vanessa Ward is a 78 year old female who presents for a hospital follow-up after being treated for diverticulitis. She is accompanied by her daughter, Lynnie Saucier.  She initially sought care at urgent care for abdominal pain and was referred to the emergency department after an x-ray revealed a possible mass. A CT scan at the hospital suggested diverticulitis, leading to her admission from April 4th to April 6th. She was treated with antibiotics, which she continues to take for a total of ten days.  Since discharge, she has been on a liquid diet and has recently started consuming solid foods like potato soup and baked potatoes. She reports no current abdominal pain but notes a significant change in her bowel movements, describing them as watery, which she attributes to the antibiotics. She has not experienced similar abdominal pain in the past.  Her daughter, Lynnie Saucier, encouraged her to seek medical attention after the pain began the day after a funeral. She has a high tolerance for pain and initially resisted going to the hospital. There is a family history of cancer, which has heightened her concern about  her symptoms.   Past Medical History:  Diagnosis Date   Arthritis    Chronic kidney disease    resent diagnosis of hematuria due to benigh kidney tumor   GERD (gastroesophageal reflux disease)    Hypertension    Shingles      Family History  Problem Relation Age of Onset   Hypertension Mother    Heart disease Mother    Hypertension Father    Diabetes Paternal Grandmother      Current Outpatient Medications:    albuterol (VENTOLIN HFA) 108 (90 Base) MCG/ACT inhaler, Inhale 2 puffs into the lungs every 6 (six) hours as needed for wheezing or shortness of breath., Disp: 8 g, Rfl: 6   aspirin EC 81 MG tablet, Take 81 mg by mouth daily., Disp: , Rfl:    cefdinir (OMNICEF) 300 MG capsule, Take 1 capsule (300 mg total) by mouth 2 (two) times daily for 10 days., Disp: 20 capsule, Rfl: 0   Cholecalciferol (DIALYVITE VITAMIN D 5000 PO), Take 5,000 Units by mouth daily., Disp: , Rfl:    EQ LORATADINE 10 MG tablet, TAKE 1 TABLET BY MOUTH ONCE DAILY AS NEEDED FOR ALLERGIES, Disp: 30 tablet, Rfl: 0   lisinopril-hydrochlorothiazide (ZESTORETIC) 20-25 MG tablet, Take 1 tablet by mouth once daily, Disp: 90 tablet, Rfl: 0   metroNIDAZOLE (FLAGYL) 500 MG tablet, Take 1 tablet (500 mg total) by mouth 2 (two) times daily for 10 days., Disp: 20 tablet, Rfl: 0   omeprazole (PRILOSEC) 40 MG capsule, Take 1  capsule by mouth once daily, Disp: 90 capsule, Rfl: 2   rosuvastatin (CRESTOR) 10 MG tablet, Take 1 tablet (10 mg total) by mouth daily., Disp: 90 tablet, Rfl: 2   Tiotropium Bromide-Olodaterol (STIOLTO RESPIMAT) 2.5-2.5 MCG/ACT AERS, Inhale 2 puffs into the lungs daily. (Patient not taking: Reported on 05/23/2023), Disp: 4 g, Rfl: 11   Allergies  Allergen Reactions   Other     Perfume, scented lotion - flares up eczema     Review of Systems  Constitutional: Negative.   Respiratory: Negative.    Cardiovascular: Negative.   Gastrointestinal:        POS loose stools  Neurological: Negative.    Psychiatric/Behavioral: Negative.       Today's Vitals   05/25/23 1151  BP: 118/82  Pulse: 89  Temp: 98.4 F (36.9 C)  SpO2: 98%  Weight: 230 lb 6.4 oz (104.5 kg)  Height: 5\' 4"  (1.626 m)   Body mass index is 39.55 kg/m.  Wt Readings from Last 3 Encounters:  05/25/23 230 lb 6.4 oz (104.5 kg)  05/21/23 231 lb 7.7 oz (105 kg)  05/20/23 225 lb (102.1 kg)     Objective:  Physical Exam Vitals and nursing note reviewed.  Constitutional:      Appearance: Normal appearance. She is obese.  HENT:     Head: Normocephalic and atraumatic.  Eyes:     Extraocular Movements: Extraocular movements intact.  Cardiovascular:     Rate and Rhythm: Normal rate and regular rhythm.     Heart sounds: Normal heart sounds.  Pulmonary:     Effort: Pulmonary effort is normal.     Breath sounds: Normal breath sounds.  Abdominal:     General: Bowel sounds are normal.     Palpations: Abdomen is soft.     Tenderness: There is no abdominal tenderness.  Musculoskeletal:     Cervical back: Normal range of motion.  Skin:    General: Skin is warm.  Neurological:     General: No focal deficit present.     Mental Status: She is alert.  Psychiatric:        Mood and Affect: Mood normal.        Behavior: Behavior normal.         Assessment And Plan:  Acute diverticulitis Assessment & Plan: TCM PERFORMED. A MEMBER OF THE CLINICAL TEAM SPOKE WITH THE PATIENT UPON DISCHARGE. DISCHARGE SUMMARY WAS REVIEWED IN FULL DETAIL DURING THE VISIT. MEDS RECONCILED AND COMPARED TO DISCHARGE MEDS. MEDICATION LIST WAS UPDATED AND REVIEWED WITH THE PATIENT. GREATER THAN 50% FACE TO FACE TIME WAS SPENT IN COUNSELING AND COORDINATION OF CARE. ALL QUESTIONS WERE ANSWERED TO THE SATISFACTION OF THE PATIENT.  She is encouraged to complete full abx course. She is encouraged to let me know if her sx return. I will also refer her to GI for f/u.   Orders: -     Ambulatory referral to Gastroenterology  Hypertensive heart  and renal disease with renal failure, stage 1 through stage 4 or unspecified chronic kidney disease, without heart failure Assessment & Plan: Chronic, well controlled.  She will continue with lisinopril 20/25mg  daily. She is encouraged to follow low sodium diet. She will f/u in four to six months for re-evaluation.    Atherosclerosis of aorta (HCC) Assessment & Plan: Chronic, LDL goal is less than 70.  She is encouraged to follow heart healthy lifestyle. Clean eating, regular exercise and stress management are all a part of this lifestyle. She is  also encouraged to continue with rosuvastatin 10mg  daily.     Stage 3a chronic kidney disease (HCC) Assessment & Plan: Chronic, reminded to avoid NSAIDs, stay well hydrated and to keep BP well controlled to decrease risk of CKD progression.     Pure hypercholesterolemia Assessment & Plan: Chronic, currently on rosuvastatin. Encouraged to follow heart healthy lifestyle.    Angiomyolipoma of left kidney Assessment & Plan: Chronic, stable. She is already followed by Urology. She is now under observation.  She is scheduled for every other year imaging.     Return if symptoms worsen or fail to improve.  Patient was given opportunity to ask questions. Patient verbalized understanding of the plan and was able to repeat key elements of the plan. All questions were answered to their satisfaction.    I, Smiley Dung, MD, have reviewed all documentation for this visit. The documentation on 05/25/23 for the exam, diagnosis, procedures, and orders are all accurate and complete.   IF YOU HAVE BEEN REFERRED TO A SPECIALIST, IT MAY TAKE 1-2 WEEKS TO SCHEDULE/PROCESS THE REFERRAL. IF YOU HAVE NOT HEARD FROM US /SPECIALIST IN TWO WEEKS, PLEASE GIVE US  A CALL AT [phone].   THE PATIENT IS ENCOURAGED TO PRACTICE SOCIAL DISTANCING DUE TO THE COVID-19 PANDEMIC.

## 2023-05-25 NOTE — Assessment & Plan Note (Signed)
 Chronic, LDL goal is less than 70.  She is encouraged to follow heart healthy lifestyle. Clean eating, regular exercise and stress management are all a part of this lifestyle. She is also encouraged to continue with rosuvastatin 10mg  daily.

## 2023-05-25 NOTE — Assessment & Plan Note (Addendum)
 TCM PERFORMED. A MEMBER OF THE CLINICAL TEAM SPOKE WITH THE PATIENT UPON DISCHARGE. DISCHARGE SUMMARY WAS REVIEWED IN FULL DETAIL DURING THE VISIT. MEDS RECONCILED AND COMPARED TO DISCHARGE MEDS. MEDICATION LIST WAS UPDATED AND REVIEWED WITH THE PATIENT. GREATER THAN 50% FACE TO FACE TIME WAS SPENT IN COUNSELING AND COORDINATION OF CARE. ALL QUESTIONS WERE ANSWERED TO THE SATISFACTION OF THE PATIENT.  She is encouraged to complete full abx course. She is encouraged to let me know if her sx return. I will also refer her to GI for f/u.

## 2023-05-26 LAB — CULTURE, BLOOD (ROUTINE X 2)
Culture: NO GROWTH
Culture: NO GROWTH

## 2023-05-31 NOTE — Assessment & Plan Note (Signed)
 Chronic, currently on rosuvastatin. Encouraged to follow heart healthy lifestyle.

## 2023-05-31 NOTE — Assessment & Plan Note (Signed)
 Chronic, well controlled.  She will continue with lisinopril 20/25mg  daily. She is encouraged to follow low sodium diet. She will f/u in four to six months for re-evaluation.

## 2023-05-31 NOTE — Assessment & Plan Note (Signed)
 Chronic, reminded to avoid NSAIDs, stay well hydrated and to keep BP well controlled to decrease risk of CKD progression.

## 2023-06-02 DIAGNOSIS — Z1211 Encounter for screening for malignant neoplasm of colon: Secondary | ICD-10-CM | POA: Diagnosis not present

## 2023-06-02 DIAGNOSIS — K5730 Diverticulosis of large intestine without perforation or abscess without bleeding: Secondary | ICD-10-CM | POA: Diagnosis not present

## 2023-06-02 DIAGNOSIS — K219 Gastro-esophageal reflux disease without esophagitis: Secondary | ICD-10-CM | POA: Diagnosis not present

## 2023-06-02 DIAGNOSIS — K449 Diaphragmatic hernia without obstruction or gangrene: Secondary | ICD-10-CM | POA: Diagnosis not present

## 2023-06-02 DIAGNOSIS — K8020 Calculus of gallbladder without cholecystitis without obstruction: Secondary | ICD-10-CM | POA: Diagnosis not present

## 2023-06-02 DIAGNOSIS — D638 Anemia in other chronic diseases classified elsewhere: Secondary | ICD-10-CM | POA: Diagnosis not present

## 2023-06-27 ENCOUNTER — Other Ambulatory Visit: Payer: Self-pay | Admitting: Internal Medicine

## 2023-07-01 ENCOUNTER — Ambulatory Visit: Admitting: Pulmonary Disease

## 2023-07-01 ENCOUNTER — Encounter: Payer: Self-pay | Admitting: Pulmonary Disease

## 2023-07-01 VITALS — BP 120/76 | HR 77 | Ht 64.0 in | Wt 223.0 lb

## 2023-07-01 DIAGNOSIS — J432 Centrilobular emphysema: Secondary | ICD-10-CM

## 2023-07-01 MED ORDER — ALBUTEROL SULFATE HFA 108 (90 BASE) MCG/ACT IN AERS: 2.0000 | INHALATION_SPRAY | RESPIRATORY_TRACT | 6 refills | Status: AC | PRN

## 2023-07-01 NOTE — Progress Notes (Signed)
 Synopsis: Referred in November 2023 for shortness of breath by Vanessa Curling, MD  Subjective:   PATIENT ID: Vanessa Ward GENDER: female DOB: 1945/05/24, MRN: [id number]  HPI  Chief Complaint  Patient presents with   Follow-up    Pt state stilto  INH has not helped at all    Vanessa Ward is a 78 year old woman, former smoker with GERD, hypertension and CKD who returns to pulmonary clinic for centrilobular emphysema.   She has been doing well since last visit. She has only been using albuterol  inhaler as needed and is using it about once daily. She did not note benefit from stiolto and could not justify the $200/month cost. She has been active keeping up with her great grandchildren as she watches them from time to time.  OV 09/29/22 She is taking stiolto intermittently with some effect. She continues to have exertional dyspnea on some days where she needs to take frequent breaks. Overall she remains very independent in her daily activities.    OV 03/03/22 She felt some improvement in her shortness of breath with trial of trelegy ellipta  1 puff but it irritated her throat.   PFTs today show moderate obstruction, no significant bronchodilator effect, air trapping and moderate diffusion defect.  Intial OV 12/30/21 She reports developing shortness of breath over recent months. The dyspnea is with exertion. She denies wheezing or cough. She denies night time awakenings due to shortness of breath. She does report snoring. She does feel rested a majority of the time after a full night of sleep. Denies daytime sleepiness. She does not take naps.   She has been evaluated by cardiology with normal echo. Stress test did not show ischemic changes but she has poor exercise capacity and blood pressure was 198/85 during exercise.  She quit smoking 10 years ago. She smoked for 30 years, less than a pack per day. She is enrolled in lung cancer screening through her PCP. 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 She lives alone and has children that live in the area.   Past Medical History:  Diagnosis Date   Arthritis    Chronic kidney disease    resent diagnosis of hematuria due to benigh kidney tumor   GERD (gastroesophageal reflux disease)    Hypertension    Shingles      Family History  Problem Relation Age of Onset   Hypertension Mother    Heart disease Mother    Hypertension Father    Diabetes Paternal Grandmother      Social History   Socioeconomic History   Marital status: Single    Spouse name: Not on file   Number of children: Not on file   Years of education: Not on file   Highest education level: Not on file  Occupational History   Not on file  Tobacco Use   Smoking status: Former    Current packs/day: 0.00    Average packs/day: 0.5 packs/day for 49.0 years (24.5 ttl pk-yrs)    Types: Cigarettes, Pipe    Start date: 26    Quit date: 2016    Years since quitting: 9.3   Smokeless tobacco: Never   Tobacco comments:    Quit 8 years ago,   Vaping Use   Vaping status: Never Used  Substance and Sexual Activity   Alcohol use: Yes    Alcohol/week: 2.0 standard drinks of alcohol  Types: 2 Standard drinks or equivalent per week   Drug use: No   Sexual activity: Not Currently    Birth control/protection: Post-menopausal  Other Topics Concern   Not on file  Social History Narrative   Not on file   Social Drivers of Health   Financial Resource Strain: Low Risk  (05/11/2023)   Overall Financial Resource Strain (CARDIA)    Difficulty of Paying Living Expenses: Not hard at all  Food Insecurity: No Food Insecurity (05/23/2023)   Hunger Vital Sign    Worried About Running Out of Food in the Last Year: Never true    Ran Out of Food in the Last Year: Never true  Transportation Needs: No Transportation Needs (05/23/2023)   PRAPARE - Scientist, research (physical sciences) (Medical): No    Lack of Transportation (Non-Medical): No  Physical Activity: Inactive (05/11/2023)   Exercise Vital Sign    Days of Exercise per Week: 0 days    Minutes of Exercise per Session: 0 min  Stress: No Stress Concern Present (05/11/2023)   [name]    Feeling of Stress : Not at all  Social Connections: Moderately Isolated (05/21/2023)   Social Connection and Isolation Panel [NHANES]    Frequency of Communication with Friends and Family: Three times a week    Frequency of Social Gatherings with Friends and Family: More than three times a week    Attends Religious Services: 1 to 4 times per year    Active Member of [employer] or Organizations: No    Attends Banker Meetings: Never    Marital Status: Never married  Intimate Partner Violence: Not At Risk (05/23/2023)   Humiliation, Afraid, Rape, and Kick questionnaire    Fear of Current or Ex-Partner: No    Emotionally Abused: No    Physically Abused: No    Sexually Abused: No     Allergies  Allergen Reactions   Other     Perfume, scented lotion - flares up eczema     Outpatient Medications Prior to Visit  Medication Sig Dispense Refill   aspirin  EC 81 MG tablet Take 81 mg by mouth daily.     Cholecalciferol (DIALYVITE VITAMIN D  5000 PO) Take 5,000 Units by mouth daily.     EQ LORATADINE  10 MG tablet TAKE 1 TABLET BY MOUTH ONCE DAILY AS NEEDED FOR ALLERGIES 30 tablet 0   lisinopril -hydrochlorothiazide  (ZESTORETIC ) 20-25 MG tablet Take 1 tablet by mouth once daily 90 tablet 0   omeprazole  (PRILOSEC) 40 MG capsule Take 1 capsule by mouth once daily 90 capsule 2   rosuvastatin  (CRESTOR ) 10 MG tablet Take 1 tablet (10 mg total) by mouth daily. 90 tablet 2   albuterol  (VENTOLIN  HFA) 108 (90 Base) MCG/ACT inhaler Inhale 2 puffs into the lungs every 6 (six) hours as needed for wheezing or shortness of breath. 8 g 6   Tiotropium  Bromide-Olodaterol (STIOLTO RESPIMAT ) 2.5-2.5 MCG/ACT AERS Inhale 2 puffs into the lungs daily. (Patient not taking: Reported on 07/01/2023) 4 g 11   No facility-administered medications prior to visit.    Review of Systems  Constitutional:  Negative for chills, fever, malaise/fatigue and weight loss.  HENT:  Negative for congestion, sinus pain and sore throat.   Eyes: Negative.   Respiratory:  Positive for shortness of breath. Negative for cough, hemoptysis, sputum production and wheezing.   Cardiovascular:  Negative for chest pain, palpitations, orthopnea, claudication and leg swelling.  Gastrointestinal:  Negative for abdominal pain, heartburn, nausea and vomiting.  Genitourinary: Negative.   Musculoskeletal:  Negative for joint pain and myalgias.  Skin:  Negative for rash.  Neurological:  Negative for weakness.  Endo/Heme/Allergies: Negative.   Psychiatric/Behavioral: Negative.     Objective:   Vitals:   07/01/23 0921  BP: 120/76  Pulse: 77  SpO2: 97%  Weight: 223 lb (101.2 kg)  Height: 5\' 4"  (1.626 m)    Physical Exam Constitutional:      General: She is not in acute distress.    Appearance: She is not ill-appearing.  HENT:     Head: Normocephalic and atraumatic.  Eyes:     General: No scleral icterus.    Conjunctiva/sclera: Conjunctivae normal.  Cardiovascular:     Rate and Rhythm: Normal rate and regular rhythm.     Pulses: Normal pulses.     Heart sounds: Normal heart sounds. No murmur heard. Pulmonary:     Effort: Pulmonary effort is normal.     Breath sounds: Normal breath sounds. No wheezing, rhonchi or rales.  Musculoskeletal:     Right lower leg: No edema.     Left lower leg: No edema.  Neurological:     Mental Status: She is alert.    CBC    Component Value Date/Time   WBC 8.5 05/22/2023 0418   RBC 3.69 (L) 05/22/2023 0418   HGB 9.4 (L) 05/22/2023 0418   HGB 10.9 (L) 05/11/2023 1634   HCT 29.7 (L) 05/22/2023 0418   HCT 35.8 05/11/2023 1634    PLT 263 05/22/2023 0418   PLT 306 05/11/2023 1634   MCV 80.5 05/22/2023 0418   MCV 83 05/11/2023 1634   MCH 25.5 (L) 05/22/2023 0418   MCHC 31.6 05/22/2023 0418   RDW 15.0 05/22/2023 0418   RDW 13.7 05/11/2023 1634   LYMPHSABS 3.9 05/22/2023 0418   MONOABS 0.6 05/22/2023 0418   EOSABS 0.4 05/22/2023 0418   BASOSABS 0.0 05/22/2023 0418      Latest Ref Rng & Units 05/22/2023    4:18 AM 05/21/2023    4:47 AM 05/20/2023    5:44 PM  BMP  Glucose 70 - 99 mg/dL 88  93  161   BUN 8 - 23 mg/dL 22  27  33   Creatinine 0.44 - 1.00 mg/dL 0.96  0.45  4.09   Sodium 135 - 145 mmol/L 139  138  139   Potassium 3.5 - 5.1 mmol/L 3.6  3.6  3.6   Chloride 98 - 111 mmol/L 107  105  108   CO2 22 - 32 mmol/L 23  23  23    Calcium  8.9 - 10.3 mg/dL 8.7  8.5  8.9    Chest imaging: LCS CT Chest 08/30/22 1. Lung-RADS 1, negative. Continue annual screening with low-dose chest CT without contrast in 12 months. 2. Cholelithiasis. 3. Aortic Atherosclerosis (ICD10-I70.0) and Emphysema (ICD10-J43.9).  LCS CT Chest 08/26/21 1. Lung-RADS 1, negative. Continue annual screening with low-dose chest CT without contrast in 12 months. 2. Aortic atherosclerosis. 3. Mild diffuse bronchial wall thickening with very mild centrilobular and paraseptal emphysema; imaging findings suggestive of underlying COPD. 4. Cholelithiasis.  PFT:    Latest Ref Rng & Units 03/03/2022    9:54 AM  PFT Results  FVC-Pre L 1.74   FVC-Predicted Pre % 63   FVC-Post L 1.87   FVC-Predicted Post % 67   Pre FEV1/FVC % % 67   Post FEV1/FCV % % 68   FEV1-Pre  L 1.17   FEV1-Predicted Pre % 56   FEV1-Post L 1.28   DLCO uncorrected ml/min/mmHg 10.18   DLCO UNC% % 54   DLCO corrected ml/min/mmHg 10.18   DLCO COR %Predicted % 54   DLVA Predicted % 77   TLC L 5.48   TLC % Predicted % 110   RV % Predicted % 170   PFT 2024: moderate obstruction, no significant bronchodilator effect, air trapping and moderate diffusion  defect.  Labs:  Path:  Echo 08/21/20:  1. Left ventricular ejection fraction, by estimation, is 60 to 65%. The  left ventricle has normal function. The left ventricle has no regional  wall motion abnormalities. Left ventricular diastolic parameters were  normal. The average left ventricular  global longitudinal strain is -22.9 %. The global longitudinal strain is  normal.   2. Right ventricular systolic function is normal. The right ventricular  size is normal. Tricuspid regurgitation signal is inadequate for assessing  PA pressure.   3. The mitral valve is grossly normal. No evidence of mitral valve  regurgitation. No evidence of mitral stenosis.   4. The aortic valve is tricuspid. Aortic valve regurgitation is not  visualized. No aortic stenosis is present.   5. The inferior vena cava is normal in size with greater than 50%  respiratory variability, suggesting right atrial pressure of 3 mmHg.  Heart Catheterization:  Assessment & Plan:   Centrilobular emphysema (HCC) - Plan: albuterol  (VENTOLIN  HFA) 108 (90 Base) MCG/ACT inhaler  Discussion: Vanessa Ward is a 78 year old woman, former smoker with GERD, hypertension and CKD who returns to pulmonary clinic for COPD.   She has centrilobular emphysema based on CT Chest scan and moderate obstruction, no significant bronchodilator effect, air trapping and moderate diffusion defect based on PFTs.   She has not noted benefit from maintenance inhalers (stiolto). She can use albuterol  inhaler as needed, 1-2 puffs every 4-6 hours as needed  Follow up in 1 year.  Duaine German, MD [facility] Office: [phone]   Current Outpatient Medications:    aspirin  EC 81 MG tablet, Take 81 mg by mouth daily., Disp: , Rfl:    Cholecalciferol (DIALYVITE VITAMIN D  5000 PO), Take 5,000 Units by mouth daily., Disp: , Rfl:    EQ LORATADINE  10 MG tablet, TAKE 1 TABLET BY MOUTH ONCE DAILY AS NEEDED FOR ALLERGIES, Disp: 30  tablet, Rfl: 0   lisinopril -hydrochlorothiazide  (ZESTORETIC ) 20-25 MG tablet, Take 1 tablet by mouth once daily, Disp: 90 tablet, Rfl: 0   omeprazole  (PRILOSEC) 40 MG capsule, Take 1 capsule by mouth once daily, Disp: 90 capsule, Rfl: 2   rosuvastatin  (CRESTOR ) 10 MG tablet, Take 1 tablet (10 mg total) by mouth daily., Disp: 90 tablet, Rfl: 2   albuterol  (VENTOLIN  HFA) 108 (90 Base) MCG/ACT inhaler, Inhale 2 puffs into the lungs every 4 (four) hours as needed for wheezing or shortness of breath., Disp: 8 g, Rfl: 6

## 2023-07-01 NOTE — Patient Instructions (Signed)
 Continue albuterol  inhaler 1-2 puffs every 4-6 hours as needed  Follow up in 1 year, call sooner if needed

## 2023-07-22 DIAGNOSIS — K635 Polyp of colon: Secondary | ICD-10-CM | POA: Diagnosis not present

## 2023-07-22 DIAGNOSIS — K5730 Diverticulosis of large intestine without perforation or abscess without bleeding: Secondary | ICD-10-CM | POA: Diagnosis not present

## 2023-07-22 DIAGNOSIS — Z1211 Encounter for screening for malignant neoplasm of colon: Secondary | ICD-10-CM | POA: Diagnosis not present

## 2023-07-22 DIAGNOSIS — R933 Abnormal findings on diagnostic imaging of other parts of digestive tract: Secondary | ICD-10-CM | POA: Diagnosis not present

## 2023-07-22 LAB — HM COLONOSCOPY

## 2023-07-27 DIAGNOSIS — H2513 Age-related nuclear cataract, bilateral: Secondary | ICD-10-CM | POA: Diagnosis not present

## 2023-08-05 ENCOUNTER — Other Ambulatory Visit: Payer: Self-pay | Admitting: Gastroenterology

## 2023-08-29 ENCOUNTER — Other Ambulatory Visit: Payer: Self-pay | Admitting: Internal Medicine

## 2023-08-31 ENCOUNTER — Ambulatory Visit
Admission: RE | Admit: 2023-08-31 | Discharge: 2023-08-31 | Disposition: A | Discharge status: Home / Self Care | Source: Ambulatory Visit | Attending: Internal Medicine | Admitting: Internal Medicine

## 2023-08-31 DIAGNOSIS — Z87891 Personal history of nicotine dependence: Secondary | ICD-10-CM | POA: Diagnosis not present

## 2023-08-31 DIAGNOSIS — F17211 Nicotine dependence, cigarettes, in remission: Secondary | ICD-10-CM

## 2023-08-31 DIAGNOSIS — Z122 Encounter for screening for malignant neoplasm of respiratory organs: Secondary | ICD-10-CM | POA: Diagnosis not present

## 2023-09-02 ENCOUNTER — Encounter (HOSPITAL_COMMUNITY): Payer: Self-pay | Admitting: Gastroenterology

## 2023-09-02 NOTE — Progress Notes (Signed)
 Attempted to obtain medical history for pre op call via telephone, unable to reach at this time. HIPAA compliant voicemail message left requesting return call to pre surgical testing department.

## 2023-09-09 ENCOUNTER — Encounter (HOSPITAL_COMMUNITY): Payer: Self-pay | Admitting: Gastroenterology

## 2023-09-09 ENCOUNTER — Encounter (HOSPITAL_COMMUNITY): Admitting: Anesthesiology

## 2023-09-09 ENCOUNTER — Ambulatory Visit (HOSPITAL_COMMUNITY)
Admission: RE | Admit: 2023-09-09 | Discharge: 2023-09-09 | Disposition: A | Discharge status: Home / Self Care | Attending: Gastroenterology | Admitting: Gastroenterology

## 2023-09-09 ENCOUNTER — Other Ambulatory Visit: Payer: Self-pay

## 2023-09-09 ENCOUNTER — Ambulatory Visit (HOSPITAL_COMMUNITY): Admitting: Anesthesiology

## 2023-09-09 ENCOUNTER — Encounter (HOSPITAL_COMMUNITY)
Admission: RE | Disposition: A | Discharge status: Home / Self Care | Payer: Self-pay | Source: Home / Self Care | Attending: Gastroenterology

## 2023-09-09 DIAGNOSIS — I129 Hypertensive chronic kidney disease with stage 1 through stage 4 chronic kidney disease, or unspecified chronic kidney disease: Secondary | ICD-10-CM | POA: Diagnosis not present

## 2023-09-09 DIAGNOSIS — K5730 Diverticulosis of large intestine without perforation or abscess without bleeding: Secondary | ICD-10-CM | POA: Diagnosis not present

## 2023-09-09 DIAGNOSIS — D490 Neoplasm of unspecified behavior of digestive system: Secondary | ICD-10-CM

## 2023-09-09 DIAGNOSIS — Z87891 Personal history of nicotine dependence: Secondary | ICD-10-CM | POA: Insufficient documentation

## 2023-09-09 DIAGNOSIS — K635 Polyp of colon: Secondary | ICD-10-CM | POA: Diagnosis not present

## 2023-09-09 DIAGNOSIS — I10 Essential (primary) hypertension: Secondary | ICD-10-CM | POA: Diagnosis not present

## 2023-09-09 DIAGNOSIS — K219 Gastro-esophageal reflux disease without esophagitis: Secondary | ICD-10-CM | POA: Diagnosis not present

## 2023-09-09 DIAGNOSIS — N189 Chronic kidney disease, unspecified: Secondary | ICD-10-CM | POA: Insufficient documentation

## 2023-09-09 DIAGNOSIS — J449 Chronic obstructive pulmonary disease, unspecified: Secondary | ICD-10-CM

## 2023-09-09 DIAGNOSIS — D125 Benign neoplasm of sigmoid colon: Secondary | ICD-10-CM | POA: Diagnosis not present

## 2023-09-09 HISTORY — PX: FLEXIBLE SIGMOIDOSCOPY: SHX5431

## 2023-09-09 HISTORY — PX: BIOPSY OF SKIN SUBCUTANEOUS TISSUE AND/OR MUCOUS MEMBRANE: SHX6741

## 2023-09-09 SURGERY — SIGMOIDOSCOPY, FLEXIBLE
Anesthesia: Monitor Anesthesia Care

## 2023-09-09 MED ORDER — LIDOCAINE 2% (20 MG/ML) 5 ML SYRINGE
INTRAMUSCULAR | Status: DC | PRN
  Administered 2023-09-09: 80 mg via INTRAVENOUS

## 2023-09-09 MED ORDER — PROPOFOL 500 MG/50ML IV EMUL
INTRAVENOUS | Status: DC | PRN
Start: 2023-09-09 — End: 2023-09-09
  Administered 2023-09-09: 75 ug/kg/min via INTRAVENOUS

## 2023-09-09 MED ORDER — SODIUM CHLORIDE 0.9 % IV SOLN
INTRAVENOUS | Status: AC | PRN
  Administered 2023-09-09: 500 mL via INTRAMUSCULAR

## 2023-09-09 MED ORDER — OXYCODONE HCL 5 MG/5ML PO SOLN: 5.0000 mg | Freq: Once | ORAL | Status: DC | PRN

## 2023-09-09 MED ORDER — MEPERIDINE HCL 50 MG/ML IJ SOLN: 6.2500 mg | INTRAMUSCULAR | Status: DC | PRN

## 2023-09-09 MED ORDER — PROPOFOL 10 MG/ML IV BOLUS
INTRAVENOUS | Status: DC | PRN
  Administered 2023-09-09: 30 mg via INTRAVENOUS

## 2023-09-09 MED ORDER — LACTATED RINGERS IV SOLN: INTRAVENOUS | Status: DC | PRN

## 2023-09-09 MED ORDER — ONDANSETRON HCL 4 MG/2ML IJ SOLN: 4.0000 mg | Freq: Once | INTRAMUSCULAR | Status: DC | PRN

## 2023-09-09 MED ORDER — SODIUM CHLORIDE 0.9 % IV SOLN: INTRAVENOUS | Status: DC

## 2023-09-09 MED ORDER — OXYCODONE HCL 5 MG PO TABS: 5.0000 mg | ORAL_TABLET | Freq: Once | ORAL | Status: DC | PRN

## 2023-09-09 MED ORDER — FENTANYL CITRATE (PF) 100 MCG/2ML IJ SOLN: 25.0000 ug | INTRAMUSCULAR | Status: DC | PRN

## 2023-09-09 NOTE — Anesthesia Postprocedure Evaluation (Signed)
 Anesthesia Post Note  Patient: Vanessa Ward  Procedure(s) Performed: SIGMOIDOSCOPY, FLEXIBLE BIOPSY, SKIN, SUBCUTANEOUS TISSUE, OR MUCOUS MEMBRANE     Patient location during evaluation: PACU Anesthesia Type: MAC Level of consciousness: awake and alert Pain management: pain level controlled Vital Signs Assessment: post-procedure vital signs reviewed and stable Respiratory status: spontaneous breathing, nonlabored ventilation, respiratory function stable and patient connected to nasal cannula oxygen Cardiovascular status: stable and blood pressure returned to baseline Postop Assessment: no apparent nausea or vomiting Anesthetic complications: no   No notable events documented.  Last Vitals:  Vitals:   09/09/23 1040 09/09/23 1050  BP: 124/75 130/68  Pulse: 64 67  Resp: 16 18  Temp:    SpO2: 100% 97%    Last Pain:  Vitals:   09/09/23 1050  TempSrc:   PainSc: 0-No pain                 Kady Toothaker

## 2023-09-09 NOTE — Transfer of Care (Signed)
 Immediate Anesthesia Transfer of Care Note  Patient: Vanessa Ward  Procedure(s) Performed: SIGMOIDOSCOPY, FLEXIBLE BIOPSY, SKIN, SUBCUTANEOUS TISSUE, OR MUCOUS MEMBRANE  Patient Location: PACU  Anesthesia Type:mac  Level of Consciousness: awake and alert   Airway & Oxygen Therapy: Patient Spontanous Breathing and Patient connected to nasal cannula oxygen  Post-op Assessment: Report given to RN and Post -op Vital signs reviewed and stable  Post vital signs: Reviewed and stable  Last Vitals:  Vitals Value Taken Time  BP 112/68 09/09/23 10:36  Temp 36.2 C 09/09/23 10:37  Pulse 61 09/09/23 10:37  Resp 16 09/09/23 10:37  SpO2 100 % 09/09/23 10:37  Vitals shown include unfiled device data.  Last Pain:  Vitals:   09/09/23 1037  TempSrc: Temporal  PainSc:       Patients Stated Pain Goal: 0 (09/09/23 0947)  Complications: No notable events documented.

## 2023-09-09 NOTE — H&P (Signed)
 Vanessa Ward HPI: During a recent colonoscopy with Dr. Kristie for further evaluation of a microperforation diverticulitis, she was noted to have a polypoid lesion in the sigmoid colon.  A cold biopsy only showed a mucosal prolapse polyp.  She is here today to further evaluate this area to determine if any further treatment is necessary.   Past Medical History:  Diagnosis Date   Arthritis    Chronic kidney disease    resent diagnosis of hematuria due to benigh kidney tumor   GERD (gastroesophageal reflux disease)    Hypertension    Shingles     Past Surgical History:  Procedure Laterality Date   BIOPSY  10/15/2011   Procedure: BIOPSY;  Surgeon: Olam Mill, MD;  Location: [facility];  Service: Gynecology;;  Cervical   CERVICAL CONIZATION W/BX  12/17/2011   Procedure: CONIZATION CERVIX WITH BIOPSY;  Surgeon: Olam Mill, MD;  Location: [facility];  Service: Gynecology;  Laterality: N/A;   COLPOSCOPY  10/15/2011   Procedure: COLPOSCOPY;  Surgeon: Olam Mill, MD;  Location: [facility];  Service: Gynecology;  Laterality: N/A;   DIAGNOSTIC LAPAROSCOPY     TUBAL LIGATION      Family History  Problem Relation Age of Onset   Hypertension Mother    Heart disease Mother    Hypertension Father    Diabetes Paternal Grandmother     Social History:  reports that she quit smoking about 9 years ago. Her smoking use included cigarettes and pipe. She started smoking about 58 years ago. She has a 24.5 pack-year smoking history. She has never used smokeless tobacco. She reports current alcohol use of about 2.0 standard drinks of alcohol per week. She reports that she does not use drugs.  Allergies:  Allergies  Allergen Reactions   Other     Perfume, scented lotion - flares up eczema    Medications: Scheduled: Continuous:  No results found for this or any previous visit (from the past 24 hours).   No results found.  ROS:  As stated above in the HPI otherwise negative.  Blood  pressure 95/75, temperature (!) 97.5 F (36.4 C), temperature source Temporal, resp. rate 17, height 5' 4 (1.626 m), weight 99.8 kg, SpO2 99%.    PE: Gen: NAD, Alert and Oriented HEENT:  [place]/AT, EOMI Neck: Supple, no LAD Lungs: CTA Bilaterally CV: RRR without M/G/R ABD: Soft, NTND, +BS Ext: No C/C/E  Assessment/Plan: 1) Sigmoid colon polyp - Flexible sigmoidoscopy.  Jacaden Forbush D 09/09/2023, 9:51 AM

## 2023-09-09 NOTE — Op Note (Signed)
 [facility] Patient Name: Vanessa Ward Procedure Date: 09/09/2023 MRN: [id number] Attending MD: Belvie Just , MD, [id number] Date of Birth: 1945-10-12 CSN: [id number] Age: 78 Admit Type: Outpatient Procedure:                Flexible Sigmoidoscopy Indications:              Polyps of uncertain behavior in the sigmoid colon Providers:                Belvie Just, MD, Gregoria Pierce, RN, Farris Southgate, Technician Referring MD:              Medicines:                Propofol  per Anesthesia Complications:            No immediate complications. Estimated Blood Loss:     Estimated blood loss: none. Procedure:                Pre-Anesthesia Assessment:                           - Prior to the procedure, a History and Physical                            was performed, and patient medications and                            allergies were reviewed. The patient's tolerance of                            previous anesthesia was also reviewed. The risks                            and benefits of the procedure and the sedation                            options and risks were discussed with the patient.                            All questions were answered, and informed consent                            was obtained. Prior Anticoagulants: The patient has                            taken no anticoagulant or antiplatelet agents. ASA                            Grade Assessment: II - A patient with mild systemic                            disease. After reviewing the risks and benefits,  the patient was deemed in satisfactory condition to                            undergo the procedure.                           - Sedation was administered by an anesthesia                            professional. Deep sedation was attained.                           After obtaining informed consent, the scope was                             passed under direct vision. The [id number] ([id number])                            Olympus endoscope was introduced through the anus                            and advanced to the the descending colon. The                            flexible sigmoidoscopy was somewhat difficult due                            to significant looping. Successful completion of                            the procedure was aided by straightening and                            shortening the scope to obtain bowel loop                            reduction. The quality of the bowel preparation was                            good. Scope In: 10:13:43 AM Scope Out: 10:26:52 AM Total Procedure Duration: 0 hours 13 minutes 9 seconds  Findings:      A 10 mm polyp was found in the sigmoid colon. The polyp was sessile.       Biopsies were taken with a cold forceps for histology.      Scattered large-mouthed and medium-mouthed diverticula were found in the       sigmoid colon and descending colon.      The procedure was difficult as she was not able to hold the       insufflation. Anal pressure was applied and this helped to keep the       lumen patent. The prior mucosal abnormality was again noted at       approximately 17 cm from the anal verge. The mucosa was roughened and       several cold biopsies were obtained. Impression:               -  One 10 mm polyp in the sigmoid colon. Biopsied.                           - Diverticulosis in the sigmoid colon and in the                            descending colon. Moderate Sedation:      Not Applicable - Patient had care per Anesthesia. Recommendation:           - Patient has a contact number available for                            emergencies. The signs and symptoms of potential                            delayed complications were discussed with the                            patient. Return to normal activities tomorrow.                            Written discharge  instructions were provided to the                            patient.                           - Resume regular diet.                           - Await pathology results. Procedure Code(s):        --- Professional ---                           [phone], Sigmoidoscopy, flexible; with biopsy, single                            or multiple Diagnosis Code(s):        --- Professional ---                           D12.5, Benign neoplasm of sigmoid colon                           D37.4, Neoplasm of uncertain behavior of colon                           K57.30, Diverticulosis of large intestine without                            perforation or abscess without bleeding CPT copyright 2022 American Medical Association. All rights reserved. The codes documented in this report are preliminary and upon coder review may  be revised to meet current compliance requirements. Belvie Just, MD Belvie Just, MD 09/09/2023 10:36:02 AM This report has been signed electronically. Number of Addenda: 0

## 2023-09-09 NOTE — Anesthesia Preprocedure Evaluation (Addendum)
 Anesthesia Evaluation  Patient identified by MRN, date of birth, ID band Patient awake    Reviewed: Allergy & Precautions, H&P , NPO status , Patient's Chart, lab work & pertinent test results  Airway Mallampati: III  TM Distance: >3 FB Neck ROM: Full    Dental no notable dental hx. (+) Teeth Intact, Dental Advisory Given   Pulmonary COPD, former smoker   Pulmonary exam normal breath sounds clear to auscultation       Cardiovascular Exercise Tolerance: Good hypertension, Pt. on medications Normal cardiovascular exam Rhythm:Regular Rate:Normal     Neuro/Psych  PSYCHIATRIC DISORDERS      negative neurological ROS  negative psych ROS   GI/Hepatic negative GI ROS, Neg liver ROS,GERD  Medicated,,  Endo/Other  negative endocrine ROS    Renal/GU Renal diseasenegative Renal ROS  negative genitourinary   Musculoskeletal negative musculoskeletal ROS (+) Arthritis ,    Abdominal   Peds negative pediatric ROS (+)  Hematology negative hematology ROS (+)   Anesthesia Other Findings   Reproductive/Obstetrics negative OB ROS                              Anesthesia Physical Anesthesia Plan  ASA: 3  Anesthesia Plan: MAC   Post-op Pain Management:    Induction: Intravenous  PONV Risk Score and Plan: 2 and Propofol  infusion  Airway Management Planned: Natural Airway and Simple Face Mask  Additional Equipment: None  Intra-op Plan:   Post-operative Plan:   Informed Consent: I have reviewed the patients History and Physical, chart, labs and discussed the procedure including the risks, benefits and alternatives for the proposed anesthesia with the patient or authorized representative who has indicated his/her understanding and acceptance.       Plan Discussed with: Anesthesiologist and CRNA  Anesthesia Plan Comments:          Anesthesia Quick Evaluation

## 2023-09-09 NOTE — Discharge Instructions (Signed)

## 2023-09-11 ENCOUNTER — Encounter (HOSPITAL_COMMUNITY): Payer: Self-pay | Admitting: Gastroenterology

## 2023-09-12 ENCOUNTER — Ambulatory Visit: Payer: Self-pay | Admitting: Internal Medicine

## 2023-09-14 LAB — SURGICAL PATHOLOGY

## 2023-09-26 ENCOUNTER — Other Ambulatory Visit: Payer: Self-pay | Admitting: Internal Medicine

## 2023-10-10 ENCOUNTER — Other Ambulatory Visit: Payer: Self-pay | Admitting: Internal Medicine

## 2023-10-10 DIAGNOSIS — Z1231 Encounter for screening mammogram for malignant neoplasm of breast: Secondary | ICD-10-CM

## 2023-11-11 ENCOUNTER — Ambulatory Visit
Admission: RE | Admit: 2023-11-11 | Discharge: 2023-11-11 | Disposition: A | Discharge status: Home / Self Care | Source: Ambulatory Visit | Attending: Internal Medicine | Admitting: Internal Medicine

## 2023-11-11 DIAGNOSIS — Z1231 Encounter for screening mammogram for malignant neoplasm of breast: Secondary | ICD-10-CM

## 2023-11-16 ENCOUNTER — Encounter: Admitting: Internal Medicine

## 2023-12-13 ENCOUNTER — Other Ambulatory Visit: Payer: Self-pay

## 2023-12-13 MED ORDER — ROSUVASTATIN CALCIUM 10 MG PO TABS: 10.0000 mg | ORAL_TABLET | Freq: Every day | ORAL | 2 refills | Status: AC

## 2023-12-19 ENCOUNTER — Other Ambulatory Visit: Payer: Self-pay | Admitting: Internal Medicine

## 2024-01-24 ENCOUNTER — Other Ambulatory Visit: Payer: Self-pay | Admitting: Internal Medicine

## 2024-01-24 DIAGNOSIS — K219 Gastro-esophageal reflux disease without esophagitis: Secondary | ICD-10-CM

## 2024-02-29 ENCOUNTER — Ambulatory Visit: Admitting: Family Medicine

## 2024-02-29 ENCOUNTER — Encounter: Payer: Self-pay | Admitting: Family Medicine

## 2024-02-29 VITALS — BP 120/84 | HR 70 | Temp 98.2°F | Ht 64.0 in | Wt 224.0 lb

## 2024-02-29 DIAGNOSIS — M25571 Pain in right ankle and joints of right foot: Secondary | ICD-10-CM

## 2024-02-29 DIAGNOSIS — M25562 Pain in left knee: Secondary | ICD-10-CM | POA: Diagnosis not present

## 2024-02-29 NOTE — Progress Notes (Signed)
 I,Vanessa Ward, CMA,acting as a neurosurgeon for Vanessa Lynch, NP.,have documented all relevant documentation on the behalf of Vanessa Creighton, NP,as directed by  Vanessa Creighton, NP while in the presence of Vanessa Creighton, NP.  Subjective:  Patient ID: Vanessa Ward , female    DOB: December 05, 1945 , 79 y.o.   MRN: [id number]  Chief Complaint  Patient presents with   Knee Pain    Patient presents today for left knee pain and right ankle pain. Patient reports her knee started hurting last week. She denies having any swelling. Patient reports she gets a tingling sensation in her right ankle when she gets up.     HPI Discussed the use of AI scribe software for clinical note transcription with the patient, who gave verbal consent to proceed.  History of Present Illness     [name] is a 79 year old female with arthritis who presents with severe left knee pain.  She has been experiencing severe left knee pain for almost a week, which has been intense enough to disrupt her sleep. The pain, previously rated as a 10 out of 10, has decreased to a 3 out of 10 today. There is no history of recent trauma or falls. She has been using Tylenol  and various creams for relief, but these have not been effective. She also uses a sleeve sock for support. The pain can feel hot, but there is no redness or swelling in the knee.  In addition to the knee pain, she reports right ankle pain, which she attributes to possibly leaning more on her right side due to the knee pain. She uses a support device for her ankle, which she had purchased years ago for a similar issue. She describes a shooting pain when she first stands up, requiring her to use a cane initially in the morning.  She also mentions a history of shoulder pain for which she received shots and therapy in the past, but these did not provide relief. She experiences difficulty lifting her left arm. She cannot take ibuprofen, and Tylenol  and heat pads have not been  effective.  She takes vitamin D  supplements due to a deficiency but does not take calcium  supplements. She has a history of iron deficiency but is not currently taking iron supplements.     Past Medical History:  Diagnosis Date   Arthritis    Chronic kidney disease    resent diagnosis of hematuria due to benigh kidney tumor   GERD (gastroesophageal reflux disease)    Hypertension    Shingles      Family History  Problem Relation Age of Onset   Hypertension Mother    Heart disease Mother    Hypertension Father    Diabetes Paternal Grandmother    Breast cancer Neg Hx     Current Medications[1]   Allergies[2]   Review of Systems  Respiratory: Negative.    Cardiovascular: Negative.   Gastrointestinal: Negative.   Musculoskeletal:  Positive for arthralgias and gait problem.       States she has been using her late mother's cane  Skin: Negative.   Psychiatric/Behavioral: Negative.       Today's Vitals   02/29/24 0933  BP: 120/84  Pulse: 70  Temp: 98.2 F (36.8 C)  TempSrc: Oral  Weight: 224 lb (101.6 kg)  Height: 5' 4 (1.626 m)  PainSc: 3   PainLoc: Knee   Body mass index is 38.45 kg/m.  Wt Readings from Last 3 Encounters:  02/29/24 224 lb (101.6 kg)  09/09/23 220 lb (99.8 kg)  07/01/23 223 lb (101.2 kg)    The 10-year ASCVD risk score (Arnett DK, et al., 2019) is: 13.4%   Values used to calculate the score:     Age: 67 years     Clinically relevant sex: Female     Is Non-Hispanic African American: Yes     Diabetic: No     Tobacco smoker: No     Systolic Blood Pressure: 120 mmHg     Is BP treated: Yes     HDL Cholesterol: 59 mg/dL     Total Cholesterol: 162 mg/dL  Objective:  Physical Exam Constitutional:      Appearance: Normal appearance.  HENT:     Head: Normocephalic.  Cardiovascular:     Rate and Rhythm: Normal rate and regular rhythm.     Pulses: Normal pulses.     Heart sounds: Normal heart sounds.  Pulmonary:     Effort: Pulmonary  effort is normal.     Breath sounds: Normal breath sounds.  Abdominal:     General: Bowel sounds are normal.  Neurological:     General: No focal deficit present.     Mental Status: She is alert and oriented to person, place, and time.         Assessment And Plan:   Assessment & Plan Acute pain of left knee Severe acute left knee pain likely due to osteoarthritis exacerbation. Pain reduced from 10/10 to 3/10. No recent trauma. Previous treatments ineffective. - Referred to Dr. Delinda Davis at [facility] for further evaluation and management. - Advised use of Tylenol  arthritis until orthopedic evaluation. - Instructed to use a cane for support when walking. - Discussed potential for x-ray or MRI to assess knee condition. Acute right ankle pain Right ankle pain possibly due to altered gait from left knee pain. Pain occurs on initial weight-bearing, relieved by sock board. - Continue using sock board for support. - Monitor symptoms and report any changes to orthopedic specialist. Morbid obesity (HCC) She is encouraged to strive for BMI less than 30 to decrease cardiac risk. Advised to aim for at least 150 minutes of exercise per week.   Orders Placed This Encounter  Procedures   AMB referral to orthopedics      Return if symptoms worsen or fail to improve.  Patient was given opportunity to ask questions. Patient verbalized understanding of the plan and was able to repeat key elements of the plan. All questions were answered to their satisfaction.    I, Vanessa Creighton, NP, have reviewed all documentation for this visit. The documentation on 02/29/2024 for the exam, diagnosis, procedures, and orders are all accurate and complete.    IF YOU HAVE BEEN REFERRED TO A SPECIALIST, IT MAY TAKE 1-2 WEEKS TO SCHEDULE/PROCESS THE REFERRAL. IF YOU HAVE NOT HEARD FROM US /SPECIALIST IN TWO WEEKS, PLEASE GIVE US  A CALL AT [phone].      [1]  Current Outpatient  Medications:    albuterol  (VENTOLIN  HFA) 108 (90 Base) MCG/ACT inhaler, Inhale 2 puffs into the lungs every 4 (four) hours as needed for wheezing or shortness of breath., Disp: 8 g, Rfl: 6   aspirin  EC 81 MG tablet, Take 81 mg by mouth daily., Disp: , Rfl:    Cholecalciferol (DIALYVITE VITAMIN D  5000 PO), Take 5,000 Units by mouth daily., Disp: , Rfl:    EQ LORATADINE  10 MG tablet, TAKE 1 TABLET BY MOUTH ONCE DAILY  AS NEEDED FOR ALLERGIES, Disp: 30 tablet, Rfl: 0   lisinopril -hydrochlorothiazide  (ZESTORETIC ) 20-25 MG tablet, Take 1 tablet by mouth once daily, Disp: 90 tablet, Rfl: 0   omeprazole  (PRILOSEC) 40 MG capsule, Take 1 capsule by mouth once daily, Disp: 90 capsule, Rfl: 0   rosuvastatin  (CRESTOR ) 10 MG tablet, Take 1 tablet (10 mg total) by mouth daily., Disp: 90 tablet, Rfl: 2 [2]  Allergies Allergen Reactions   Other     Perfume, scented lotion - flares up eczema

## 2024-03-22 ENCOUNTER — Other Ambulatory Visit: Payer: Self-pay | Admitting: Internal Medicine

## 2024-06-20 ENCOUNTER — Ambulatory Visit: Payer: Self-pay

## 2024-06-20 ENCOUNTER — Ambulatory Visit: Payer: Self-pay | Admitting: Internal Medicine
